# Patient Record
Sex: Female | Born: 1940 | Race: White | Hispanic: No | Marital: Married | Smoking: Never smoker
Health system: Southern US, Community
[De-identification: ages and names within clinical notes are randomized; demographics above are authoritative.]

## PROBLEM LIST (undated history)

## (undated) DIAGNOSIS — T8859XA Other complications of anesthesia, initial encounter: Secondary | ICD-10-CM

## (undated) DIAGNOSIS — A809 Acute poliomyelitis, unspecified: Secondary | ICD-10-CM

## (undated) DIAGNOSIS — E785 Hyperlipidemia, unspecified: Secondary | ICD-10-CM

## (undated) DIAGNOSIS — K5790 Diverticulosis of intestine, part unspecified, without perforation or abscess without bleeding: Secondary | ICD-10-CM

## (undated) DIAGNOSIS — H269 Unspecified cataract: Secondary | ICD-10-CM

## (undated) DIAGNOSIS — Z23 Encounter for immunization: Secondary | ICD-10-CM

## (undated) DIAGNOSIS — G4730 Sleep apnea, unspecified: Secondary | ICD-10-CM

## (undated) DIAGNOSIS — Z8489 Family history of other specified conditions: Secondary | ICD-10-CM

## (undated) DIAGNOSIS — K648 Other hemorrhoids: Secondary | ICD-10-CM

## (undated) DIAGNOSIS — K759 Inflammatory liver disease, unspecified: Secondary | ICD-10-CM

## (undated) DIAGNOSIS — M797 Fibromyalgia: Secondary | ICD-10-CM

## (undated) DIAGNOSIS — M1990 Unspecified osteoarthritis, unspecified site: Secondary | ICD-10-CM

## (undated) DIAGNOSIS — K219 Gastro-esophageal reflux disease without esophagitis: Secondary | ICD-10-CM

## (undated) DIAGNOSIS — E079 Disorder of thyroid, unspecified: Secondary | ICD-10-CM

## (undated) DIAGNOSIS — J189 Pneumonia, unspecified organism: Secondary | ICD-10-CM

## (undated) DIAGNOSIS — IMO0002 Reserved for concepts with insufficient information to code with codable children: Secondary | ICD-10-CM

## (undated) DIAGNOSIS — I2510 Atherosclerotic heart disease of native coronary artery without angina pectoris: Secondary | ICD-10-CM

## (undated) DIAGNOSIS — I959 Hypotension, unspecified: Secondary | ICD-10-CM

## (undated) DIAGNOSIS — G14 Postpolio syndrome: Secondary | ICD-10-CM

## (undated) DIAGNOSIS — G709 Myoneural disorder, unspecified: Secondary | ICD-10-CM

## (undated) DIAGNOSIS — F32A Depression, unspecified: Secondary | ICD-10-CM

## (undated) DIAGNOSIS — K529 Noninfective gastroenteritis and colitis, unspecified: Secondary | ICD-10-CM

## (undated) DIAGNOSIS — F329 Major depressive disorder, single episode, unspecified: Secondary | ICD-10-CM

## (undated) DIAGNOSIS — R55 Syncope and collapse: Secondary | ICD-10-CM

## (undated) DIAGNOSIS — I10 Essential (primary) hypertension: Secondary | ICD-10-CM

## (undated) DIAGNOSIS — Z5189 Encounter for other specified aftercare: Secondary | ICD-10-CM

## (undated) HISTORY — DX: Essential (primary) hypertension: I10

## (undated) HISTORY — PX: ABDOMINAL HYSTERECTOMY: SHX81

## (undated) HISTORY — PX: CHOLECYSTECTOMY: SHX55

## (undated) HISTORY — DX: Encounter for immunization: Z23

## (undated) HISTORY — DX: Unspecified cataract: H26.9

## (undated) HISTORY — PX: APPENDECTOMY: SHX54

## (undated) HISTORY — DX: Unspecified osteoarthritis, unspecified site: M19.90

## (undated) HISTORY — DX: Fibromyalgia: M79.7

## (undated) HISTORY — DX: Noninfective gastroenteritis and colitis, unspecified: K52.9

## (undated) HISTORY — DX: Myoneural disorder, unspecified: G70.9

## (undated) HISTORY — DX: Other hemorrhoids: K64.8

## (undated) HISTORY — DX: Syncope and collapse: R55

## (undated) HISTORY — DX: Diverticulosis of intestine, part unspecified, without perforation or abscess without bleeding: K57.90

## (undated) HISTORY — DX: Disorder of thyroid, unspecified: E07.9

## (undated) HISTORY — DX: Acute poliomyelitis, unspecified: A80.9

## (undated) HISTORY — DX: Gastro-esophageal reflux disease without esophagitis: K21.9

## (undated) HISTORY — DX: Hyperlipidemia, unspecified: E78.5

## (undated) HISTORY — DX: Depression, unspecified: F32.A

## (undated) HISTORY — PX: EYE SURGERY: SHX253

## (undated) HISTORY — DX: Reserved for concepts with insufficient information to code with codable children: IMO0002

## (undated) HISTORY — DX: Major depressive disorder, single episode, unspecified: F32.9

## (undated) HISTORY — DX: Encounter for other specified aftercare: Z51.89

---
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
## 2000-05-29 ENCOUNTER — Encounter: Payer: Self-pay | Admitting: Neurology

## 2000-05-29 ENCOUNTER — Ambulatory Visit (HOSPITAL_COMMUNITY): Admission: RE | Admit: 2000-05-29 | Discharge: 2000-05-29 | Payer: Self-pay | Admitting: Neurology

## 2000-06-11 ENCOUNTER — Encounter: Admission: RE | Admit: 2000-06-11 | Discharge: 2000-07-10 | Payer: Self-pay | Admitting: Neurology

## 2002-10-05 DIAGNOSIS — Z23 Encounter for immunization: Secondary | ICD-10-CM

## 2002-10-05 HISTORY — DX: Encounter for immunization: Z23

## 2002-10-06 ENCOUNTER — Emergency Department (HOSPITAL_COMMUNITY): Admission: EM | Admit: 2002-10-06 | Discharge: 2002-10-06 | Payer: Self-pay | Admitting: Emergency Medicine

## 2003-10-26 ENCOUNTER — Other Ambulatory Visit: Admission: RE | Admit: 2003-10-26 | Discharge: 2003-10-26 | Payer: Self-pay | Admitting: Family Medicine

## 2008-02-10 DIAGNOSIS — Z23 Encounter for immunization: Secondary | ICD-10-CM

## 2008-02-10 HISTORY — DX: Encounter for immunization: Z23

## 2009-01-02 DIAGNOSIS — Z23 Encounter for immunization: Secondary | ICD-10-CM

## 2009-01-02 HISTORY — DX: Encounter for immunization: Z23

## 2009-07-07 ENCOUNTER — Encounter: Admission: RE | Admit: 2009-07-07 | Discharge: 2009-07-07 | Payer: Self-pay | Admitting: Family Medicine

## 2009-10-07 ENCOUNTER — Ambulatory Visit: Payer: Self-pay | Admitting: Internal Medicine

## 2009-10-07 DIAGNOSIS — G4733 Obstructive sleep apnea (adult) (pediatric): Secondary | ICD-10-CM | POA: Insufficient documentation

## 2009-10-07 DIAGNOSIS — B91 Sequelae of poliomyelitis: Secondary | ICD-10-CM | POA: Insufficient documentation

## 2009-10-07 DIAGNOSIS — I10 Essential (primary) hypertension: Secondary | ICD-10-CM | POA: Insufficient documentation

## 2009-10-07 DIAGNOSIS — R05 Cough: Secondary | ICD-10-CM

## 2009-10-07 DIAGNOSIS — K219 Gastro-esophageal reflux disease without esophagitis: Secondary | ICD-10-CM | POA: Insufficient documentation

## 2009-10-07 DIAGNOSIS — R059 Cough, unspecified: Secondary | ICD-10-CM | POA: Insufficient documentation

## 2009-10-07 DIAGNOSIS — M810 Age-related osteoporosis without current pathological fracture: Secondary | ICD-10-CM | POA: Insufficient documentation

## 2009-10-11 ENCOUNTER — Telehealth (INDEPENDENT_AMBULATORY_CARE_PROVIDER_SITE_OTHER): Payer: Self-pay | Admitting: *Deleted

## 2009-12-10 ENCOUNTER — Ambulatory Visit: Payer: Self-pay | Admitting: Internal Medicine

## 2010-01-18 ENCOUNTER — Ambulatory Visit: Payer: Self-pay | Admitting: Internal Medicine

## 2010-01-21 LAB — CONVERTED CEMR LAB: IgE (Immunoglobulin E), Serum: 6.8 intl units/mL (ref 0.0–180.0)

## 2010-01-26 ENCOUNTER — Ambulatory Visit: Payer: Self-pay | Admitting: Internal Medicine

## 2010-02-16 ENCOUNTER — Ambulatory Visit: Payer: Self-pay | Admitting: Internal Medicine

## 2010-10-04 NOTE — Assessment & Plan Note (Signed)
Summary: Pulmonary/  f/u cough try neurontin    Copy to:  Dr. Elias Else Primary Provider/Referring Provider:  Dr. Azucena Cecil  CC:  Pt here for follow up with PFT. Pt states breathing is better. Pt c/o increased PND x 3 to 4 weeks. Pt c/o having to get up 2 to 3 times a night due to PND and usually takes up to 30 minutes every morning to clear all PND.  History of Present Illness: 33 yowf never smoker with no h/o any sign upper or lower resp problems until  new onset "allergies" x 2004 consisting on sensation of constant drainage eval by [facility] [place] and found to be allergic to dust and mold and rx with multipe meds including systemic steroids with best longterm parital response to  rhinocort and astelin.  October 07, 2009 initial pulmonary eval  cc much worse x 1 year with sensation of post nasal drainage with recent neg ENT wu but now worse  to point of choking and lost voice completely 1 week prior to ov.  no better with rhinocort or astepro now. assoc with dry cough and mild sore throat/ dysphagia. rec Stop lisinopril and triamterenehctz and take 1 diovan 160/112.5 daily  in it's place Acid reflux is a  leading suspect here and needs to be eliminated  completely before considering additional studies or treatment options. To suppress this maximally, take prilosec otc before  meal and pepcid 20 mg (otc) at bedtime plus diet measures as listed but it's ok to stop the acid preventers when 100% if not convinced you're 100% by the time you finish diovan return here otherwise see Dr Nicholos Johns. Prednisone 10 mg 4 each am x 2days, 2x2days, 1x2days and stop  December 10, 2009 cc Followup dyspnea. Pt states that her breathing is much better since last seen. She states that she sometimes still gets some SOB with exertion- sometimes without any exertion.  She also also PND that wakes her up in the night.  Hoarseness much better also. Prilosec 20 mg before first and last meal and pepcid 20 mg (otc) at bedtime plus  diet measures as listed.  To treat drainage, the best medication is chlortrimeton 4mg  one 6hours as neded   use rhinocort every night whether you think you need it or not   Jan 18, 2010 Pt here for follow up with PFT. Pt states breathing is better.  still pnd hs mostly. no discolored mucus. was already told all that was left to offer was sinus surgery and declined. Pt denies any significant sore throat, dysphagia, itching, sneezing.  Current Medications (verified): 1)  Levothroid 112 Mcg Tabs (Levothyroxine Sodium) .Marland Kitchen.. 1 Once Daily 2)  Caltrate 600+d 600-400 Mg-Unit Tabs (Calcium Carbonate-Vitamin D) .Marland Kitchen.. 1 Once Daily 3)  Diovan Hct 160-12.5 Mg  Tabs (Valsartan-Hydrochlorothiazide) .... One By Mouth Daily 4)  Prilosec Otc 20 Mg Tbec (Omeprazole Magnesium) .... Take One 30-60 Min Before First and Last Meals of The Day 5)  Advil 200 Mg Tabs (Ibuprofen) .... As Directed As Needed 6)  Sertraline Hcl 50 Mg Tabs (Sertraline Hcl) .Marland Kitchen.. 1 Once Daily 7)  Pepcid 20 Mg Tabs (Famotidine) .... Take 1 Tab By Mouth At Bedtime 8)  Rhinocort Aqua 32 Mcg/act Susp (Budesonide) .... One Spray Each Nostril Twice Daily 9)  Astelin 137 Mcg/spray Soln (Azelastine Hcl) .... One Spray in Each Nostril Twice Daily 10)  Aspirin 81 Mg  Tabs (Aspirin) .... Take One Tablet By Mouth Every Other Day (3 Times A  Week)  Allergies (verified): 1)  ! Cipro 2)  ! * Ivp Dye 3)  ! * Claritin D 4)  ! 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) OSTEOPOROSIS (ICD-733.00) HYPERTENSION (ICD-401.9)     - On lisinopril since 2004 >  d/c October 07, 2009 > much better December 11, 2009  G E R D (ICD-530.81)......................................................Marland KitchenMarcy Panning       - EGD positive 2010   Vital Signs:  Patient profile:   70 year old female Height:      64 inches Weight:      141 pounds O2 Sat:      98 % on Room air Temp:     98.0 degrees F  oral Pulse rate:   77 / minute BP sitting:   150 / 88  (left arm) Cuff size:   regular  Vitals Entered By: Zackery Barefoot CMA (Jan 18, 2010 9:44 AM)  O2 Flow:  Room air CC: Pt here for follow up with PFT. Pt states breathing is better. Pt c/o increased PND x 3 to 4 weeks. Pt c/o having to get up 2 to 3 times a night due to PND and usually takes up to 30 minutes every morning to clear all PND Comments Medications reviewed with patient Verified contact number and pharmacy with patient Zackery Barefoot CMA  Jan 18, 2010 9:44 AM    Physical Exam  Additional Exam:   amb wf nad  no longer with classic pseudowheeze, minimal hoarseness HEENT: nl dentition, turbinates, and orophanx. Nl external ear canals without cough reflex NECK :  without JVD/Nodes/TM/ nl carotid upstrokes bilaterally LUNGS: no acc muscle use, clear to A and P bilaterally without cough on insp or exp maneuvers CV:  RRR  no s3 or murmur or increase in P2, no edema   ABD:  soft and nontender with nl excursion in the supine position. No bruits or organomegaly, bowel sounds nl MS:  warm without deformities, calf tenderness, cyanosis or clubbing      Impression & Recommendations:  Problem # 1:  COUGH (ICD-786.2) Much better off ace is strongly suggestive of  Upper airway cough syndrome, so named because it's frequently impossible to sort out how much is LPR vs CR/sinusitis with freq throat clearing generating secondary extra esophageal GERD from wide swings in gastric pressure that occur with throat clearing, promoting self use of mint and menthol lozenges that reduce the lower esophageal sphincter tone and exacerbate the problem further.  These symptoms are easily confused with asthma/copd by even experienced pulmonogists because they overlap so much. These are the same pts who not infrequently have failed to tolerate ace inhibitors,  dry powder inhalers or biphosphonates or report having reflux symptoms that don't respond to  standard doses of PPI.  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  I had an extended discussion with the patient today lasting 15 to 20 minutes of a 25 minute visit on the following issues:  The standardized cough guidelines recently published in Chest are a 14 step process, not a single office visit,  and are intended  to address this problem logically,  with an alogrithm dependent on response to each progressive step  to determine a specific diagnosis with  minimal addtional testing needed. Therefore if compliance is an issue this empiric standardized approach  simply won't work.   Each maintenance medication was reviewed in detail including most importantly the difference between maintenance and as needed and under what circumstances the prns are to be used. See instructions for specific recommendations   Medications Added to Medication List This Visit: 1)  Pepcid 20 Mg Tabs (Famotidine) .... Take 1 tab by mouth at bedtime 2)  Rhinocort Aqua 32 Mcg/act Susp (Budesonide) .... One spray each nostril twice daily 3)  Astelin 137 Mcg/spray Soln (Azelastine hcl) .... One spray in each nostril twice daily 4)  Aspirin 81 Mg Tabs (Aspirin) .... Take one tablet by mouth every other day (3 times a week) 5)  Neurontin 400 Mg Caps (Gabapentin) .... One in evening  Other Orders: [facility]-All[facility] Profile [facility], [place], [place], [place], [place] [phone]) Misc. Referral (Misc. Ref) Prescription Created Electronically [phone]) Est. Patient Level IV (40981)  Patient Instructions: 1)  Try neurontin 400 mg each evening 2)  No change in any of your other medication 3)  Please schedule a follow-up appointment in 4weeks, sooner if needed  4)  See Patient Care Coordinator before leaving for sinus ct Prescriptions: NEURONTIN 400 MG CAPS (GABAPENTIN) one in evening  #30 x 11   Entered and Authorized by:    Nyoka Cowden MD   Signed by:   Nyoka Cowden MD on 01/18/2010   Method used:   Electronically to        [facility]* (retail)       [address] [place], [place]  [address]       Ph: [phone] or [phone]       Fax: [phone]   RxID:   [id number]    Immunization History:  Influenza Immunization History:    Influenza:  historical (06/07/2009)

## 2010-10-04 NOTE — Assessment & Plan Note (Signed)
Summary: Pulmonary/ ext f/u ov for cough   Copy to:  Dr. Elias Else Primary Provider/Referring Provider:  Dr. Azucena Cecil  CC:  4 wk followup.  Pt states that her cough has improved some- not coughing quite as often.  She still has "harsh cough" at times- non prod.  No new complaints today.  She is only taking prilosec and pepcid prn because she states that it does not make any difference.Marland Kitchen  History of Present Illness: 32 yowf never smoker with no h/o any sign upper or lower resp problems until  new onset "allergies" x 2004 consisting on sensation of constant drainage eval by Gene [place] and found to be allergic to dust and mold and rx with multipe meds including systemic steroids with best longterm parital response to  rhinocort and astelin.  October 07, 2009 initial pulmonary eval  cc much worse x 1 year with sensation of post nasal drainage with recent neg ENT wu but now worse  to point of choking and lost voice completely 1 week prior to ov.  no better with rhinocort or astepro now. assoc with dry cough and mild sore throat/ dysphagia. rec Stop lisinopril and triamterenehctz and take 1 diovan 160/112.5 daily  in it's place Acid reflux is a  leading suspect here and needs to be eliminated  completely before considering additional studies or treatment options. To suppress this maximally, take prilosec otc before  meal and pepcid 20 mg (otc) at bedtime plus diet measures as listed but it's ok to stop the acid preventers when 100% if not convinced you're 100% by the time you finish diovan return here otherwise see Dr Nicholos Johns. Prednisone 10 mg 4 each am x 2days, 2x2days, 1x2days and stop  December 10, 2009 cc Followup dyspnea. Pt states that her breathing is much better since last seen. She states that she sometimes still gets some SOB with exertion- sometimes without any exertion.  She also also PND that wakes her up in the night.  Hoarseness much better also. Prilosec 20 mg before first and last meal and  pepcid 20 mg (otc) at bedtime plus diet measures as listed.  To treat drainage, the best medication is chlortrimeton 4mg  one 6hours as neded   use rhinocort every night whether you think you need it or not   Jan 18, 2010 Pt here for follow up with PFT.  rec try neurontin couldn't take it due to funny in head  February 16, 2010 4 wk followup.  Pt states that her cough has improved some- not coughing quite as often.  She still has "harsh cough" at times- non prod.  No new c/os.   Cough occurs about hour and half after lie down now waiting until it occurs to take rhinocort. not compliant with previous rec.  Pt denies any significant sore throat, dysphagia, itching, sneezing,    fever, chills, sweats, unintended wt loss, pleuritic or exertional cp, hempoptysis, change in activity tolerance  orthopnea pnd or leg swelling .  Pt also denies any obvious fluctuation in symptoms with weather or environmental change or other alleviating or aggravating factors.       Current Medications (verified): 1)  Levothroid 112 Mcg Tabs (Levothyroxine Sodium) .Marland Kitchen.. 1 Once Daily 2)  Caltrate 600+d 600-400 Mg-Unit Tabs (Calcium Carbonate-Vitamin D) .Marland Kitchen.. 1 Once Daily 3)  Diovan Hct 160-12.5 Mg  Tabs (Valsartan-Hydrochlorothiazide) .... One By Mouth Daily 4)  Prilosec Otc 20 Mg Tbec (Omeprazole Magnesium) .... Take One 30-60 Min Before First and  Last Meals of The Day As Needed 5)  Sertraline Hcl 50 Mg Tabs (Sertraline Hcl) .Marland Kitchen.. 1 Once Daily 6)  Pepcid 20 Mg Tabs (Famotidine) .... Take 1 Tab By Mouth At Bedtime As Needed 7)  Rhinocort Aqua 32 Mcg/act Susp (Budesonide) .... One Spray Each Nostril Twice Daily 8)  Astelin 137 Mcg/spray Soln (Azelastine Hcl) .... One Spray in Each Nostril Twice Daily 9)  Aspirin 81 Mg  Tabs (Aspirin) .... Take One Tablet By Mouth Every Other Day (3 Times A Week) 10)  Advil 200 Mg Tabs (Ibuprofen) .... As Directed As Needed  Allergies (verified): 1)  ! Cipro 2)  ! * Ivp Dye 3)  ! * Claritin  D 4)  ! Prednisone  Vital Signs:  Patient profile:   70 year old female Weight:      140 pounds O2 Sat:      99 % on Room air Temp:     97.7 degrees F oral Pulse rate:   75 / minute BP sitting:   130 / 78  (left arm)  Vitals Entered By: Vernie Murders (February 16, 2010 10:07 AM)  O2 Flow:  Room air CC: 4 wk followup.  Pt states that her cough has improved some- not coughing quite as often.  She still has "harsh cough" at times- non prod.  No new complaints today.  She is only taking prilosec and pepcid prn because she states that it does not make any difference.   Physical Exam  Additional Exam:   amb wf nad  no longer with classic pseudowheeze, minimal hoarseness wt 141 > 140 February 17, 2010  HEENT: nl dentition, turbinates, and orophanx. Nl external ear canals without cough reflex NECK :  without JVD/Nodes/TM/ nl carotid upstrokes bilaterally LUNGS: no acc muscle use, clear to A and P bilaterally without cough on insp or exp maneuvers CV:  RRR  no s3 or murmur or increase in P2, no edema   ABD:  soft and nontender with nl excursion in the supine position. No bruits or organomegaly, bowel sounds nl MS:  warm without deformities, calf tenderness, cyanosis or clubbing      Impression & Recommendations:  Problem # 1:  COUGH (ICD-786.2)  The most common causes of chronic cough in immunocompetent adults include: upper airway cough syndrome (UACS), previously referred to as postnasal drip syndrome,  caused by variety of rhinosinus conditions; (2) asthma; (3) GERD; (4) chronic bronchitis from cigarette smoking or other inhaled environmental irritants; (5) nonasthmatic eosinophilic bronchitis; and (6) bronchiectasis. These conditions, singly or in combination, have accounted for up to 94% of the causes of chronic cough in prospective studies.    I had an extended discussion with the patient today lasting 15 to 20 minutes of a 25 minute visit on the following issues:   The standardized  cough guidelines recently published in Chest are a 14 step process, not a single office visit,  and are intended  to address this problem logically,  with an alogrithm dependent on response to each progressive step  to determine a specific diagnosis with  minimal addtional testing needed. Therefore if compliance is an issue this empiric standardized approach simply won't work.   Need to regroup re aggressive rx directed at uacs.  See instructions for specific recommendations   Orders: Est. Patient Level IV (14782)  Medications Added to Medication List This Visit: 1)  Prilosec Otc 20 Mg Tbec (Omeprazole magnesium) .... Take one 30-60 min before first and last meals of  the day as needed 2)  Pepcid 20 Mg Tabs (Famotidine) .... Take 1 tab by mouth at bedtime as needed 3)  Medrol 8 Mg Tabs (Methylprednisolone) .... 4 each am x 2days, 2x2days, 1x2days and stop 4)  Neurontin 100 Mg Caps (Gabapentin) .... One twice daily  Patient Instructions: 1)  Restart Prilosec 20 mg Take one 30-60 min before first and last meals of the day and pepcid at bedime along with chlortrimeton 4 mg at bedtime 2)  Add neurontin 100 mg twice daily 3)  Medrol x 6 days only 4)  Continue Astelin at bedtime and astelin at bedtime 5)  Please schedule a follow-up appointment in 4 weeks, sooner if needed  6)  Unlike when you get a prescription for eyeglasses, it's not possible to always walk out of this or any medical office with a perfect prescription that is immediately effective  based on any test that we offer here.  On the contrary, it may take several weeks for the full impact of changes recommened today - hopefully you will respond well.  If not, then we'll adjust your medication on your next visit accordingly, knowing more then than we can possibly know now.    Prescriptions: NEURONTIN 100 MG CAPS (GABAPENTIN) one twice daily  #60 x 0   Entered and Authorized by:   Nyoka Cowden MD   Signed by:   Nyoka Cowden MD on  02/16/2010   Method used:   Electronically to        [facility]* (retail)       [address] [place], [place]  [address]       Ph: [phone] or [phone]       Fax: [phone]   RxID:   [id number] MEDROL 8 MG TABS (METHYLPREDNISOLONE) 4 each am x 2days, 2x2days, 1x2days and stop  #14 x 0   Entered and Authorized by:   Nyoka Cowden MD   Signed by:   Nyoka Cowden MD on 02/16/2010   Method used:   Electronically to        [facility]* (retail)       [address] [place], [place]  [address]       Ph: [phone] or [phone]       Fax: [phone]   RxID:   [id number]

## 2010-10-04 NOTE — Assessment & Plan Note (Signed)
Summary: Pulmonary/ ext f/u ov   Copy to:  Dr. Elias Else Primary Provider/Referring Provider:  Dr. Azucena Cecil  CC:  Followup dyspnea. Pt states that her breathing is much better since last seen. She states that she sometimes still gets some SOB with exertion- sometimes without any exertion.  She also also PND that wakes her up in the night.  .  History of Present Illness: 56 yowf never smoker with no h/o any sign upper or lower resp problmes until  new onset "allergies" x 2004 consisting on sensation of constant drainage eval by Gene [place] and found to be allergic to dust and mold and rx with multipe meds including systemic steroids with best longterm parital response to  rhinocort and astelin.  October 07, 2009 initial pulmonary eval  cc much worse x 1 year with sensation of post nasal drainage with recent neg ENT wu but now worse  to point of choking and lost voice completely 1 week prior to ov.  no better with rhinocort or astepro now. assoc with dry cough and mild sore throat/ dysphagia. rec Stop lisinopril and triamterenehctz and take 1 diovan 160/112.5 daily  in it's place Acid reflux is a  leading suspect here and needs to be eliminated  completely before considering additional studies or treatment options. To suppress this maximally, take prilosec otc before  meal and pepcid 20 mg (otc) at bedtime plus diet measures as listed but it's ok to stop the acid preventers when 100% if not convinced you're 100% by the time you finish diovan return here otherwise see Dr Nicholos Johns. Prednisone 10 mg 4 each am x 2days, 2x2days, 1x2days and stop  December 10, 2009 cc Followup dyspnea. Pt states that her breathing is much better since last seen. She states that she sometimes still gets some SOB with exertion- sometimes without any exertion.  She also also PND that wakes her up in the night.  Hoarseness much better also.  Pt denies any significant sore throat, dysphagia, itching, sneezing,   fever, chills,  sweats, unintended wt loss, pleuritic or exertional cp, hempoptysis, change in activity tolerance  orthopnea pnd or leg swelling.  Pt also denies any obvious fluctuation in symptoms with weather or environmental change or other alleviating or aggravating factors.       Current Medications (verified): 1)  Levothroid 112 Mcg Tabs (Levothyroxine Sodium) .Marland Kitchen.. 1 Once Daily 2)  Caltrate 600+d 600-400 Mg-Unit Tabs (Calcium Carbonate-Vitamin D) .Marland Kitchen.. 1 Once Daily 3)  Alprazolam 0.5 Mg Tabs (Alprazolam) .... 1/2 To 1 At Bedtime As Needed 4)  Diovan Hct 160-12.5 Mg  Tabs (Valsartan-Hydrochlorothiazide) .... One By Mouth Daily 5)  Prilosec Otc 20 Mg Tbec (Omeprazole Magnesium) .... Take  One 30-60 Min Before First Meal of The Day 6)  Advil 200 Mg Tabs (Ibuprofen) .... As Directed As Needed 7)  Sertraline Hcl 50 Mg Tabs (Sertraline Hcl) .Marland Kitchen.. 1 Once Daily  Allergies (verified): 1)  ! Cipro 2)  ! * Ivp Dye 3)  ! 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 Nl external ear canals without cough reflex NECK :  without JVD/Nodes/TM/ nl carotid upstrokes bilaterally LUNGS: no acc muscle use, clear to A and P bilaterally without cough on insp or exp maneuvers CV:  RRR  no s3 or murmur or increase in P2, no edema   ABD:  soft and nontender  with nl excursion in the supine position. No bruits or organomegaly, bowel sounds nl MS:  warm without deformities, calf tenderness, cyanosis or clubbing      Impression & Recommendations:  Problem # 1:  COUGH (ICD-786.2)  Much better off ace is strongly suggestive of  Upper airway cough syndrome, so named because it's frequently impossible to sort out how much is LPR vs CR/sinusitis with freq throat clearing generating secondary extra esophageal GERD from wide swings in gastric pressure that occur with throat clearing, promoting self use of mint and menthol lozenges that reduce the lower esophageal sphincter tone and exacerbate the problem further.  These symptoms are easily confused with asthma/copd by even experienced pulmonogists because they overlap so much. These are the same pts who not infrequently have failed to tolerate ace inhibitors,  dry powder inhalers or biphosphonates or report having reflux symptoms that don't respond to standard doses of PPI.  For now continue maximum rx for gerd and use 1st gen H1 for sensation of pnds.  NB the  ramp to expected improvement (and for that matter, worsening, if a chronic effective medication is stopped)  can be measured in weeks, not days, a common misconception because this is not Heartburn with no immediate cause and effect relationship so that response to therapy or lack thereof can be very difficult to assess.   Orders: Est. 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 Patient Level IV (66063)  Medications Added to Medication List This Visit: 1)  Prilosec Otc 20 Mg Tbec (Omeprazole magnesium) .... Take one 30-60 min before first and last meals of the day 2)  Advil 200 Mg Tabs (Ibuprofen) .... As directed as needed 3)  Sertraline Hcl 50 Mg Tabs (Sertraline hcl) .Marland Kitchen.. 1 once daily  Patient Instructions: 1)  Acid reflux is a  leading suspect here and needs to be eliminated   completely before considering additional studies or treatment options. To suppress this maximally, take Prilosec 20 mg before first and last meal and pepcid 20 mg (otc) at bedtime plus diet measures as listed.  2)  To treat drainage, the best medication is chlortrimeton 4mg  one 6hours as neded   3)  Please schedule a follow-up appointment in 6 weeks, sooner if needed with pft's 4)  use rhinocort every night whether you think you need it or not. 5)    Think of your medications in 3 separate categories and keep them separate:  6)  1)  The ones you take no matter what daily on a scheduled basis (prilosec, pepcid, nasonex, diovan) 7)  2)  The ones you only take if needed for specific problems (chortrimeton and astelin)  8)  3)  The ones you take for a short  course and stop, like antibiotics and prednisone. 9)

## 2010-10-04 NOTE — Assessment & Plan Note (Signed)
Summary: Pulmonary/ uacs ? all ace ?   Visit Type:  Initial Consult Copy to:  Dr. Elias Else Primary Provider/Referring Provider:  Dr. Azucena Cecil  CC:  Dyspnea.  History of Present Illness: 70 yowf never smoker with no h/o any sign upper or lower resp problmes until  new onset "allergies" x 2004 consisting on sensation of constant drainage eval by Gene Hulbert and found to be allergic to dust and mold and rx with multipe meds including systemic steroids with best longterm parital response to  rhinocort and astelin.  October 07, 2009 initial pulmonary eval  cc much worse x 1 year with sensation of post nasal drainage with recent neg ENT wu but now worse  to point of choking and lost voice completely 1 week prior to ov.  no better with rhinocort or astepro now. assoc with dry cough and mild sore throat/ dysphagia.  Pt denies any significant  itching, sneezing,  nasal congestion or excess secretions,  fever, chills, sweats, unintended wt loss, pleuritic or exertional cp, hempoptysis,   orthopnea pnd or leg swelling  - panicks at night when choking sensation is the worst. Pt also denies any obvious fluctuation in symptoms with weather or environmental change or other alleviating or aggravating factors.       Current Medications (verified): 1)  Levothroid 112 Mcg Tabs (Levothyroxine Sodium) .Marland Kitchen.. 1 Once Daily 2)  Lisinopril 2.5 Mg Tabs (Lisinopril) .Marland Kitchen.. 1 Once Daily 3)  Triamterene-Hctz 37.5-25 Mg Tabs (Triamterene-Hctz) .Marland Kitchen.. 1 Once Daily 4)  Rhinocort Aqua 32 Mcg/act Susp (Budesonide) .... 2 Sprays Each Nostril Once Daily 5)  Caltrate 600+d 600-400 Mg-Unit Tabs (Calcium Carbonate-Vitamin D) .Marland Kitchen.. 1 Once Daily 6)  Alprazolam 0.5 Mg Tabs (Alprazolam) .... 1/2 To 1 At Bedtime As Needed  Allergies (verified): 1)  ! Cipro 2)  ! * Ivp Dye 3)  ! 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) OSTEOPOROSIS  (ICD-733.00) HYPERTENSION (ICD-401.9)     - On lisinopril since 2004 >  d/c October 07, 2009  G E R D (ICD-530.81)  Past Surgical History: Cholecystectomy 1990 Hysterectomy Appendectomy 1967  Family History: Heart dz- PGM, PGF, and Mother Lung CA- Sister (smoker)  Social History: Married Never smoker Retired Runner, broadcasting/film/video  Review of Systems       The patient complains of shortness of breath with activity, shortness of breath at rest, non-productive cough, irregular heartbeats, acid heartburn, indigestion, difficulty swallowing, nasal congestion/difficulty breathing through nose, anxiety, and change in color of mucus.  The patient denies productive cough, coughing up blood, chest pain, loss of appetite, weight change, abdominal pain, sore throat, tooth/dental problems, headaches, sneezing, itching, ear ache, depression, hand/feet swelling, joint stiffness or pain, rash, and fever.    Vital Signs:  Patient profile:   70 year old female Height:      64 inches Weight:      134 pounds BMI:     23.08 O2 Sat:      98 % on Room air Temp:     97.6 degrees F oral Pulse rate:   63 / minute BP sitting:   130 / 80  (left arm)  Vitals Entered By: Vernie Murders (October 07, 2009 11:43 AM)  O2 Flow:  Room air  Physical Exam  Additional Exam:  anxious extremely hoarse amb wf nad with classic pseudowheeze resolves with purse lip maneuver HEENT: nl dentition, turbinates, and orophanx. Nl external ear canals without cough  reflex NECK :  without JVD/Nodes/TM/ nl carotid upstrokes bilaterally LUNGS: no acc muscle use, clear to A and P bilaterally without cough on insp or exp maneuvers CV:  RRR  no s3 or murmur or increase in P2, no edema   ABD:  soft and nontender with nl excursion in the supine position. No bruits or organomegaly, bowel sounds nl MS:  warm without deformities, calf tenderness, cyanosis or clubbing SKIN: warm and dry without lesions   NEURO:  alert, approp, no deficits      CXR  Procedure date:  09/12/2009  Findings:      mild hyperinflation  Impression & Recommendations:  Problem # 1:  COUGH (ICD-786.2)  In retrospect this is most c/w Upper airway cough syndrome, so named because it's frequently impossible to sort out how much is LPR vs CR/sinusitis with freq throat clearing generating secondary extra esophageal GERD from wide swings in gastric pressure that occur with throat clearing, promoting self use of mint and menthol lozenges that reduce the lower esophageal sphincter tone and exacerbate the problem further.  These symptoms are easily confused with asthma/copd by even experienced pulmonogists because they overlap so much. These are the same pts who not infrequently have failed to tolerate ace inhibitors,  dry powder inhalers or biphosphonates or report having reflux symptoms that don't respond to standard doses of PPI  therefore stop ace and rx gerd for up to a month before considering any further w/u  Orders: New Patient Level V (16109)  Problem # 2:  HYPERTENSION (ICD-401.9)  The following medications were removed from the medication list:    Lisinopril 2.5 Mg Tabs (Lisinopril) .Marland Kitchen... 1 once daily    Triamterene-hctz 37.5-25 Mg Tabs (Triamterene-hctz) .Marland Kitchen... 1 once daily Her updated medication list for this problem includes:    Diovan Hct 160-12.5 Mg Tabs (Valsartan-hydrochlorothiazide) ..... One by mouth daily  ACE inhibitors are problematic in  pts with airway complaints because  even experienced pulmononlogists allergists and ENT docs (the latter two applying in her case)  can't always distinguish ace effects from copd/asthma.  By themselves they don't actually cause a problem, much like oxygen can't by itself start a fire, but they certainly serve as a powerful catalyst or enhancer for any "fire"  or inflammatory process in the upper airway, be it caused by an ET  tube or more commonly reflux (especially in the obese or pts with known GERD or  who are on biphoshonates).  In the era of ARB near equivalency until we have a better handle on the reversibility of the airway problem, it just makes sense to avoid ace entirely in the short run and then decide later, having established a level of airway control using a reasonable limited regimen, whether to add back ace but even then being very careful to observe the pt for worsening airway control and number of meds used/ needed to control symptoms.    Orders: New Patient Level V ([address])  Medications Added to Medication List This Visit: 1)  Levothroid 112 Mcg Tabs (Levothyroxine sodium) .Marland Kitchen.. 1 once daily 2)  Lisinopril 2.5 Mg Tabs (Lisinopril) .Marland Kitchen.. 1 once daily 3)  Triamterene-hctz 37.5-25 Mg Tabs (Triamterene-hctz) .Marland Kitchen.. 1 once daily 4)  Rhinocort Aqua 32 Mcg/act Susp (Budesonide) .... 2 sprays each nostril once daily 5)  Caltrate 600+d 600-400 Mg-unit Tabs (Calcium carbonate-vitamin d) .Marland Kitchen.. 1 once daily 6)  Alprazolam 0.5 Mg Tabs (Alprazolam) .... 1/2 to 1 at bedtime as needed 7)  Diovan Hct 160-12.5 Mg  Tabs (Valsartan-hydrochlorothiazide) .... One by mouth daily 8)  Prednisone 10 Mg Tabs (Prednisone) .... 4 each am x 2days, 2x2days, 1x2days and stop 9)  Prilosec Otc 20 Mg Tbec (Omeprazole magnesium) .... Take  one 30-60 min before first meal of the day 10)  Pepcid Ac Maximum Strength 20 Mg Tabs (Famotidine) .... One at bedtime  Patient Instructions: 1)  Stop lisinopril and triamterenehctz and take 1 diovan 160/112.5 daily  in it's place 2)  Acid reflux is a  leading suspect here and needs to be eliminated  completely before considering additional studies or treatment options. To suppress this maximally, take prilosec otc before  meal and pepcid 20 mg (otc) at bedtime plus diet measures as listed but it's ok to stop the acid preventers when 100% 3)  if not convinced you're 100% by the time you finish diovan return here otherwise see Dr Nicholos Johns. 4)  GERD (REFLUX)  is a common cause of  respiratory symptoms. It commonly presents without heartburn and can be treated with medication, but also with lifestyle changes including avoidance of late meals, excessive alcohol, smoking cessation, and avoid fatty foods, chocolate, peppermint, colas, red wine, and acidic juices such as orange juice. NO MINT OR MENTHOL PRODUCTS SO NO COUGH DROPS  5)  USE SUGARLESS CANDY INSTEAD (jolley ranchers)  6)  NO OIL BASED VITAMINS  7)  Prednisone 10 mg 4 each am x 2days, 2x2days, 1x2days and stop  Prescriptions: PREDNISONE 10 MG  TABS (PREDNISONE) 4 each am x 2days, 2x2days, 1x2days and stop  #14 x 0   Entered and Authorized by:   Nyoka Cowden MD   Signed by:   Nyoka Cowden MD on 10/07/2009   Method used:   Electronically to        [facility]* (retail)       [address] [place], [place]  [address]       Ph: [phone] or [phone]       Fax: [phone]   RxID:   [id number]

## 2010-10-04 NOTE — Miscellaneous (Signed)
Summary: Orders Update pft charges  Clinical Lists Changes  Orders: Added new Service order of Carbon Monoxide diffusing w/capacity (94720) - Signed Added new Service order of Lung Volumes (94240) - Signed Added new Service order of Spirometry (Pre & Post) (94060) - Signed 

## 2010-10-04 NOTE — Progress Notes (Signed)
Summary: rx - reaction to prednisone, ok to d/c  Phone Note Call from Patient Call back at Home Phone [phone]   Caller: Spouse Call For: wert Reason for Call: Talk to Nurse Summary of Call: pt having drainage problem, trouble breathing, on prednisone taper and can't sleep.  Having anxiety at night as her breathing gets worse.  Says prednisone feels like she is going out of her mind.  Can she stop prednisone?  Has tried xanaex and it doesn't help.  Please advise. Initial call taken by: Eugene Gavia,  October 11, 2009 8:57 AM  Follow-up for Phone Call        The pt's spouse says the pt is breathing better on Prednisone but is unable to continue on it due to not sleeping and she feels like her skin is crawling. She took 2 Tylenol PM's last night and still could not sleep and xanax is not helping with the anxiety. The patient would like to stop the prednisone. Please advise. Michel Bickers [facility]  October 11, 2009 10:29 AM fine with me Follow-up by: Nyoka Cowden MD,  October 11, 2009 10:30 AM  Additional Follow-up for Phone Call Additional follow up Details #1::        Patients spouse had verbal understanding that it is ok for patient to stop the prednisone. They will call if she has any further problems or questions. Additional Follow-up by: Michel Bickers CMA,  October 11, 2009 11:23 AM

## 2011-02-14 ENCOUNTER — Other Ambulatory Visit: Payer: Self-pay | Admitting: Neurology

## 2011-02-14 ENCOUNTER — Ambulatory Visit
Admission: RE | Admit: 2011-02-14 | Discharge: 2011-02-14 | Disposition: A | Discharge status: Home / Self Care | Payer: Medicare Other | Source: Ambulatory Visit | Attending: Neurology | Admitting: Neurology

## 2011-02-14 DIAGNOSIS — M25559 Pain in unspecified hip: Secondary | ICD-10-CM

## 2011-12-29 ENCOUNTER — Encounter (INDEPENDENT_AMBULATORY_CARE_PROVIDER_SITE_OTHER): Payer: Medicare Other | Admitting: Ophthalmology

## 2011-12-29 DIAGNOSIS — H35039 Hypertensive retinopathy, unspecified eye: Secondary | ICD-10-CM

## 2011-12-29 DIAGNOSIS — H43819 Vitreous degeneration, unspecified eye: Secondary | ICD-10-CM

## 2011-12-29 DIAGNOSIS — I10 Essential (primary) hypertension: Secondary | ICD-10-CM

## 2012-03-19 ENCOUNTER — Other Ambulatory Visit: Payer: Self-pay | Admitting: Family Medicine

## 2012-03-19 ENCOUNTER — Ambulatory Visit
Admission: RE | Admit: 2012-03-19 | Discharge: 2012-03-19 | Disposition: A | Discharge status: Home / Self Care | Payer: Medicare Other | Source: Ambulatory Visit | Attending: Family Medicine | Admitting: Family Medicine

## 2012-03-19 DIAGNOSIS — R053 Chronic cough: Secondary | ICD-10-CM

## 2012-03-19 DIAGNOSIS — R05 Cough: Secondary | ICD-10-CM

## 2012-11-15 ENCOUNTER — Ambulatory Visit (INDEPENDENT_AMBULATORY_CARE_PROVIDER_SITE_OTHER): Payer: Medicare Other | Admitting: Family Medicine

## 2012-11-15 VITALS — BP 124/66 | HR 62 | Temp 98.1°F | Resp 16 | Ht 63.5 in | Wt 139.0 lb

## 2012-11-15 DIAGNOSIS — S91109A Unspecified open wound of unspecified toe(s) without damage to nail, initial encounter: Secondary | ICD-10-CM

## 2012-11-15 DIAGNOSIS — S91209A Unspecified open wound of unspecified toe(s) with damage to nail, initial encounter: Secondary | ICD-10-CM

## 2012-11-15 DIAGNOSIS — M79609 Pain in unspecified limb: Secondary | ICD-10-CM

## 2012-11-15 NOTE — Progress Notes (Signed)
72 year old woman who comes in with pain following injury to right great toe. She dropped a weight on it 2 years ago at which point the toenail turned black. She's been waiting for the nail the fall of ever since. Last night she caught her toenail on the right and felt the toenail been back after which he had bleeding and more pain.  Objective: Loose right great toenail with dry blood at the cuticle.  Assessment:  Traumatic nail avulsion, repaired  Plan:  Gentle wound care

## 2012-11-15 NOTE — Patient Instructions (Addendum)
Nail Avulsion Injury  Nail avulsion means that you have lost the whole, or part of a nail. The nail will usually grow back in 2 to 6 months. If your injury damaged the growth center of the nail, the nail may be deformed, split, or not stuck to the nail bed. Sometimes the avulsed nail is stitched back in place. This provides temporary protection to the nail bed until the new nail grows in.   HOME CARE INSTRUCTIONS    Raise (elevate) your injury as much as possible.   Protect the injury and cover it with bandages (dressings) or splints as instructed.   Change dressings as instructed.  SEEK MEDICAL CARE IF:    There is increasing pain, redness, or swelling.   You cannot move your fingers or toes.  Document Released: 09/28/2004 Document Revised: 11/13/2011 Document Reviewed: 07/23/2009  Exit[employer] Patient Information 2013 [employer].

## 2012-11-15 NOTE — Progress Notes (Signed)
Procedure Note: Verbal consent obtained from the patient.  Digital block of the right great toe with 4cc 2% plain lidocaine and Marcaine in a 1:1 ratio.  Betadine prep.  Great toe nail mostly avulsed due to injury.  The remaining nail was separated from the nail bed and removed in total without difficulty.  Nail margin explored, no remaining nail remnants.  Xeroform gauze applied to nail bed.  Dressing applied.  Wound care discussed.  Pt tolerated the procedure very well.    Procedure performed by Fernand Parkins PA-S.  I supervised and participated in the procedure.

## 2013-01-28 ENCOUNTER — Telehealth: Payer: Self-pay | Admitting: Neurology

## 2013-02-12 ENCOUNTER — Encounter: Payer: Medicare Other | Admitting: Neurology

## 2013-03-11 ENCOUNTER — Ambulatory Visit (INDEPENDENT_AMBULATORY_CARE_PROVIDER_SITE_OTHER): Payer: Medicare Other | Admitting: Neurology

## 2013-03-11 ENCOUNTER — Encounter: Payer: Self-pay | Admitting: Neurology

## 2013-03-11 VITALS — BP 141/76 | HR 67 | Temp 97.7°F | Ht 63.0 in | Wt 137.0 lb

## 2013-03-11 DIAGNOSIS — G14 Postpolio syndrome: Secondary | ICD-10-CM

## 2013-03-11 DIAGNOSIS — G4733 Obstructive sleep apnea (adult) (pediatric): Secondary | ICD-10-CM

## 2013-03-11 DIAGNOSIS — R413 Other amnesia: Secondary | ICD-10-CM

## 2013-03-11 DIAGNOSIS — K219 Gastro-esophageal reflux disease without esophagitis: Secondary | ICD-10-CM

## 2013-03-11 DIAGNOSIS — B91 Sequelae of poliomyelitis: Secondary | ICD-10-CM

## 2013-03-11 NOTE — Patient Instructions (Addendum)
I think overall you are doing fairly well and are stable at this point.   I do have some generic suggestions for you today:  Please make sure that you drink plenty of fluids. I would like for you to exercise daily for example in the form of walking 20-30 minutes every day, if you can. Please keep a regular sleep-wake schedule, keep regular meal times, do not skip any meals, eat  healthy snacks in between meals, such as fruit or nuts. Try to eat protein with every meal.   As far as your medications are concerned, I would like to suggest: no new medication.   As far as diagnostic testing, I recommend: no new test.  Engage in social activities in your community and with your family and try to keep up with current events by reading the newspaper or watching the news. Sign up with [email or web address]  I do not think we need to make any changes in your medications at this point. I think you're stable enough that I can see you back in 6 months, sooner if we need to. Please call us if you have any interim questions, concerns, or problems or updates to need to discuss.  Our phone number is [phone]. We also have an after hours call service for urgent matters and there is a physician on-call for urgent questions. For any emergencies you know to call 911 or go to the nearest emergency room.

## 2013-03-11 NOTE — Progress Notes (Signed)
Subjective:    Patient ID: Erica Davidson is a 72 y.o. female.  HPI  Interim history:   Erica Davidson is a very pleasant 72 year old right hand and woman who presents for followup consultation of her memory loss and sleep disorder and Post-polio. This is her first followup appointment after Dr. Imagene Gurney retirement. She is unaccompanied today. She was last seen by Dr. love on 10/04/2012, and which time he requested a repeat sleep study and blood work with B12 level and TSH. She has an underlying medical history of post polio syndrome with history of polio at age 15, hypothyroidism, hypertension, hyperlipidemia, fibromyalgia, tremor, neck pain and right knee pain. She is currently on Allegra, Nasonex, baclofen, calcium, Advil, Ultram, Astelin, Pepcid, Prilosec, sertraline, Diovan, levothyroxine. Her B12 level from 10/07/12 was 645 and her TSH was 2.79.    I reviewed Dr. Sandria Manly prior notes and the patient's records and below is a summary of that review:   72 year old right-handed woman with a history of post polio syndrome who has been complaining of easy fatigability and upper and lower chimney weakness. She was unable to tolerate CPAP with an underlying history of OSA. She has trouble falling asleep and also endorses this leg syndrome. She was diagnosed with fibromyalgia at polio clinic in [facility]. Dr. Sandria Manly started seeing her in November 2009 she has been complaining of postnasal drip and constant nasal drainage for which she has seen ENT. She has also seen an allergist. She has been tried on Astelin, Nasonex, Singulair, Allegra and Ambien as well as steroids. She has joint pain particularly right hip pain. She also has right knee pain. She has had some memory loss. She was diagnosed with reflux and was placed on Dexilant, she has been sleeping much better, including much less nasal drainage and much less post-nasal drip. She has some witnessed apneas per husband's report to her, but perhaps only  2-3 times per night. She also has been off of Baclofen, which was very sedating to her. She has been having trouble focusing. Her memory has been stable and she has done well with regards to her motor weakness. She has been having new muscle spasm in the L arm and describes intermittent L hand jerking. She has worse issues with jerking in her lower body. She has fallen in the past.   Her Past Medical History Is Significant For: Past Medical History  Diagnosis Date  . Arthritis   . Cataract   . Depression   . Blood transfusion without reported diagnosis   . GERD (gastroesophageal reflux disease)   . Ulcer   . Hypertension   . Neuromuscular disorder   . Polio     Her Past Surgical History Is Significant For: Past Surgical History  Procedure Laterality Date  . Appendectomy    . Cholecystectomy    . Eye surgery    . Abdominal hysterectomy      Her Family History Is Significant For: Family History  Problem Relation Age of Onset  . Rheum arthritis Mother   . Liver disease Father   . Cancer Sister     Lung    Her Social History Is Significant For: History   Social History  . Marital Status: Married    Spouse Name: Erica Davidson    Number of Children: 2  . Years of Education: College   Occupational History  . Retired    Social History Main Topics  . Smoking status: Never Smoker   . Smokeless  tobacco: Never Used  . Alcohol Use: No  . Drug Use: No  . Sexually Active: Yes   Other Topics Concern  . None   Social History Narrative   Pt lives at home with spouse.   Caffeine Use: quit 09/2012    Her Allergies Are:  Allergies  Allergen Reactions  . Ciprofloxacin     REACTION: rash/itch  . Loratadine-Pseudoephedrine Er     REACTION: tachycardia  . Prednisone     REACTION: high dose taper  :   Her Current Medications Are:  Outpatient Encounter Prescriptions as of 03/11/2013  Medication Sig Dispense Refill  . dexlansoprazole (DEXILANT) 60 MG capsule Take 60 mg by  mouth 2 (two) times daily.      . famotidine-calcium carbonate-magnesium hydroxide (PEPCID COMPLETE) 10-800-165 MG CHEW Chew 1 tablet by mouth daily as needed.      Marland Kitchen levothyroxine (SYNTHROID, LEVOTHROID) 150 MCG tablet Take 150 mcg by mouth daily.      . sertraline (ZOLOFT) 50 MG tablet Take 50 mg by mouth daily.      . valsartan-hydrochlorothiazide (DIOVAN-HCT) 160-12.5 MG per tablet Take 1 tablet by mouth daily.       No facility-administered encounter medications on file as of 03/11/2013.  : Review of Systems  Constitutional: Positive for fatigue.  HENT: Positive for hearing loss.        Itching  Respiratory: Positive for cough.        Snoring  Endocrine:       Flushing  Musculoskeletal: Positive for myalgias.       Cramps  Neurological: Positive for tremors and weakness.       Memory loss, confusion  Hematological: Bruises/bleeds easily.  Psychiatric/Behavioral: Positive for sleep disturbance (snoring).       Anxiety, decreased energy    Objective:  Neurologic Exam  Physical Exam Physical Examination:   Filed Vitals:   03/11/13 1141  BP: 141/76  Pulse: 67  Temp: 97.7 F (36.5 C)    General Examination: The patient is a very pleasant 72 y.o. female in no acute distress. She appears well-developed and well-nourished and well groomed.   HEENT: Normocephalic, atraumatic, pupils are equal, round and reactive to light and accommodation. Extraocular tracking is good without limitation to gaze excursion or nystagmus noted. Normal smooth pursuit is noted. Hearing is grossly intact. Tympanic membranes are clear bilaterally. Face is symmetric with normal facial animation and normal facial sensation. Speech is clear with no dysarthria noted. There is no hypophonia. There is no lip, neck/head, jaw or voice tremor. Neck is supple with full range of passive and active motion. There are no carotid bruits on auscultation. Oropharynx exam reveals: mild mouth dryness, adequate dental hygiene  and mild airway crowding, due to narrow anatomy. Mallampati is class I. Tongue protrudes centrally and palate elevates symmetrically.   Chest: Clear to auscultation without wheezing, rhonchi or crackles noted.  Heart: S1+S2+0, regular and normal without murmurs, rubs or gallops noted.   Abdomen: Soft, non-tender and non-distended with normal bowel sounds appreciated on auscultation.  Extremities: There is no pitting edema in the distal lower extremities bilaterally. Pedal pulses are intact.  Skin: Warm and dry without trophic changes noted. There are mild varicose veins and prominent spider veins.  Musculoskeletal: exam reveals no obvious joint deformities, tenderness or joint swelling or erythema.   Neurologically:  Mental status: The patient is awake, alert and oriented in all 4 spheres. Her memory, attention, language and knowledge are fairly well preserved. She is  able to give a fairly precise history. There is no aphasia, agnosia, apraxia or anomia. Speech is clear with normal prosody and enunciation. Thought process is linear. Mood is congruent and affect is normal.  Cranial nerves are as described above under HEENT exam. In addition, shoulder shrug is normal with equal shoulder height noted. Motor exam: Normal bulk, strength and tone is noted. There is no drift, tremor or rebound, with the exception of a slight postural tremor in the left hand only. Romberg is negative. Reflexes are 2+ throughout. Toes are downgoing bilaterally. Fine motor skills are intact with normal finger taps, normal hand movements, normal rapid alternating patting, normal foot taps and normal foot agility.  Cerebellar testing shows no dysmetria or intention tremor on finger to nose testing. Heel to shin is unremarkable bilaterally. There is no truncal or gait ataxia.  Sensory exam is intact to light touch, pinprick, vibration, temperature sense and proprioception in the upper and lower extremities, with the exception  of slightly decreased pinprick sensation in her distal left lower extremity.  Gait, station and balance: She stands up with mild difficulty and her stance initially is slightly wide-based. She is able to perform Romberg testing with no swaying noted. She is able to perform tandem walk and stand on her heels and toes. She is able to walk but walks slightly insecurely with a slightly wider base. No problems turning noted. Posture is age-appropriate. Assessment and Plan:   Assessment and Plan:  In summary, AMOURA RANSIER is a very pleasant 72 y.o.-year old female with a history of postpolio syndrome, some memory complaints, a sleep disorder including a prior diagnosis of obstructive sleep apnea. Recently she has been sleeping much better since she has been placed on Dexilant by her GI doctor. She has no significant residual post nasal drip or nasal drainage. She has been coughing a lot at night which has significantly improved. She has per husband's report very minimal residual apneas. She may be a candidate for a oral appliance down the road but at this time I suggested that we watch her symptoms. She has been noticing intermittent muscle jerking which may be myoclonus. I did not detect any on exam today. I explained to her that we may be able to treat this with another muscle relaxer but most of these will have sedation as a side effect. We will hold off at this time. Alternatively we could try a benzodiazepine but I would not recommend this for fear of habit formation and sedation. She was asking whether she needs to use a cane at all times. I suggested that she use her cane towards the end of the day when she is more tired and has muscle fatigability at the time. She is currently only slightly insecure with standing and starting to walk. At this juncture I believe overall she has done well. She is advised to come back in 6 months from now for a routine checkup, sooner if the need arises. She did not need any  prescriptions today and I did not feel that we needed to order any tests at this time. I reviewed her recent B12 level and TSH with her as well. She is advised to call with any interim questions, concerns, or problems. She was in agreement.

## 2013-09-10 ENCOUNTER — Encounter: Payer: Self-pay | Admitting: Neurology

## 2013-09-10 ENCOUNTER — Ambulatory Visit (INDEPENDENT_AMBULATORY_CARE_PROVIDER_SITE_OTHER): Payer: Medicare Other | Admitting: Neurology

## 2013-09-10 ENCOUNTER — Ambulatory Visit (INDEPENDENT_AMBULATORY_CARE_PROVIDER_SITE_OTHER): Payer: Medicare Other

## 2013-09-10 VITALS — BP 147/71 | HR 71 | Temp 97.3°F | Ht 63.0 in

## 2013-09-10 DIAGNOSIS — R413 Other amnesia: Secondary | ICD-10-CM

## 2013-09-10 DIAGNOSIS — K219 Gastro-esophageal reflux disease without esophagitis: Secondary | ICD-10-CM

## 2013-09-10 DIAGNOSIS — G14 Postpolio syndrome: Secondary | ICD-10-CM

## 2013-09-10 DIAGNOSIS — G4730 Sleep apnea, unspecified: Secondary | ICD-10-CM

## 2013-09-10 DIAGNOSIS — G255 Other chorea: Secondary | ICD-10-CM

## 2013-09-10 DIAGNOSIS — G479 Sleep disorder, unspecified: Secondary | ICD-10-CM

## 2013-09-10 DIAGNOSIS — G4733 Obstructive sleep apnea (adult) (pediatric): Secondary | ICD-10-CM

## 2013-09-10 DIAGNOSIS — G4710 Hypersomnia, unspecified: Secondary | ICD-10-CM

## 2013-09-10 DIAGNOSIS — B91 Sequelae of poliomyelitis: Secondary | ICD-10-CM

## 2013-09-10 DIAGNOSIS — G4761 Periodic limb movement disorder: Secondary | ICD-10-CM

## 2013-09-10 NOTE — Patient Instructions (Signed)

## 2013-09-10 NOTE — Progress Notes (Signed)
Subjective:    Patient ID: Erica Davidson is a 73 y.o. female.  HPI  Interim history:   Erica Davidson is a very pleasant 73 year old right hand and woman who presents for followup consultation of her memory loss and sleep disorder. She is unaccompanied today. I first met her on 03/11/2013 for routine followup, at which time I did not suggest any medication changes. She had been sleeping better since her GI Dr. Ricard Dillon her to [place] I did suggest that she use her cane as needed due to muscle fatigability and lack of energy reported and evidence of slight insecurity with standing and walking. She has an underlying medical history of post polio syndrome with history of polio at age 56, hypothyroidism, hypertension, hyperlipidemia, fibromyalgia, tremor, neck pain and right knee pain and allergies. She previously followed with Dr. Morene Antu and was last seen by him on 10/04/2012, at which time he requested a repeat sleep study and blood work.  Today, she reports intermittent jerking in her LUE and sometimes in the LLE. She has had this for several months and feels, that this is worse from before. She is sleepy during the day and this can be uncontrollable. This is new as well. She has maintained her weight, but she has snoring and witnessed apneas, per husband. She may fall asleep while at the computer. She feels that her sleep apnea may have become worse. She has had no changes in her medications or medical history other than: she had all over pain last week. She wonders if it was a FMS flare-up. She had increase in her BP values and had an increase in her BP medication, per PCP. Her memory is slightly worse.  She has been complaining of easy fatigability and upper and lower extremity weakness. She was unable to tolerate CPAP with an underlying history of OSA. She has trouble falling asleep and also endorses RLS symptoms. She was diagnosed with fibromyalgia at the polio clinic in [place], [place]. Dr.  Erling Cruz started seeing her in November 2009. She has been complaining of postnasal drip and constant nasal drainage for which she has seen ENT. She has also seen an allergist. She has been tried on Astelin, Nasonex, Singulair, Allegra and Ambien as well as steroids. She has joint pain particularly right hip pain. She also has right knee pain. She has had some memory loss. She was diagnosed with reflux and was placed on Dexilant, after which she has been sleeping much better, including much less nasal drainage and much less post-nasal drip. She has some witnessed apneas per husband's report to her, but perhaps only 2-3 times per night. She also has been off of Baclofen, which was very sedating to her. She has been having trouble focusing. Her memory has been stable and she has done well with regards to her motor weakness. She has had muscle spasms in the L arm and describes intermittent L hand jerking. She has fallen in the past.   Her Past Medical History Is Significant For: Past Medical History  Diagnosis Date  . Arthritis   . Cataract   . Depression   . Blood transfusion without reported diagnosis   . GERD (gastroesophageal reflux disease)   . Ulcer   . Hypertension   . Neuromuscular disorder   . Polio     Her Past Surgical History Is Significant For: Past Surgical History  Procedure Laterality Date  . Appendectomy    . Cholecystectomy    . Eye surgery    .  Abdominal hysterectomy      Her Family History Is Significant For: Family History  Problem Relation Age of Onset  . Rheum arthritis Mother   . Liver disease Father   . Cancer Sister     Lung    Her Social History Is Significant For: History   Social History  . Marital Status: Married    Spouse Name: Erica Davidson    Number of Children: 2  . Years of Education: College   Occupational History  . Retired    Social History Main Topics  . Smoking status: Never Smoker   . Smokeless tobacco: Never Used  . Alcohol Use: No   . Drug Use: No  . Sexual Activity: Yes   Other Topics Concern  . None   Social History Narrative   Pt lives at home with spouse.   Caffeine Use: quit 09/2012    Her Allergies Are:  Allergies  Allergen Reactions  . Ciprofloxacin     REACTION: rash/itch  . Loratadine-Pseudoephedrine Er     REACTION: tachycardia  . Prednisone     REACTION: high dose taper  :   Her Current Medications Are:  Outpatient Encounter Prescriptions as of 09/10/2013  Medication Sig  . dexlansoprazole (DEXILANT) 60 MG capsule Take 60 mg by mouth 2 (two) times daily.  . famotidine-calcium carbonate-magnesium hydroxide (PEPCID COMPLETE) 10-800-165 MG CHEW Chew 1 tablet by mouth daily as needed.  Marland Kitchen levothyroxine (SYNTHROID, LEVOTHROID) 150 MCG tablet Take 150 mcg by mouth daily.  . sertraline (ZOLOFT) 50 MG tablet Take 50 mg by mouth daily.  . valsartan-hydrochlorothiazide (DIOVAN-HCT) 160-12.5 MG per tablet Take 2 tablets by mouth daily.   :  Review of Systems:  Out of a complete 14 point review of systems, all are reviewed and negative with the exception of these symptoms as listed below:  Review of Systems  Constitutional: Positive for fatigue.  HENT: Positive for hearing loss and tinnitus.   Eyes: Positive for itching.  Respiratory: Positive for cough and shortness of breath.   Cardiovascular: Positive for leg swelling.  Gastrointestinal: Positive for diarrhea.  Endocrine: Positive for cold intolerance.  Genitourinary: Negative.   Musculoskeletal: Positive for back pain, gait problem, myalgias, neck pain and neck stiffness.  Skin: Negative.   Allergic/Immunologic: Positive for environmental allergies.  Neurological: Positive for tremors and weakness.       Memory loss  Hematological: Bruises/bleeds easily.  Psychiatric/Behavioral: Positive for confusion, sleep disturbance (apnea, snoring, daytime sleepiness) and decreased concentration.    Objective:  Neurologic Exam  Physical Exam Physical  Examination:   Filed Vitals:   09/10/13 1237  BP: 147/71  Pulse: 71  Temp: 97.3 F (36.3 C)   General Examination: The patient is a very pleasant 73 y.o. female in no acute distress. She appears well-developed and well-nourished and well groomed.   HEENT: Normocephalic, atraumatic, pupils are equal, round and reactive to light and accommodation. Extraocular tracking is good without limitation to gaze excursion or nystagmus noted. Normal smooth pursuit is noted. Hearing is grossly intact. Tympanic membranes are clear bilaterally. Funduscopic exam reveals normal fundus and normal vessels. Face is symmetric with normal facial animation and normal facial sensation. Speech is clear with no dysarthria noted. There is no hypophonia. There is no lip, neck/head, jaw or voice tremor. Neck is supple with full range of passive and active motion. There are no carotid bruits on auscultation. Oropharynx exam reveals: mild mouth dryness, adequate dental hygiene and mild airway crowding, due to narrow anatomy.  Mallampati is class I. Tongue protrudes centrally and palate elevates symmetrically.   Chest: Clear to auscultation without wheezing, rhonchi or crackles noted.  Heart: S1+S2+0, regular and normal without murmurs, rubs or gallops noted.   Abdomen: Soft, non-tender and non-distended with normal bowel sounds appreciated on auscultation.  Extremities: There is non pitting puffiness in the distal lower extremities bilaterally. Pedal pulses are intact.  Skin: Warm and dry without trophic changes noted. There are mild varicose veins and prominent spider veins.  Musculoskeletal: exam reveals no obvious joint deformities, tenderness or joint swelling or erythema.   Neurologically:  Mental status: The patient is awake, alert and oriented in all 4 spheres. Her memory, attention, language and knowledge are fairly well preserved. She is able to give a fairly precise history. There is no aphasia, agnosia, apraxia  or anomia. Speech is clear with normal prosody and enunciation. Thought process is linear. Mood is congruent and affect is normal.  Cranial nerves are as described above under HEENT exam. In addition, shoulder shrug is normal with equal shoulder height noted. Motor exam: Normal bulk, strength and tone is noted. There is no drift, tremor or rebound, with the exception of a slight postural tremor in the left hand only. Romberg is negative. Reflexes are 2+ throughout, LUE 3+. Toes are downgoing bilaterally. Fine motor skills are intact with normal finger taps, normal hand movements, normal rapid alternating patting, normal foot taps and normal foot agility. No myoclonus or other involuntary movements are noted. Cerebellar testing shows no dysmetria or intention tremor on finger to nose testing. Heel to shin is unremarkable bilaterally. There is no truncal or gait ataxia.  Sensory exam is intact to light touch, pinprick, vibration, temperature sense in the upper and lower extremities, with the exception of slightly decreased pinprick sensation in her distal left lower extremity.  Gait, station and balance: She stands up with mild difficulty and her stance initially is slightly wide-based. She is able to perform Romberg testing with no swaying noted. She is able to perform tandem walk and stand on her heels and toes. She walks slightly insecurely with a slightly wider base. No problems turning noted. Posture is age-appropriate.  Assessment and Plan:   In summary, Erica Davidson is a very pleasant 72 year old female with a history of postpolio syndrome, with memory complaints, a sleep disorder including a prior diagnosis of obstructive sleep apnea. Recently she has been more sleepy and has had more jerking of her muscle on the L side and feels that her short-term memory is worse. Thankfully, her exam is fairly stable. She describes worsening symptoms of obstructive sleep apnea which may result in her daytime  somnolence, in fact, perhaps her worsening jerking is a result of a combination of postpolio syndrome with fluctuations in her oxygen level at night from underlying OSA. I think at this time it is best that we recheck her sleep study and make sure she does not need to be treated. She would be willing to try CPAP again even though she had tried multiple masks in the past and could not tolerate them. She has noted more frequent but intermittent muscle jerking which may be myoclonus. I did not detect any on exam today. We could consider a different muscle relaxer down the road but it is possible that things improve on their own or if she is treated for sleep apnea there may be some improvement there. Certainly her subjective memory related complaints may also improve if she is treated  for sleep apnea if it is significant at this time. At this juncture, we Erica see her back after the sleep studies completed and I ordered that he polysomnogram today. She was in agreement.

## 2013-09-19 ENCOUNTER — Telehealth: Payer: Self-pay | Admitting: Neurology

## 2013-09-19 DIAGNOSIS — G4733 Obstructive sleep apnea (adult) (pediatric): Secondary | ICD-10-CM

## 2013-09-19 NOTE — Telephone Encounter (Signed)
Please call and notify patient that the recent sleep study confirmed the diagnosis of OSA, which was moderate to severe in her case. She did very well with CPAP during the study with significant improvement of the respiratory events. Therefore, I would like start the patient on CPAP at home. I placed the order in the chart.   Arrange for CPAP set up at home through a DME company of patient's choice and fax/route report to PCP and referring MD (if other than PCP).   The patient will also need a follow up appointment with me in 6-8 weeks post set up that has to be scheduled; help the patient schedule this (in a follow-up slot).   Please re-enforce the importance of compliance with treatment and the need for us to monitor compliance data.   Once you have spoken to the patient and scheduled the return appointment, you may close this encounter, thanks,   Huston FoleySaima Kadeidra Coryell, MD, PhD [facility]

## 2013-09-22 ENCOUNTER — Encounter: Payer: Self-pay | Admitting: *Deleted

## 2013-09-22 NOTE — Telephone Encounter (Signed)
I called and left a message for the patient about her recent sleep study results. I informed the patient that the study did confirm the diagnosis of obstructive sleep apnea which in her case is moderate to severe. I informed the patient that she did well on CPAP during the night of the study and Dr. Frances FurbishAthar recommend CPAP therapy at home. I will send the order to [facility] who will contact her once they receive benefits through [facility] for the CPAP machine. I will fax a copy to Dr. Salena Saner. Smith's office and mail a copy to the patient along with a follow up instruction letter.

## 2013-10-01 ENCOUNTER — Telehealth: Payer: Self-pay | Admitting: *Deleted

## 2013-10-01 NOTE — Telephone Encounter (Signed)
Patient called today and states she has a couple of questions which [facility] could not answer. 1.  She is using the CPAP and it does take some time to fall asleep and then she is waking frequently during the night.  She does report that she slept very well here in the lab, but she also had an Ambien that helped a great deal.  She states she had some issues with frequent awakenings before CPAP.  She is wondering if the CPAP is benefiting her if she is awake.  A.  I advised her that to help her build the 90 day compliance, she should continue wearing it even if she is awake until she meets the goal, her insurance will not know she wasn't sleeping, but they will look at total hours per night, so it is good to use it.  Explained the machine isn't "helping" her when she's awake because she isn't having events during wake but that it is all part of adjusting the machine and that is important and of benefit. B.  We discussed a sleep aid, she has used Melatonin without any noticeable affect on her sleep.  She slept well with Ambien but doesn't want to use it and get hooked on it..  We discussed that she may be having some awakenings due to the adjustment to CPAP, she also may have some spontaneous awakenings not related to CPAP, -- we hope the awakenings will go away as time goes on.  If not, these can be addressed at her follow up visit with Dr. Frances FurbishAthar.  She has no discomfort on CPAP and feels like she is using a relatively low pressure.  She does report occasional leaking from the mask.  2.  [facility] usage and her leak via modem and let her know if there was any issue.  She called today and they said they didn't even have her in the system yet, which didn't instill confidence in her.  I [facility].  I explained to patient they may just not have her in the system because she hasn't had a download yet.  I will ask for a download even though there is only  about a week's worth of usage, but it will allow us to assess the leak to see if it is excessive.    After the download is reviewed, I will contact the patient to give her feedback.

## 2013-10-10 ENCOUNTER — Encounter: Payer: Self-pay | Admitting: Neurology

## 2013-10-16 NOTE — Progress Notes (Signed)
Quick Note:  I reviewed the patient's CPAP compliance data from 09/26/2013 to 09/30/2013, which is a total of 5 days, during which time the patient used CPAP every day. The average usage for all days was 6 hours and 40 minutes. The percent used days greater than 4 hours was 100 %, indicating excellent compliance in the first 5 days. The residual AHI was 3.2 per hour, indicating an appropriate treatment pressure of 7 cwp with EPR of 3. I will review this data with the patient at the next office visit, which is upcoming on 10/27/2013 at 10:30 AM, provide feedback and additional troubleshooting if need be.  Huston FoleySaima Altha Sweitzer, MD, PhD [facility]   ______

## 2013-10-17 ENCOUNTER — Telehealth: Payer: Self-pay | Admitting: *Deleted

## 2013-10-17 NOTE — Telephone Encounter (Signed)
Patient reports doing well with nasal pillows but she is trying to use the chin strap (has used 2 different ones) and it is causing major disruption to her sleep and she just can't get it to stay in place.  I suggested stop trying to use the chin strap.  We discussed that she may not need it.  Discussed tongue placement as an alternative and acceptable leak.  She was glad to hear this news and will keep me informed if she develops any other concerns.

## 2013-10-22 ENCOUNTER — Encounter: Payer: Self-pay | Admitting: Neurology

## 2013-10-27 ENCOUNTER — Encounter: Payer: Self-pay | Admitting: Neurology

## 2013-10-27 ENCOUNTER — Ambulatory Visit (INDEPENDENT_AMBULATORY_CARE_PROVIDER_SITE_OTHER): Payer: Medicare Other | Admitting: Neurology

## 2013-10-27 VITALS — BP 125/72 | HR 74 | Temp 96.8°F | Ht 63.0 in | Wt 136.0 lb

## 2013-10-27 DIAGNOSIS — K219 Gastro-esophageal reflux disease without esophagitis: Secondary | ICD-10-CM

## 2013-10-27 DIAGNOSIS — R413 Other amnesia: Secondary | ICD-10-CM

## 2013-10-27 DIAGNOSIS — G14 Postpolio syndrome: Secondary | ICD-10-CM

## 2013-10-27 DIAGNOSIS — B91 Sequelae of poliomyelitis: Secondary | ICD-10-CM

## 2013-10-27 DIAGNOSIS — G4761 Periodic limb movement disorder: Secondary | ICD-10-CM

## 2013-10-27 DIAGNOSIS — Z9989 Dependence on other enabling machines and devices: Principal | ICD-10-CM

## 2013-10-27 DIAGNOSIS — G4733 Obstructive sleep apnea (adult) (pediatric): Secondary | ICD-10-CM

## 2013-10-27 NOTE — Patient Instructions (Signed)

## 2013-10-27 NOTE — Progress Notes (Signed)
Subjective:    Patient ID: Erica Davidson is a 73 y.o. female.  HPI    Interim history:   Erica Davidson is a very pleasant 73 year old right handed woman with an underlying medical history of post polio syndrome with history of polio at age 86, hypothyroidism, hypertension, hyperlipidemia, fibromyalgia, tremor, neck pain and right knee pain and allergies, who presents for followup consultation of her memory loss, her OSA, her twitching and her RLS with evidence of PLMs. She is unaccompanied today. I last saw her on 09/10/2013, at which time she reported more daytime somnolence and jerking of her muscles on the left side and worsening of her short-term memory. She was previously diagnosed with sleep apnea but was not on treatment with CPAP at the time. I suggested a sleep study. She had a split-night sleep study on 09/10/2013, and I went over her sleep test results with her in detail today: Baseline sleep efficiency was reduced at 69.5% with a latency to sleep of 12.5 minutes and wake after sleep onset of 42 minutes with severe sleep fragmentation noted. She had an elevated arousal index. She had an increased percentage of stage I and stage II sleep, and absence of deep sleep and REM sleep are to CPAP. She had occasional PVCs. She had moderate periodic leg movements of sleep at 44 per hour with an associated arousal index of 3.4 per hour. She had mild snoring. She had a total AHI of 19.8 per hour. Baseline oxygen saturation was 92%, nadir was 81%. She was then started on CPAP and titrated from 5-7 cm. Her AHI was reduced to 0 events per hour at the final pressure with supine REM sleep achieved. She had an improvement in her arousal index. She had normal stage I sleep, an increased percentage of stage II sleep, absence of slow-wave sleep and an increased percentage of REM sleep at 30%. Average oxygen saturation was improved at 96%, nadir was 93%, much improved. Snoring was a eliminated. She had very mild periodic  leg movements of sleep posts CPAP with 8.5 per hour and an associated arousal index of 0 per hour. Based on the test results I started her on CPAP at 7 cm of water pressure. I reviewed the patient's CPAP compliance data from 09/26/2013 to 09/30/2013, which is a total of 5 days, during which time the patient used CPAP every day. The average usage for all days was 6 hours and 40 minutes. The percent used days greater than 4 hours was 100 %, indicating excellent compliance in the first 5 days. The residual AHI was 3.2 per hour, indicating an appropriate treatment pressure of 7 cwp with EPR of 3.  Today, I reviewed her compliance data from 09/26/2013 through 10/26/2013 which is a total of 31 days during which time she uses CPAP every night. Percent used days greater than 4 hours was 94%, indicating excellent compliance. Average usage for all days was 6 hours and 16 minutes. Residual AHI was 2.9 per hour at a pressure of 7 cm with EPR of 3. 95th percentile of leak was 6.4 L per minute indicating a very low leak. Today, she reports that she has adjusted to the CPAP better than she had anticipated. She is a mouth breather and was given a chinstrap, but could not tolerate it. She likes the nasal pillows and has noted improvement in her muscle jerks, and her energy level. She has to go to the bathroom about 2 times per night. Overall, she is pleased  with how she is doing with CPAP and is pleasantly surprised. She has noted some LE edema, but she also is no longer on HCTZ. Her thyroid is fine.   I first met her on 03/11/2013 for routine followup, at which time I did not suggest any medication changes. She had been sleeping better after her GI Dr. Ricard Dillon her to [facility]. I did suggest that she use her cane as needed due to muscle fatigability and lack of energy reported and evidence of slight insecurity with standing and walking.  She previously followed with Dr. Morene Antu and was last seen by him on 10/04/2012, at  which time he requested a repeat sleep study and blood work.  She was unable to tolerate CPAP with an underlying history of OSA in the past. She had trouble falling asleep and also endorsed RLS symptoms. She was diagnosed with fibromyalgia at the polio clinic in [place], [place]. Dr. Erling Cruz started seeing her in November 2009. She has been complaining of postnasal drip and constant nasal drainage for which she has seen ENT. She has also seen an allergist. She has been tried on Astelin, Nasonex, Singulair, Allegra and Ambien as well as steroids. She has joint pain particularly right hip pain. She also has right knee pain. She has had some memory loss. She was diagnosed with reflux and was placed on Dexilant, after which she was sleeping much better, including much less nasal drainage and much less post-nasal drip. She had to stop Baclofen, which was very sedating to her. She has been having trouble focusing. Her memory has been stable and she has done well with regards to her motor weakness. She has had muscle spasms in the L arm and describes intermittent L hand jerking. She has fallen in the past.   Her Past Medical History Is Significant For: Past Medical History  Diagnosis Date  . Arthritis   . Cataract   . Depression   . Blood transfusion without reported diagnosis   . GERD (gastroesophageal reflux disease)   . Ulcer   . Hypertension   . Neuromuscular disorder   . Polio   . Immunization, tetanus-diphtheria 10/2002  . Encounter for Zostavax administration 02/10/2008  . Pneumococcal vaccination given 01/2009    Her Past Surgical History Is Significant For: Past Surgical History  Procedure Laterality Date  . Appendectomy    . Cholecystectomy    . Eye surgery    . Abdominal hysterectomy      Her Family History Is Significant For: Family History  Problem Relation Age of Onset  . Rheum arthritis Mother   . Liver disease Father   . Cancer Sister     Lung    Her Social History  Is Significant For: History   Social History  . Marital Status: Married    Spouse Name: Erica Davidson    Number of Children: 2  . Years of Education: College   Occupational History  . Retired    Social History Main Topics  . Smoking status: Never Smoker   . Smokeless tobacco: Never Used  . Alcohol Use: No  . Drug Use: No  . Sexual Activity: Yes   Other Topics Concern  . None   Social History Narrative   Pt lives at home with spouse.   Caffeine Use: quit 09/2012    Her Allergies Are:  Allergies  Allergen Reactions  . Ciprofloxacin     REACTION: rash/itch  . Loratadine-Pseudoephedrine Er     REACTION: tachycardia  .  Prednisone     REACTION: high dose taper  :   Her Current Medications Are:  Outpatient Encounter Prescriptions as of 10/27/2013  Medication Sig  . dexlansoprazole (DEXILANT) 60 MG capsule Take 60 mg by mouth 2 (two) times daily.  . famotidine-calcium carbonate-magnesium hydroxide (PEPCID COMPLETE) 10-800-165 MG CHEW Chew 1 tablet by mouth daily as needed.  Marland Kitchen levothyroxine (SYNTHROID, LEVOTHROID) 150 MCG tablet Take 150 mcg by mouth daily.  . sertraline (ZOLOFT) 50 MG tablet Take 50 mg by mouth daily.  . valsartan (DIOVAN) 320 MG tablet Take 1 tablet by mouth daily.  Marland Kitchen zolpidem (AMBIEN) 10 MG tablet   . metoCLOPramide (REGLAN) 5 MG tablet   . MOVIPREP 100 G SOLR   . promethazine (PHENERGAN) 12.5 MG tablet   . [DISCONTINUED] valsartan-hydrochlorothiazide (DIOVAN-HCT) 160-12.5 MG per tablet Take 2 tablets by mouth daily.   :  Review of Systems:  Out of a complete 14 point review of systems, all are reviewed and negative with the exception of these symptoms as listed below:   Review of Systems  Constitutional: Positive for fatigue.  HENT: Positive for hearing loss.   Eyes: Positive for itching.  Respiratory: Negative.   Cardiovascular: Negative.   Gastrointestinal: Positive for diarrhea and anal bleeding.       Incontinence  Endocrine: Positive for  cold intolerance.  Genitourinary: Positive for enuresis.  Musculoskeletal: Positive for gait problem, myalgias, neck pain and neck stiffness.       Muscle cramps  Skin: Negative.   Allergic/Immunologic: Negative.   Neurological: Positive for tremors and weakness.       Memory loss  Hematological: Bruises/bleeds easily.       Anemia  Psychiatric/Behavioral: Positive for confusion, sleep disturbance (Apnea, eds), dysphoric mood and decreased concentration.    Objective:  Neurologic Exam  Physical Exam Physical Examination:   Filed Vitals:   10/27/13 1052  BP: 125/72  Pulse: 74  Temp: 96.8 F (36 C)   General Examination: The patient is a very pleasant 73 y.o. female in no acute distress. She appears well-developed and well-nourished and well groomed.   HEENT: Normocephalic, atraumatic, pupils are unequal, R>L, round and reactive to light and accommodation. Extraocular tracking is good without limitation to gaze excursion or nystagmus noted. Normal smooth pursuit is noted. Hearing is grossly intact. Tympanic membranes are clear bilaterally. Funduscopic exam reveals normal fundus and normal vessels. Face is symmetric with normal facial animation and normal facial sensation. Speech is clear with no dysarthria noted. There is no hypophonia. There is no lip, neck/head, jaw or voice tremor. Neck is supple with full range of passive and active motion. There are no carotid bruits on auscultation. Oropharynx exam reveals: mild mouth dryness, adequate dental hygiene and mild airway crowding, due to narrow anatomy. Mallampati is class I. Tongue protrudes centrally and palate elevates symmetrically.   Chest: Clear to auscultation without wheezing, rhonchi or crackles noted.  Heart: S1+S2+0, regular and normal without murmurs, rubs or gallops noted.   Abdomen: Soft, non-tender and non-distended with normal bowel sounds appreciated on auscultation.  Extremities: There is trace puffiness in the  distal lower extremities bilaterally. Pedal pulses are intact.  Skin: Warm and dry without trophic changes noted. There are mild varicose veins and prominent spider veins.  Musculoskeletal: exam reveals no obvious joint deformities, tenderness or joint swelling or erythema.   Neurologically:  Mental status: The patient is awake, alert and oriented in all 4 spheres. Her memory, attention, language and knowledge are fairly well  preserved. She is able to give a fairly precise history. There is no aphasia, agnosia, apraxia or anomia. Speech is clear with normal prosody and enunciation. Thought process is linear. Mood is congruent and affect is normal.  Cranial nerves are as described above under HEENT exam. In addition, shoulder shrug is normal with equal shoulder height noted. Motor exam: Normal bulk, strength and tone is noted. There is no drift, tremor or rebound, with the exception of a slight postural tremor in the left hand only. Romberg is negative. Reflexes are 2+ throughout, LUE 3+ in the R knee and 2+ in the L. Fine motor skills are intact with normal finger taps, normal hand movements, normal rapid alternating patting, normal foot taps and normal foot agility. No myoclonus or other involuntary movements are noted. Cerebellar testing shows no dysmetria or intention tremor on finger to nose testing. Heel to shin is unremarkable bilaterally. There is no truncal or gait ataxia.  Sensory exam is intact to light touch, pinprick, vibration, temperature sense in the upper and lower extremities, with the exception of slightly decreased pinprick sensation in her distal left lower extremity.  Gait, station and balance: She stands up with mild difficulty and her stance initially is slightly wide-based. She is able to perform Romberg testing with no swaying noted. She is able to perform tandem walk and stand on her heels and toes. She walks slightly insecurely with a slightly wider base. No problems turning  noted. Posture is age-appropriate.  Assessment and Plan:   In summary, Erica Davidson is a very pleasant 73 year old female with a history of post-polio syndrome, with memory complaints, and myoclonus. Her exam is fairly stable. She has recently been confirmed to have moderate OSA, now established on CPAP treatment at a pressure of 7 cwp. She indicates good results with the use of CPAP, including better daytime energy level and improvement in her muscle jerks. She has had good tolerance of the pressure and mask. She had occasional mouth opening but could not tolerate the chin strap. She does not want to try a full face mask because of her claustrophobia. I reviewed her sleep study results with her as well as her in detail today and congratulated her on her great compliance. I encouraged her to continue to use CPAP regularly to help reduce cardiovascular risk.   We also talked about trying to maintaining a healthy lifestyle in general. I encouraged the patient to eat healthy, exercise daily and keep well hydrated, to keep a scheduled bedtime and wake time routine, to not skip any meals and eat healthy snacks in between meals and to have protein with every meal. I stressed the importance of regular exercise.   I answered all Her  questions today and the patient was in agreement with the above outlined plan. I would like to see the patient back in 6 months, sooner if the need arises and encouraged  her  to call with any interim questions, concerns, problems or updates.

## 2013-10-28 ENCOUNTER — Encounter: Payer: Self-pay | Admitting: Neurology

## 2013-11-12 NOTE — Telephone Encounter (Signed)
Closing encounter

## 2014-02-26 ENCOUNTER — Other Ambulatory Visit: Payer: Self-pay | Admitting: Family Medicine

## 2014-02-26 ENCOUNTER — Ambulatory Visit
Admission: RE | Admit: 2014-02-26 | Discharge: 2014-02-26 | Disposition: A | Discharge status: Home / Self Care | Payer: 59 | Source: Ambulatory Visit | Attending: Family Medicine | Admitting: Family Medicine

## 2014-02-26 DIAGNOSIS — R0602 Shortness of breath: Secondary | ICD-10-CM

## 2014-03-02 ENCOUNTER — Ambulatory Visit (HOSPITAL_COMMUNITY): Payer: Medicare Other | Attending: Cardiovascular Disease | Admitting: Cardiology

## 2014-03-02 ENCOUNTER — Other Ambulatory Visit (HOSPITAL_COMMUNITY): Payer: Self-pay | Admitting: Family Medicine

## 2014-03-02 DIAGNOSIS — R0602 Shortness of breath: Secondary | ICD-10-CM | POA: Insufficient documentation

## 2014-03-02 DIAGNOSIS — R609 Edema, unspecified: Secondary | ICD-10-CM

## 2014-03-02 NOTE — Progress Notes (Signed)
Echo performed. 

## 2014-04-24 ENCOUNTER — Encounter: Payer: Self-pay | Admitting: Neurology

## 2014-04-27 ENCOUNTER — Ambulatory Visit (INDEPENDENT_AMBULATORY_CARE_PROVIDER_SITE_OTHER): Payer: Medicare Other | Admitting: Neurology

## 2014-04-27 ENCOUNTER — Encounter: Payer: Self-pay | Admitting: Neurology

## 2014-04-27 VITALS — BP 142/80 | HR 63 | Temp 97.6°F | Ht 64.0 in | Wt 137.0 lb

## 2014-04-27 DIAGNOSIS — G14 Postpolio syndrome: Secondary | ICD-10-CM

## 2014-04-27 DIAGNOSIS — Z9989 Dependence on other enabling machines and devices: Principal | ICD-10-CM

## 2014-04-27 DIAGNOSIS — G4761 Periodic limb movement disorder: Secondary | ICD-10-CM

## 2014-04-27 DIAGNOSIS — G255 Other chorea: Secondary | ICD-10-CM

## 2014-04-27 DIAGNOSIS — R413 Other amnesia: Secondary | ICD-10-CM

## 2014-04-27 DIAGNOSIS — G4733 Obstructive sleep apnea (adult) (pediatric): Secondary | ICD-10-CM

## 2014-04-27 DIAGNOSIS — B91 Sequelae of poliomyelitis: Secondary | ICD-10-CM

## 2014-04-27 NOTE — Progress Notes (Signed)
Subjective:    Patient ID: Erica Davidson is a 72 y.o. female.  HPI    Interim history:   Erica Davidson is a very pleasant 73 year old right handed woman with an underlying medical history of post polio syndrome with history of polio at age 66, hypothyroidism, hypertension, hyperlipidemia, fibromyalgia, tremor, neck pain and right knee pain and allergies, who presents for followup consultation of her memory loss, OSA, twitching, RLS and PLMs. She is unaccompanied today. I last saw her on 10/27/2013, at which time she reported adjusting to CPAP better than she had anticipated. She had noted an improvement in her muscle jerks, and her energy level and was overall quite pleased with how she was doing with CPAP and pleasantly surprised. After stopping hydrochlorothiazide she had noticed an increase in her ankle edema. She had recently had thyroid function tested.  Today, I reviewed her compliance data from 03/24/2014 through 04/22/2014 which is a total of 30 days during which time she used her machine 26 days. Percent used days greater than 4 hours was 87%, indicating very good compliance. Average usage was 5 hours and 40 minutes. Residual AHI at 1.9 per hour and leak was low at 5.5 L per minute for the 95th percentile.  Today, she reports some discomfort with the headgear, but otherwise is feeling well with CPAP. She has no new complaints with regards to new neurological symptoms but she has had some blood in her stool yesterday and she had some abdominal pain. She has had some neck pain and had some neck stiffness. This is not new. She had had upper and lower endoscopies in Feb 2015. She has the occasional jerking in the L arm. She has mild short term memory issues. She has some LE edema. She had some congestion and had to skip the CPAP on those nights.    I saw her on 09/10/2013, at which time she reported more daytime somnolence and jerking of her muscles on the left side and worsening of her short-term  memory. She was previously diagnosed with sleep apnea but was not on treatment with CPAP at the time. I suggested a sleep study. She had a split-night sleep study on 09/10/2013, and I went over her sleep test results with her in detail today: Baseline sleep efficiency was reduced at 69.5% with a latency to sleep of 12.5 minutes and wake after sleep onset of 42 minutes with severe sleep fragmentation noted. She had an elevated arousal index. She had an increased percentage of stage I and stage II sleep, and absence of deep sleep and REM sleep are to CPAP. She had occasional PVCs. She had moderate periodic leg movements of sleep at 44 per hour with an associated arousal index of 3.4 per hour. She had mild snoring. She had a total AHI of 19.8 per hour. Baseline oxygen saturation was 92%, nadir was 81%. She was then started on CPAP and titrated from 5-7 cm. Her AHI was reduced to 0 events per hour at the final pressure with supine REM sleep achieved. She had an improvement in her arousal index. She had normal stage I sleep, an increased percentage of stage II sleep, absence of slow-wave sleep and an increased percentage of REM sleep at 30%. Average oxygen saturation was improved at 96%, nadir was 93%, much improved. Snoring was a eliminated. She had very mild periodic leg movements of sleep posts CPAP with 8.5 per hour and an associated arousal index of 0 per hour. Based on the test  results I started her on CPAP at 7 cm of water pressure. I reviewed the patient's CPAP compliance data from 09/26/2013 to 09/30/2013, which is a total of 5 days, during which time the patient used CPAP every day. The average usage for all days was 6 hours and 40 minutes. The percent used days greater than 4 hours was 100 %, indicating excellent compliance in the first 5 days. The residual AHI was 3.2 per hour, indicating an appropriate treatment pressure of 7 cwp with EPR of 3.  I reviewed her compliance data from 09/26/2013 through  10/26/2013 which is a total of 31 days during which time she used CPAP every night. Percent used days greater than 4 hours was 94%, indicating excellent compliance. Average usage for all days was 6 hours and 16 minutes. Residual AHI was 2.9 per hour at a pressure of 7 cm with EPR of 3. 95th percentile of leak was 6.4 L per minute indicating a very low leak.  I first met her on 03/11/2013 for routine followup, at which time I did not suggest any medication changes. She had been sleeping better after her GI Dr. Ricard Dillon her to [facility]. I did suggest that she use her cane as needed due to muscle fatigability and lack of energy reported and evidence of slight insecurity with standing and walking.  She previously followed with Dr. Morene Antu and was last seen by him on 10/04/2012, at which time he requested a repeat sleep study and blood work.  She was unable to tolerate CPAP with an underlying history of OSA in the past. She had trouble falling asleep and also endorsed RLS symptoms. She was diagnosed with fibromyalgia at the polio clinic in [place], [place]. Dr. Erling Cruz started seeing her in November 2009. She has been complaining of postnasal drip and constant nasal drainage for which she has seen ENT. She has also seen an allergist. She has been tried on Astelin, Nasonex, Singulair, Allegra and Ambien as well as steroids. She has joint pain particularly right hip pain. She also has right knee pain. She has had some memory loss. She was diagnosed with reflux and was placed on Dexilant, after which she was sleeping much better, including much less nasal drainage and much less post-nasal drip. She had to stop Baclofen, which was very sedating to her. She has been having trouble focusing. Her memory has been stable and she has done well with regards to her motor weakness. She has had muscle spasms in the L arm and describes intermittent L hand jerking. She has fallen in the past.   Her Past Medical History Is  Significant For: Past Medical History  Diagnosis Date  . Arthritis   . Cataract   . Depression   . Blood transfusion without reported diagnosis   . GERD (gastroesophageal reflux disease)   . Ulcer   . Hypertension   . Neuromuscular disorder   . Polio   . Immunization, tetanus-diphtheria 10/2002  . Encounter for Zostavax administration 02/10/2008  . Pneumococcal vaccination given 01/2009    Her Past Surgical History Is Significant For: Past Surgical History  Procedure Laterality Date  . Appendectomy    . Cholecystectomy    . Eye surgery    . Abdominal hysterectomy      Her Family History Is Significant For: Family History  Problem Relation Age of Onset  . Rheum arthritis Mother   . Liver disease Father   . Cancer Sister     Lung  Her Social History Is Significant For: History   Social History  . Marital Status: Married    Spouse Name: Will Bonnet    Number of Children: 2  . Years of Education: College   Occupational History  . Retired    Social History Main Topics  . Smoking status: Never Smoker   . Smokeless tobacco: Never Used  . Alcohol Use: No  . Drug Use: No  . Sexual Activity: Yes   Other Topics Concern  . None   Social History Narrative   Pt lives at home with spouse.   Caffeine Use: quit 09/2012   Patient is right handed    Her Allergies Are:  Allergies  Allergen Reactions  . Ciprofloxacin     REACTION: rash/itch  . Loratadine-Pseudoephedrine Er     REACTION: tachycardia  . Prednisone     REACTION: high dose taper  :   Her Current Medications Are:  Outpatient Encounter Prescriptions as of 04/27/2014  Medication Sig  . dexlansoprazole (DEXILANT) 60 MG capsule Take 60 mg by mouth 2 (two) times daily.  . famotidine-calcium carbonate-magnesium hydroxide (PEPCID COMPLETE) 10-800-165 MG CHEW Chew 1 tablet by mouth daily as needed.  Marland Kitchen levothyroxine (SYNTHROID, LEVOTHROID) 150 MCG tablet Take 150 mcg by mouth daily.  . sertraline (ZOLOFT)  50 MG tablet Take 50 mg by mouth daily.  . valsartan (DIOVAN) 320 MG tablet Take 1 tablet by mouth daily.  Marland Kitchen zolpidem (AMBIEN) 10 MG tablet   . [DISCONTINUED] metoCLOPramide (REGLAN) 5 MG tablet   . [DISCONTINUED] MOVIPREP 100 G SOLR   . [DISCONTINUED] promethazine (PHENERGAN) 12.5 MG tablet   :  Review of Systems:  Out of a complete 14 point review of systems, all are reviewed and negative with the exception of these symptoms as listed below:   Review of Systems  Constitutional: Positive for fatigue.  HENT: Positive for hearing loss.   Eyes: Positive for itching.  Respiratory: Positive for cough, shortness of breath and wheezing.   Cardiovascular: Positive for leg swelling.  Gastrointestinal: Positive for abdominal pain, diarrhea and anal bleeding.       Incontinence of bowels at times  Endocrine: Positive for cold intolerance.  Genitourinary: Positive for urgency.  Musculoskeletal: Positive for back pain, neck pain and neck stiffness.        walking difficulty,aching muscles  Neurological: Positive for tremors and weakness.       Apnea,  Memory loss  Psychiatric/Behavioral: Positive for decreased concentration.       Confusion    Objective:  Neurologic Exam  Physical Exam Physical Examination:   Filed Vitals:   04/27/14 1148  BP: 142/80  Pulse: 63  Temp: 97.6 F (36.4 C)   General Examination: The patient is a very pleasant 73 y.o. female in no acute distress. She appears well-developed and well-nourished and well groomed.   HEENT: Normocephalic, atraumatic, pupils are unequal, R>L, round and reactive to light and accommodation. Extraocular tracking is good without limitation to gaze excursion or nystagmus noted. Normal smooth pursuit is noted. Hearing is grossly intact. Funduscopic exam reveals normal fundus and normal vessels. Face is symmetric with normal facial animation and normal facial sensation. Speech is clear with no dysarthria noted. There is no hypophonia.  There is no lip, neck/head, jaw or voice tremor. Neck is supple with full range of passive and active motion. There are no carotid bruits on auscultation. Oropharynx exam reveals: mild mouth dryness, adequate dental hygiene and mild airway crowding, due to narrow anatomy.  Mallampati is class I. Tongue protrudes centrally and palate elevates symmetrically.   Chest: Clear to auscultation without wheezing, rhonchi or crackles noted.  Heart: S1+S2+0, regular and normal without murmurs, rubs or gallops noted.   Abdomen: Soft, non-tender and non-distended with normal bowel sounds appreciated on auscultation.  Extremities: There is trace puffiness in the distal lower extremities bilaterally. Pedal pulses are intact.  Skin: Warm and dry without trophic changes noted. There are mild varicose veins and prominent spider veins.  Musculoskeletal: exam reveals no obvious joint deformities, tenderness or joint swelling or erythema.   Neurologically:  Mental status: The patient is awake, alert and oriented in all 4 spheres. Her memory, attention, language and knowledge are fairly well preserved. She is able to give a fairly precise history. There is no aphasia, agnosia, apraxia or anomia. Speech is clear with normal prosody and enunciation. Thought process is linear. Mood is congruent and affect is normal.  Cranial nerves are as described above under HEENT exam. In addition, shoulder shrug is normal with equal shoulder height noted. Motor exam: Normal bulk, strength and tone is noted. There is no drift, tremor or rebound, with the exception of a slight postural tremor in the left hand only. Romberg is negative. Reflexes are 2+ throughout. Fine motor skills are intact with normal finger taps, normal hand movements, normal rapid alternating patting, normal foot taps and normal foot agility. No myoclonus or other involuntary movements are noted. Cerebellar testing shows no dysmetria or intention tremor on finger to  nose testing. Heel to shin is unremarkable bilaterally. There is no truncal or gait ataxia.  Sensory exam is intact to light touch, pinprick, vibration, temperature sense in the upper and lower extremities, with the exception of slightly decreased pinprick sensation in her distal left lower extremity, unchanged. Gait, station and balance: She stands up with mild difficulty and her stance initially is slightly wide-based. She is able to perform Romberg testing with no swaying noted. She walks slightly insecurely with a slightly wider base. No problems turning noted. Posture is age-appropriate.  Assessment and Plan:   In summary, Erica Davidson is a very pleasant 73 year old female with a history of post-polio syndrome, mild memory complaints, and myoclonus. Her exam is fairly stable. She has recently been confirmed to have moderate OSA, established on CPAP treatment at a pressure of 7 cwp. She continues to indicates good results with the use of CPAP, including better daytime energy level and improvement in her muscle jerks overall. She has had good tolerance of the pressure and mask, but had to skip some nights d/t congestion and post-nasal drip. She does have some discomfort with head year at times. She has had a longer standing history of neck pain. She has a history of degenerative lower back disease and explained to her that there is a good chance she has some degenerative neck disease as well. She has recently developed blood in her stool in the context of having some stomach cramps and abdominal pain. She is currently not in pain. She has an appointment with her primary care physician this afternoon. She is encouraged to try a heat pad for her neck pain and something topical maybe icy hot or Aspercreme. She should stay away from nonsteroidals at this time. I reviewed her compliance data with her today and congratulated her on her good compliance. She is encouraged to continue with CPAP regularly and I  would like to see her back in 6 months. From my end of  things she is doing well. I encouraged  her  to call with any interim questions, concerns, problems or updates.

## 2014-07-10 ENCOUNTER — Encounter: Payer: Self-pay | Admitting: Neurology

## 2014-10-28 ENCOUNTER — Encounter: Payer: Self-pay | Admitting: Neurology

## 2014-10-28 ENCOUNTER — Ambulatory Visit (INDEPENDENT_AMBULATORY_CARE_PROVIDER_SITE_OTHER): Payer: Medicare Other | Admitting: Neurology

## 2014-10-28 VITALS — BP 147/78 | HR 70 | Temp 96.9°F | Ht 64.0 in | Wt 140.0 lb

## 2014-10-28 DIAGNOSIS — M797 Fibromyalgia: Secondary | ICD-10-CM

## 2014-10-28 DIAGNOSIS — G4733 Obstructive sleep apnea (adult) (pediatric): Secondary | ICD-10-CM

## 2014-10-28 DIAGNOSIS — R413 Other amnesia: Secondary | ICD-10-CM

## 2014-10-28 DIAGNOSIS — Z9989 Dependence on other enabling machines and devices: Principal | ICD-10-CM

## 2014-10-28 NOTE — Patient Instructions (Signed)
We will continue with the CPAP.  I will refer you to rheumatology for your fibromyalgia.

## 2014-10-28 NOTE — Progress Notes (Signed)
Subjective:    Patient ID: Erica Davidson is a 74 y.o. female.  HPI     Interim history:   Erica Davidson is a very pleasant 74 year-old right handed woman with an underlying medical history of post polio syndrome with history of polio at age 66, hypothyroidism, hypertension, hyperlipidemia, fibromyalgia, tremor, neck pain and right knee pain and allergies, who presents for followup consultation of her memory loss, OSA, twitching, RLS and PLMs. She is unaccompanied today. I last saw her on 04/27/2014, at which time she reported problems with her CPAP machine headgear. Otherwise she was feeling well and improved with ongoing use of CPAP. She had recently noted some blood in her stool and had some abdominal pain and had an appointment with her primary care physician the same day. She reported intermittent neck pain and some neck stiffness, occasional left arm jerking, mild short-term memory issues and some lower extremity edema. I suggested no new medications and encouraged her to continue using CPAP.  Today, I reviewed her compliance data from 09/25/2014 10/24/2014 which is a total of 30 days during which time she used her machine 26 days with percent used days greater than 4 hours of 77%, indicating adequate compliance with an average usage of 5 hours and 29 minutes, residual AHI acceptable at 2.3 per hour and leak acceptable at 7.3 L/m at the 95th percentile on a pressure of 7 cm with EPR of 3.  Today, she reports still struggling to keep the CPAP on at night. She has ongoing issues with neck discomfort and shoulder girdle pain. She has had no major issues with arm twitching. She does agree that this has improved since she has been using her CPAP. She has had issues with her blood pressure and her medication was changed from valsartan to verapamil. This has helped. She has noticed a little bit of lower extremity swelling, but this is not new either. She states she was diagnosed with fibromyalgia by Dr.  Erling Cruz. She has never actually seen a rheumatologist.  I saw her on 10/27/2013, at which time she reported adjusting to CPAP better than she had anticipated. She had noted an improvement in her muscle jerks, and her energy level and was overall quite pleased with how she was doing with CPAP and pleasantly surprised. After stopping hydrochlorothiazide she had noticed an increase in her ankle edema. She had recently had thyroid function tested.  I reviewed her compliance data from 03/24/2014 through 04/22/2014 which is a total of 30 days during which time she used her machine 26 days. Percent used days greater than 4 hours was 87%, indicating very good compliance. Average usage was 5 hours and 40 minutes. Residual AHI at 1.9 per hour and leak was low at 5.5 L per minute for the 95th percentile.  I saw her on 09/10/2013, at which time she reported more daytime somnolence and jerking of her muscles on the left side and worsening of her short-term memory. She was previously diagnosed with sleep apnea but was not on treatment with CPAP at the time. I suggested a sleep study. She had a split-night sleep study on 09/10/2013, and I went over her sleep test results with her in detail today: Baseline sleep efficiency was reduced at 69.5% with a latency to sleep of 12.5 minutes and wake after sleep onset of 42 minutes with severe sleep fragmentation noted. She had an elevated arousal index. She had an increased percentage of stage I and stage II sleep, and absence  of deep sleep and REM sleep are to CPAP. She had occasional PVCs. She had moderate periodic leg movements of sleep at 44 per hour with an associated arousal index of 3.4 per hour. She had mild snoring. She had a total AHI of 19.8 per hour. Baseline oxygen saturation was 92%, nadir was 81%. She was then started on CPAP and titrated from 5-7 cm. Her AHI was reduced to 0 events per hour at the final pressure with supine REM sleep achieved. She had an improvement in  her arousal index. She had normal stage I sleep, an increased percentage of stage II sleep, absence of slow-wave sleep and an increased percentage of REM sleep at 30%. Average oxygen saturation was improved at 96%, nadir was 93%, much improved. Snoring was a eliminated. She had very mild periodic leg movements of sleep posts CPAP with 8.5 per hour and an associated arousal index of 0 per hour. Based on the test results I started her on CPAP at 7 cm of water pressure. I reviewed the patient's CPAP compliance data from 09/26/2013 to 09/30/2013, which is a total of 5 days, during which time the patient used CPAP every day. The average usage for all days was 6 hours and 40 minutes. The percent used days greater than 4 hours was 100 %, indicating excellent compliance in the first 5 days. The residual AHI was 3.2 per hour, indicating an appropriate treatment pressure of 7 cwp with EPR of 3.   I reviewed her compliance data from 09/26/2013 through 10/26/2013 which is a total of 31 days during which time she used CPAP every night. Percent used days greater than 4 hours was 94%, indicating excellent compliance. Average usage for all days was 6 hours and 16 minutes. Residual AHI was 2.9 per hour at a pressure of 7 cm with EPR of 3. 95th percentile of leak was 6.4 L per minute indicating a very low leak.   I first met her on 03/11/2013 for routine followup, at which time I did not suggest any medication changes. She had been sleeping better after her GI Dr. changed her to Dexilant. I did suggest that she use her cane as needed due to muscle fatigability and lack of energy reported and evidence of slight insecurity with standing and walking.   She previously followed with Dr. James Love and was last seen by him on 10/04/2012, at which time he requested a repeat sleep study and blood work.   She was unable to tolerate CPAP with an underlying history of OSA in the past. She had trouble falling asleep and also endorsed RLS  symptoms. She was diagnosed with fibromyalgia at the polio clinic in [place], [place]. Dr. Love started seeing her in November 2009. She has been complaining of postnasal drip and constant nasal drainage for which she has seen ENT. She has also seen an allergist. She has been tried on Astelin, Nasonex, Singulair, Allegra and Ambien as well as steroids. She has joint pain particularly right hip pain. She also has right knee pain. She has had some memory loss. She was diagnosed with reflux and was placed on Dexilant, after which she was sleeping much better, including much less nasal drainage and much less post-nasal drip. She had to stop Baclofen, which was very sedating to her. She has been having trouble focusing. Her memory has been stable and she has done well with regards to her motor weakness. She has had muscle spasms in the L arm and describes   intermittent L hand jerking. She has fallen in the past.    Her Past Medical History Is Significant For: Past Medical History  Diagnosis Date  . Arthritis   . Cataract   . Depression   . Blood transfusion without reported diagnosis   . GERD (gastroesophageal reflux disease)   . Ulcer   . Hypertension   . Neuromuscular disorder   . Polio   . Immunization, tetanus-diphtheria 10/2002  . Encounter for Zostavax administration 02/10/2008  . Pneumococcal vaccination given 01/2009    Her Past Surgical History Is Significant For: Past Surgical History  Procedure Laterality Date  . Appendectomy    . Cholecystectomy    . Eye surgery    . Abdominal hysterectomy      Her Family History Is Significant For: Family History  Problem Relation Age of Onset  . Rheum arthritis Mother   . Liver disease Father   . Cancer Sister     Lung    Her Social History Is Significant For: History   Social History  . Marital Status: Married    Spouse Name: Will Bonnet  . Number of Children: 2  . Years of Education: College   Occupational History  .  Retired    Social History Main Topics  . Smoking status: Never Smoker   . Smokeless tobacco: Never Used  . Alcohol Use: No  . Drug Use: No  . Sexual Activity: Yes   Other Topics Concern  . None   Social History Narrative   Pt lives at home with spouse.   Caffeine Use: quit 09/2012   Patient is right handed    Her Allergies Are:  Allergies  Allergen Reactions  . Ciprofloxacin     REACTION: rash/itch  . Loratadine-Pseudoephedrine Er     REACTION: tachycardia  . Prednisone     REACTION: high dose taper  :   Her Current Medications Are:  Outpatient Encounter Prescriptions as of 10/28/2014  Medication Sig  . dexlansoprazole (DEXILANT) 60 MG capsule Take 60 mg by mouth daily.   . famotidine-calcium carbonate-magnesium hydroxide (PEPCID COMPLETE) 10-800-165 MG CHEW Chew 1 tablet by mouth daily as needed.  Marland Kitchen levothyroxine (SYNTHROID, LEVOTHROID) 150 MCG tablet Take 150 mcg by mouth daily.  . sertraline (ZOLOFT) 50 MG tablet Take 50 mg by mouth daily.  . verapamil (VERELAN) 100 MG 24 hr capsule   . zolpidem (AMBIEN) 10 MG tablet   . [DISCONTINUED] valsartan (DIOVAN) 320 MG tablet Take 1 tablet by mouth daily.  :  Review of Systems:  Out of a complete 14 point review of systems, all are reviewed and negative with the exception of these symptoms as listed below:   Review of Systems  Neurological:       Hypertension     Objective:  Neurologic Exam  Physical Exam Physical Examination:   Filed Vitals:   10/28/14 1124  BP: 147/78  Pulse: 70  Temp: 96.9 F (36.1 C)   General Examination: The patient is a very pleasant 74 y.o. female in no acute distress. She appears well-developed and well-nourished and well groomed.   HEENT: Normocephalic, atraumatic, pupils are unequal, R>L (unchanged), round and reactive to light and accommodation. Extraocular tracking is good without limitation to gaze excursion or nystagmus noted. Normal smooth pursuit is noted. Hearing is grossly  intact. Funduscopic exam reveals normal fundus and normal vessels. Face is symmetric with normal facial animation and normal facial sensation. Speech is clear with no dysarthria noted. There is no hypophonia.  There is no lip, neck/head, jaw or voice tremor. Neck is supple with full range of passive and active motion. She has good shoulder strength. There are no palpable knots or bumps in her neck. She has no significant tenderness upon palpation of her shoulder girdle muscles. There are no carotid bruits on auscultation. Oropharynx exam reveals: mild mouth dryness, adequate dental hygiene and mild airway crowding, due to narrow anatomy. Mallampati is class I. Tongue protrudes centrally and palate elevates symmetrically.   Chest: Clear to auscultation without wheezing, rhonchi or crackles noted.  Heart: S1+S2+0, regular and normal without murmurs, rubs or gallops noted.   Abdomen: Soft, non-tender and non-distended with normal bowel sounds appreciated on auscultation.  Extremities: There is trace puffiness in the distal lower extremities bilaterally. Pedal pulses are intact.  Skin: Warm and dry without trophic changes noted. There are mild varicose veins and prominent spider veins.  Musculoskeletal: exam reveals no obvious joint deformities, tenderness or joint swelling or erythema.   Neurologically:  Mental status: The patient is awake, alert and oriented in all 4 spheres. Her memory, attention, language and knowledge are fairly well preserved. She is able to give a fairly precise history. There is no aphasia, agnosia, apraxia or anomia. Speech is clear with normal prosody and enunciation. Thought process is linear. Mood is congruent and affect is normal.  Cranial nerves are as described above under HEENT exam. In addition, shoulder shrug is normal with equal shoulder height noted. Motor exam: Normal bulk, strength and tone is noted. There is no drift, tremor or rebound, with the exception of a  slight postural tremor in the left hand only. Romberg is negative. Reflexes are 2+ throughout. Fine motor skills are intact with normal finger taps, normal hand movements, normal rapid alternating patting, normal foot taps and normal foot agility. No myoclonus or other involuntary movements are noted. Cerebellar testing shows no dysmetria or intention tremor on finger to nose testing. Heel to shin is unremarkable bilaterally. There is no truncal or gait ataxia.  Sensory exam is intact to light touch, pinprick, vibration, temperature sense in the upper and lower extremities, with the exception of slightly decreased pinprick sensation in her distal left lower extremity, unchanged. Gait, station and balance: She stands up with mild difficulty and her stance initially is slightly wide-based. She walks slightly insecurely with a slightly wider base. No problems turning noted. Posture is age-appropriate.  Assessment and Plan:   In summary, Erica Davidson is a very pleasant 73-year old female with a history of post-polio syndrome, memory complaints, myoclonus and OSA, treated with CPAP at a pressure of 7 cwp with stable exam, good results with CPAP and good compliance. She continues to struggle with keeping the mask on all night but does endorse that her muscle jerking have improved and overall she is compliant with treatment and is commended for this. She has had a longer standing history of neck pain. She has a history of degenerative lower back disease and explained to her that there is a good chance she has some degenerative neck disease as well. She has never actually seen a rheumatologist and carries a diagnosis of fibromyalgia. I suggested a referral to rheumatology. Her exam is stable for me. She is reassured in that regard. I will see her back routinely in 6 months, sooner if need be. I answered all her questions today and she was in agreement.  

## 2014-11-09 ENCOUNTER — Encounter: Payer: Self-pay | Admitting: Neurology

## 2015-04-28 ENCOUNTER — Encounter: Payer: Self-pay | Admitting: Neurology

## 2015-04-28 ENCOUNTER — Ambulatory Visit (INDEPENDENT_AMBULATORY_CARE_PROVIDER_SITE_OTHER): Payer: Medicare Other | Admitting: Neurology

## 2015-04-28 VITALS — BP 158/76 | HR 76 | Resp 16 | Ht 64.0 in | Wt 140.0 lb

## 2015-04-28 DIAGNOSIS — R258 Other abnormal involuntary movements: Secondary | ICD-10-CM | POA: Diagnosis not present

## 2015-04-28 DIAGNOSIS — G14 Postpolio syndrome: Secondary | ICD-10-CM | POA: Diagnosis not present

## 2015-04-28 DIAGNOSIS — G8929 Other chronic pain: Secondary | ICD-10-CM

## 2015-04-28 DIAGNOSIS — Z9989 Dependence on other enabling machines and devices: Principal | ICD-10-CM

## 2015-04-28 DIAGNOSIS — M542 Cervicalgia: Secondary | ICD-10-CM

## 2015-04-28 DIAGNOSIS — G4733 Obstructive sleep apnea (adult) (pediatric): Secondary | ICD-10-CM | POA: Diagnosis not present

## 2015-04-28 DIAGNOSIS — R253 Fasciculation: Secondary | ICD-10-CM

## 2015-04-28 NOTE — Progress Notes (Signed)
Subjective:    Patient ID: Erica Davidson is a 74 y.o. female.  HPI    Interim history:   Erica Davidson is a very pleasant 74 year-old right handed woman with an underlying medical history of post polio syndrome with history of polio at age 37, hypothyroidism, hypertension, hyperlipidemia, fibromyalgia, tremor, neck pain and right knee pain and allergies, who presents for followup consultation of Erica Davidson memory loss, OSA, twitching, RLS and PLMs. Erica Davidson is unaccompanied today. I last saw Erica Davidson on 10/28/2014, at which time Erica Davidson reported still struggling to keep the CPAP on at night. Erica Davidson was having ongoing issues with neck pain and shoulder pain. Erica Davidson had no significant issues with muscle twitching. Erica Davidson felt that overall Erica Davidson had improved and Erica Davidson muscle twitches had improved after starting CPAP therapy. Erica Davidson had new medication for Erica Davidson blood pressure. Erica Davidson had noticed some lower extremity swelling. Erica Davidson reported a diagnosis of fibromyalgia years ago by Dr. Erling Cruz. Erica Davidson had never seen a rheumatologist for this. I suggested a referral to rheumatology.  Today, 04/28/2015: I reviewed Erica Davidson CPAP compliance data from 03/27/2015 through 04/25/2015 which is a total of 30 days during which time Erica Davidson used Erica Davidson machine 27 days, with percent used days greater than 4 hours at 83%, indicating very good compliance with an average usage of 6 hours and 37 minutes with days on treatment. Residual AHI low at 1.4 per hour, leaked low with the 95th percentile at 7.8 L/m on a pressure of 7 cm with EPR of 3.  Today, 04/28/2015: Erica Davidson reports that Erica Davidson has ongoing issues with Erica Davidson neck. Erica Davidson feels Erica Davidson neck muscles a week. Erica Davidson had seen a rheumatologist, Dr. Amil Amen who did some x-rays and while he did agree that Erica Davidson probably has fibromyalgia, he felt that Erica Davidson neck pain was out of proportion. Erica Davidson has since then been seeing Dr. Maryjean Ka for pain management. He talk to Erica Davidson about different options including injections. Erica Davidson wants to avoid injections. Erica Davidson is currently  doing some exercises that Erica Davidson found online. Local heat and a TENS unit provide temporary relief. Dr. Maryjean Ka mentioned physical therapy to Erica Davidson but outpatient physical therapy is difficult for Erica Davidson to achieve as Erica Davidson does not drive any distances. In the past Erica Davidson tried Neurontin but cannot tolerate it.  Previously:  I saw Erica Davidson on 04/27/2014, at which time Erica Davidson reported problems with Erica Davidson CPAP machine headgear. Otherwise Erica Davidson was feeling well and improved with ongoing use of CPAP. Erica Davidson had recently noted some blood in Erica Davidson stool and had some abdominal pain and had an appointment with Erica Davidson primary care physician the same day. Erica Davidson reported intermittent neck pain and some neck stiffness, occasional left arm jerking, mild short-term memory issues and some lower extremity edema. I suggested no new medications and encouraged Erica Davidson to continue using CPAP.  I reviewed Erica Davidson compliance data from 09/25/2014 10/24/2014 which is a total of 30 days during which time Erica Davidson used Erica Davidson machine 26 days with percent used days greater than 4 hours of 77%, indicating adequate compliance with an average usage of 5 hours and 29 minutes, residual AHI acceptable at 2.3 per hour and leak acceptable at 7.3 L/m at the 95th percentile on a pressure of 7 cm with EPR of 3.  I saw Erica Davidson on 10/27/2013, at which time Erica Davidson reported adjusting to CPAP better than Erica Davidson had anticipated. Erica Davidson had noted an improvement in Erica Davidson muscle jerks, and Erica Davidson energy level and was overall quite pleased with how Erica Davidson was doing with CPAP  and pleasantly surprised. After stopping hydrochlorothiazide Erica Davidson had noticed an increase in Erica Davidson ankle edema. Erica Davidson had recently had thyroid function tested.  I reviewed Erica Davidson compliance data from 03/24/2014 through 04/22/2014 which is a total of 30 days during which time Erica Davidson used Erica Davidson machine 26 days. Percent used days greater than 4 hours was 87%, indicating very good compliance. Average usage was 5 hours and 40 minutes. Residual AHI at 1.9 per hour and  leak was low at 5.5 L per minute for the 95th percentile.  I saw Erica Davidson on 09/10/2013, at which time Erica Davidson reported more daytime somnolence and jerking of Erica Davidson muscles on the left side and worsening of Erica Davidson short-term memory. Erica Davidson was previously diagnosed with sleep apnea but was not on treatment with CPAP at the time. I suggested a sleep study. Erica Davidson had a split-night sleep study on 09/10/2013, and I went over Erica Davidson sleep test results with Erica Davidson in detail today: Baseline sleep efficiency was reduced at 69.5% with a latency to sleep of 12.5 minutes and wake after sleep onset of 42 minutes with severe sleep fragmentation noted. Erica Davidson had an elevated arousal index. Erica Davidson had an increased percentage of stage I and stage II sleep, and absence of deep sleep and REM sleep are to CPAP. Erica Davidson had occasional PVCs. Erica Davidson had moderate periodic leg movements of sleep at 44 per hour with an associated arousal index of 3.4 per hour. Erica Davidson had mild snoring. Erica Davidson had a total AHI of 19.8 per hour. Baseline oxygen saturation was 92%, nadir was 81%. Erica Davidson was then started on CPAP and titrated from 5-7 cm. Erica Davidson AHI was reduced to 0 events per hour at the final pressure with supine REM sleep achieved. Erica Davidson had an improvement in Erica Davidson arousal index. Erica Davidson had normal stage I sleep, an increased percentage of stage II sleep, absence of slow-wave sleep and an increased percentage of REM sleep at 30%. Average oxygen saturation was improved at 96%, nadir was 93%, much improved. Snoring was a eliminated. Erica Davidson had very mild periodic leg movements of sleep posts CPAP with 8.5 per hour and an associated arousal index of 0 per hour. Based on the test results I started Erica Davidson on CPAP at 7 cm of water pressure. I reviewed the patient's CPAP compliance data from 09/26/2013 to 09/30/2013, which is a total of 5 days, during which time the patient used CPAP every day. The average usage for all days was 6 hours and 40 minutes. The percent used days greater than 4 hours was 100 %,  indicating excellent compliance in the first 5 days. The residual AHI was 3.2 per hour, indicating an appropriate treatment pressure of 7 cwp with EPR of 3.   I reviewed Erica Davidson compliance data from 09/26/2013 through 10/26/2013 which is a total of 31 days during which time Erica Davidson used CPAP every night. Percent used days greater than 4 hours was 94%, indicating excellent compliance. Average usage for all days was 6 hours and 16 minutes. Residual AHI was 2.9 per hour at a pressure of 7 cm with EPR of 3. 95th percentile of leak was 6.4 L per minute indicating a very low leak.   I first met Erica Davidson on 03/11/2013 for routine followup, at which time I did not suggest any medication changes. Erica Davidson had been sleeping better after Erica Davidson GI Dr. Ricard Dillon Erica Davidson to [facility]. I did suggest that Erica Davidson use Erica Davidson cane as needed due to muscle fatigability and lack of energy reported and evidence of slight insecurity with standing  and walking.   Erica Davidson previously followed with Dr. Morene Antu and was last seen by him on 10/04/2012, at which time he requested a repeat sleep study and blood work.   Erica Davidson was unable to tolerate CPAP with an underlying history of OSA in the past. Erica Davidson had trouble falling asleep and also endorsed RLS symptoms. Erica Davidson was diagnosed with fibromyalgia at the polio clinic in [place], [place]. Dr. Erling Cruz started seeing Erica Davidson in November 2009. Erica Davidson has been complaining of postnasal drip and constant nasal drainage for which Erica Davidson has seen ENT. Erica Davidson has also seen an allergist. Erica Davidson has been tried on Astelin, Nasonex, Singulair, Allegra and Ambien as well as steroids. Erica Davidson has joint pain particularly right hip pain. Erica Davidson also has right knee pain. Erica Davidson has had some memory loss. Erica Davidson was diagnosed with reflux and was placed on Dexilant, after which Erica Davidson was sleeping much better, including much less nasal drainage and much less post-nasal drip. Erica Davidson had to stop Baclofen, which was very sedating to Erica Davidson. Erica Davidson has been having trouble focusing. Erica Davidson memory  has been stable and Erica Davidson has done well with regards to Erica Davidson motor weakness. Erica Davidson has had muscle spasms in the L arm and describes intermittent L hand jerking. Erica Davidson has fallen in the past.  Erica Davidson Past Medical History Is Significant For: Past Medical History  Diagnosis Date  . Arthritis   . Cataract   . Depression   . Blood transfusion without reported diagnosis   . GERD (gastroesophageal reflux disease)   . Ulcer   . Hypertension   . Neuromuscular disorder   . Polio   . Immunization, tetanus-diphtheria 10/2002  . Encounter for Zostavax administration 02/10/2008  . Pneumococcal vaccination given 01/2009  . Fibromyalgia     Erica Davidson Past Surgical History Is Significant For: Past Surgical History  Procedure Laterality Date  . Appendectomy    . Cholecystectomy    . Eye surgery    . Abdominal hysterectomy      Erica Davidson Family History Is Significant For: Family History  Problem Relation Age of Onset  . Rheum arthritis Mother   . Liver disease Father   . Cancer Sister     Lung    Erica Davidson Social History Is Significant For: Social History   Social History  . Marital Status: Married    Spouse Name: Will Bonnet  . Number of Children: 2  . Years of Education: College   Occupational History  . Retired    Social History Main Topics  . Smoking status: Never Smoker   . Smokeless tobacco: Never Used  . Alcohol Use: No  . Drug Use: No  . Sexual Activity: Yes   Other Topics Concern  . None   Social History Narrative   Pt lives at home with spouse.   Caffeine Use: quit 09/2012   Patient is right handed    Erica Davidson Allergies Are:  Allergies  Allergen Reactions  . Ciprofloxacin     REACTION: rash/itch  . Loratadine-Pseudoephedrine Er     REACTION: tachycardia  . Prednisone     REACTION: high dose taper  :   Erica Davidson Current Medications Are:  Outpatient Encounter Prescriptions as of 04/28/2015  Medication Sig  . dexlansoprazole (DEXILANT) 60 MG capsule Take 60 mg by mouth daily.   .  hydrochlorothiazide (MICROZIDE) 12.5 MG capsule   . levothyroxine (SYNTHROID, LEVOTHROID) 137 MCG tablet   . sertraline (ZOLOFT) 50 MG tablet Take 50 mg by mouth daily.  . traMADol (ULTRAM) 50 MG  tablet   . verapamil (VERELAN) 100 MG 24 hr capsule   . zolpidem (AMBIEN) 10 MG tablet   . [DISCONTINUED] famotidine-calcium carbonate-magnesium hydroxide (PEPCID COMPLETE) 10-800-165 MG CHEW Chew 1 tablet by mouth daily as needed.  . [DISCONTINUED] levothyroxine (SYNTHROID, LEVOTHROID) 150 MCG tablet Take 150 mcg by mouth daily.   No facility-administered encounter medications on file as of 04/28/2015.  :  Review of Systems:  Out of a complete 14 point review of systems, all are reviewed and negative with the exception of these symptoms as listed below:   Review of Systems  Musculoskeletal:       Neck and shoulder pain   Patient states that Erica Davidson did see Rheumatology. They took x-rays and saw stenosis and arthritis. They referred patient to neurosurgeon who recommended PT and ESI. Patient was unable to do either.   Neurological:       New pain in L leg and swelling in L foot.     Objective:  Neurologic Exam  Physical Exam Physical Examination:   Filed Vitals:   04/28/15 1122  BP: 158/76  Pulse: 76  Resp: 16   General Examination: The patient is a very pleasant 74 y.o. female in no acute distress. Erica Davidson appears well-developed and well-nourished and well groomed.   HEENT: Normocephalic, atraumatic, pupils are unequal, R>L (unchanged), round and reactive to light and accommodation. Extraocular tracking is good without limitation to gaze excursion or nystagmus noted. Normal smooth pursuit is noted. Hearing is grossly intact. Funduscopic exam reveals normal fundus and normal vessels. Face is symmetric with normal facial animation and normal facial sensation. Speech is clear with no dysarthria noted. There is no hypophonia. There is no lip, neck/head, jaw or voice tremor. Neck is supple with full  range of passive and active motion. Erica Davidson has good shoulder strength. There are no carotid bruits on auscultation. Oropharynx exam reveals: mild mouth dryness, adequate dental hygiene and mild airway crowding, due to narrow anatomy. Mallampati is class I. Tongue protrudes centrally and palate elevates symmetrically.   Chest: Clear to auscultation without wheezing, rhonchi or crackles noted.  Heart: S1+S2+0, regular and normal without murmurs, rubs or gallops noted.   Abdomen: Soft, non-tender and non-distended with normal bowel sounds appreciated on auscultation.  Extremities: There is trace puffiness in the distal lower extremities bilaterally, left more than right. Pedal pulses are intact.  Skin: Warm and dry without trophic changes noted. There are mild varicose veins and prominent spider veins throughout Erica Davidson legs.  Musculoskeletal: exam reveals no obvious joint deformities, tenderness or joint swelling or erythema.   Neurologically:  Mental status: The patient is awake, alert and oriented in all 4 spheres. Erica Davidson memory, attention, language and knowledge are fairly well preserved. Erica Davidson is able to give a fairly precise history. There is no aphasia, agnosia, apraxia or anomia. Speech is clear with normal prosody and enunciation. Thought process is linear. Mood is congruent and affect is normal.  Cranial nerves are as described above under HEENT exam. In addition, shoulder shrug is normal with equal shoulder height noted. Motor exam: Normal bulk, strength and tone is noted. There is no drift, tremor or rebound, with the exception of a slight postural tremor in the left hand only, unchanged. Romberg is negative. Reflexes are 2+ throughout. Fine motor skills are intact with normal finger taps, normal hand movements, normal rapid alternating patting, normal foot taps and normal foot agility. No myoclonus or other involuntary movements are noted. Cerebellar testing shows no dysmetria or intention  tremor on  finger to nose testing. Heel to shin is unremarkable bilaterally. There is no truncal or gait ataxia.  Sensory exam is intact to light touch, pinprick, vibration, temperature sense in the upper and lower extremities, with the exception of slightly decreased pinprick sensation in Erica Davidson distal left lower extremity, unchanged. Gait, station and balance: Erica Davidson stands up with mild difficulty and Erica Davidson stance initially is slightly wide-based. Erica Davidson walks slightly insecurely with a slightly wider base. No problems turning noted. Posture is age-appropriate. Erica Davidson has initial difficulty with tandem walk.  Assessment and Plan:   In summary, AILSA MIRELES is a very pleasant 74 year old female with a history of post-polio syndrome, memory complaints, myoclonus and OSA, treated with CPAP at a pressure of 7 cwp with stable exam, good results with CPAP and good compliance. Erica Davidson continues to struggle with keeping the mask on all night but does endorse that Erica Davidson muscle jerking have improved and overall Erica Davidson is compliant with treatment and is commended for this. Erica Davidson has had a longer standing history of neck pain. Erica Davidson has a history of degenerative lower back disease and has also recently seen rheumatology and has been seeing Dr. Maryjean Ka. Erica Davidson is trying to do some strengthening exercises and would like to avoid injection treatments of possible. Local heat and a TENS unit provide temporary relief Erica Davidson feels. Erica Davidson is advised that Erica Davidson needs to continue exercising Erica Davidson neck muscles but may also find relief in deep breathing and meditation exercises that can help relax muscle tension. For Erica Davidson lower extremity swelling which is a little bit more pronounced on the left Erica Davidson is advised to talk to Erica Davidson primary care physician again. Erica Davidson did report that Erica Davidson had recent thyroid function testing and had an adjustment in Erica Davidson thyroid medication.  Otherwise, Erica Davidson exam is stable for me. I would like to see Erica Davidson back in 6 months, sooner if the need arises. Erica Davidson is  encouraged to call with any interim questions or concerns. I answered all Erica Davidson questions today and Erica Davidson was in agreement.  I spent 20 minutes in total face-to-face time with the patient, more than 50% of which was spent in counseling and coordination of care, reviewing test results, reviewing medication and discussing or reviewing the diagnosis of OSA, neck pain, its prognosis and treatment options.

## 2015-04-28 NOTE — Patient Instructions (Addendum)
Please continue using your CPAP regularly. While your insurance requires that you use CPAP at least 4 hours each night on 70% of the nights, I recommend, that you not skip any nights and use it throughout the night if you can. Getting used to CPAP and staying with the treatment long term does take time and patience and discipline. Untreated obstructive sleep apnea when it is moderate to severe can have an adverse impact on cardiovascular health and raise her risk for heart disease, arrhythmias, hypertension, congestive heart failure, stroke and diabetes. Untreated obstructive sleep apnea causes sleep disruption, nonrestorative sleep, and sleep deprivation. This can have an impact on your day to day functioning and cause daytime sleepiness and impairment of cognitive function, memory loss, mood disturbance, and problems focussing. Using CPAP regularly can improve these symptoms.  Keep up the good work! I will see you back in 6 months for sleep apnea check up.   Try deep breathing exercises and consider meditation for muscle relaxation.   Talk to Dr. Katrinka Blazing about the leg swelling on the left again.

## 2015-10-28 ENCOUNTER — Encounter: Payer: Self-pay | Admitting: Neurology

## 2015-10-28 ENCOUNTER — Ambulatory Visit (INDEPENDENT_AMBULATORY_CARE_PROVIDER_SITE_OTHER): Payer: Medicare Other | Admitting: Neurology

## 2015-10-28 VITALS — BP 150/78 | HR 70 | Resp 16 | Ht 64.0 in | Wt 144.0 lb

## 2015-10-28 DIAGNOSIS — R253 Fasciculation: Secondary | ICD-10-CM

## 2015-10-28 DIAGNOSIS — G14 Postpolio syndrome: Secondary | ICD-10-CM

## 2015-10-28 DIAGNOSIS — R258 Other abnormal involuntary movements: Secondary | ICD-10-CM | POA: Diagnosis not present

## 2015-10-28 DIAGNOSIS — G4733 Obstructive sleep apnea (adult) (pediatric): Secondary | ICD-10-CM | POA: Diagnosis not present

## 2015-10-28 DIAGNOSIS — M7989 Other specified soft tissue disorders: Secondary | ICD-10-CM | POA: Diagnosis not present

## 2015-10-28 DIAGNOSIS — M542 Cervicalgia: Secondary | ICD-10-CM

## 2015-10-28 DIAGNOSIS — G8929 Other chronic pain: Secondary | ICD-10-CM | POA: Diagnosis not present

## 2015-10-28 DIAGNOSIS — Z9989 Dependence on other enabling machines and devices: Principal | ICD-10-CM

## 2015-10-28 NOTE — Progress Notes (Signed)
Subjective:    Patient ID: Erica Davidson is a 75 y.o. female.  HPI     Interim history:   Erica Davidson is a very pleasant 75 year-old right handed woman with an underlying medical history of post polio syndrome with history of polio at age 3, hypothyroidism, hypertension, hyperlipidemia, fibromyalgia, tremor, neck pain and right knee pain and allergies, who presents for followup consultation of her memory loss, OSA, twitching, RLS and PLMs. She is unaccompanied today. I last saw her on 04/28/2015, at which time she reported problems with her neck. She had seen a rheumatologist, Dr. Amil Amen. She had some x-rays. He agreed that she may have fibromyalgia. She had seen Dr. Maryjean Ka in pain management and different treatment options were discussed including injections. She wanted to avoid injections. She was doing some neck exercises she had found online. Local heat and TENS were helpful temporarily. She did not pursue outpatient physical therapy. She reported ongoing good results with CPAP therapy and was compliant with treatment.  Today, 10/28/2015: I reviewed her CPAP compliance data from 09/27/2015 through 10/26/2015 which is a total of 30 days during which time she used her machine 28 days with percent used days greater than 4 hours at 90%, indicating excellent compliance with an average usage of 5 hours and 56 minutes, residual AHI 2 per hour, leak low with the 95th percentile at 5.2 L/m on a pressure of 7 cm with EPR of 3.  Today, 10/28/2015: She reports that she had URI symptoms and pneumonia in late August or September, went to an UC, was treated with ABx injections, nebulizer Rx and oral antibiotic. She was not able to use CPAP for a period of about 6 weeks, she needs new supplies. She has trouble with the headgear, it seems to wear out quickly, but insurance only covers it every 6 months, as I understand. No recent issues with twitching of muscles, has ongoing issues with neck  pain.  Previously:  I saw her on 10/28/2014, at which time she reported still struggling to keep the CPAP on at night. She was having ongoing issues with neck pain and shoulder pain. She had no significant issues with muscle twitching. She felt that overall she had improved and her muscle twitches had improved after starting CPAP therapy. She had new medication for her blood pressure. She had noticed some lower extremity swelling. She reported a diagnosis of fibromyalgia years ago by Dr. Erling Cruz. She had never seen a rheumatologist for this. I suggested a referral to rheumatology.  I reviewed her CPAP compliance data from 03/27/2015 through 04/25/2015 which is a total of 30 days during which time she used her machine 27 days, with percent used days greater than 4 hours at 83%, indicating very good compliance with an average usage of 6 hours and 37 minutes with days on treatment. Residual AHI low at 1.4 per hour, leaked low with the 95th percentile at 7.8 L/m on a pressure of 7 cm with EPR of 3.  I saw her on 04/27/2014, at which time she reported problems with her CPAP machine headgear. Otherwise she was feeling well and improved with ongoing use of CPAP. She had recently noted some blood in her stool and had some abdominal pain and had an appointment with her primary care physician the same day. She reported intermittent neck pain and some neck stiffness, occasional left arm jerking, mild short-term memory issues and some lower extremity edema. I suggested no new medications and encouraged her to  continue using CPAP.  I reviewed her compliance data from 09/25/2014 10/24/2014 which is a total of 30 days during which time she used her machine 26 days with percent used days greater than 4 hours of 77%, indicating adequate compliance with an average usage of 5 hours and 29 minutes, residual AHI acceptable at 2.3 per hour and leak acceptable at 7.3 L/m at the 95th percentile on a pressure of 7 cm with EPR of  3.  I saw her on 10/27/2013, at which time she reported adjusting to CPAP better than she had anticipated. She had noted an improvement in her muscle jerks, and her energy level and was overall quite pleased with how she was doing with CPAP and pleasantly surprised. After stopping hydrochlorothiazide she had noticed an increase in her ankle edema. She had recently had thyroid function tested.  I reviewed her compliance data from 03/24/2014 through 04/22/2014 which is a total of 30 days during which time she used her machine 26 days. Percent used days greater than 4 hours was 87%, indicating very good compliance. Average usage was 5 hours and 40 minutes. Residual AHI at 1.9 per hour and leak was low at 5.5 L per minute for the 95th percentile.  I saw her on 09/10/2013, at which time she reported more daytime somnolence and jerking of her muscles on the left side and worsening of her short-term memory. She was previously diagnosed with sleep apnea but was not on treatment with CPAP at the time. I suggested a sleep study. She had a split-night sleep study on 09/10/2013, and I went over her sleep test results with her in detail today: Baseline sleep efficiency was reduced at 69.5% with a latency to sleep of 12.5 minutes and wake after sleep onset of 42 minutes with severe sleep fragmentation noted. She had an elevated arousal index. She had an increased percentage of stage I and stage II sleep, and absence of deep sleep and REM sleep are to CPAP. She had occasional PVCs. She had moderate periodic leg movements of sleep at 44 per hour with an associated arousal index of 3.4 per hour. She had mild snoring. She had a total AHI of 19.8 per hour. Baseline oxygen saturation was 92%, nadir was 81%. She was then started on CPAP and titrated from 5-7 cm. Her AHI was reduced to 0 events per hour at the final pressure with supine REM sleep achieved. She had an improvement in her arousal index. She had normal stage I sleep,  an increased percentage of stage II sleep, absence of slow-wave sleep and an increased percentage of REM sleep at 30%. Average oxygen saturation was improved at 96%, nadir was 93%, much improved. Snoring was a eliminated. She had very mild periodic leg movements of sleep posts CPAP with 8.5 per hour and an associated arousal index of 0 per hour. Based on the test results I started her on CPAP at 7 cm of water pressure. I reviewed the patient's CPAP compliance data from 09/26/2013 to 09/30/2013, which is a total of 5 days, during which time the patient used CPAP every day. The average usage for all days was 6 hours and 40 minutes. The percent used days greater than 4 hours was 100 %, indicating excellent compliance in the first 5 days. The residual AHI was 3.2 per hour, indicating an appropriate treatment pressure of 7 cwp with EPR of 3.   I reviewed her compliance data from 09/26/2013 through 10/26/2013 which is a total of 31  days during which time she used CPAP every night. Percent used days greater than 4 hours was 94%, indicating excellent compliance. Average usage for all days was 6 hours and 16 minutes. Residual AHI was 2.9 per hour at a pressure of 7 cm with EPR of 3. 95th percentile of leak was 6.4 L per minute indicating a very low leak.   I first met her on 03/11/2013 for routine followup, at which time I did not suggest any medication changes. She had been sleeping better after her GI Dr. Ricard Dillon her to [facility]. I did suggest that she use her cane as needed due to muscle fatigability and lack of energy reported and evidence of slight insecurity with standing and walking.   She previously followed with Dr. Morene Antu and was last seen by him on 10/04/2012, at which time he requested a repeat sleep study and blood work.   She was unable to tolerate CPAP with an underlying history of OSA in the past. She had trouble falling asleep and also endorsed RLS symptoms. She was diagnosed with fibromyalgia at the  polio clinic in [place], [place]. Dr. Erling Cruz started seeing her in November 2009. She has been complaining of postnasal drip and constant nasal drainage for which she has seen ENT. She has also seen an allergist. She has been tried on Astelin, Nasonex, Singulair, Allegra and Ambien as well as steroids. She has joint pain particularly right hip pain. She also has right knee pain. She has had some memory loss. She was diagnosed with reflux and was placed on Dexilant, after which she was sleeping much better, including much less nasal drainage and much less post-nasal drip. She had to stop Baclofen, which was very sedating to her. She has been having trouble focusing. Her memory has been stable and she has done well with regards to her motor weakness. She has had muscle spasms in the L arm and describes intermittent L hand jerking. She has fallen in the past.  Her Past Medical History Is Significant For: Past Medical History  Diagnosis Date  . Arthritis   . Cataract   . Depression   . Blood transfusion without reported diagnosis   . GERD (gastroesophageal reflux disease)   . Ulcer   . Hypertension   . Neuromuscular disorder ([place])   . Polio   . Immunization, tetanus-diphtheria 10/2002  . Encounter for Zostavax administration 02/10/2008  . Pneumococcal vaccination given 01/2009  . Fibromyalgia     Her Past Surgical History Is Significant For: Past Surgical History  Procedure Laterality Date  . Appendectomy    . Cholecystectomy    . Eye surgery    . Abdominal hysterectomy      Her Family History Is Significant For: Family History  Problem Relation Age of Onset  . Rheum arthritis Mother   . Liver disease Father   . Cancer Sister     Lung    Her Social History Is Significant For: Social History   Social History  . Marital Status: Married    Spouse Name: Will Bonnet  . Number of Children: 2  . Years of Education: College   Occupational History  . Retired    Social  History Main Topics  . Smoking status: Never Smoker   . Smokeless tobacco: Never Used  . Alcohol Use: No  . Drug Use: No  . Sexual Activity: Yes   Other Topics Concern  . None   Social History Narrative   Pt lives at home  with spouse.   Caffeine Use: quit 09/2012   Patient is right handed    Her Allergies Are:  Allergies  Allergen Reactions  . Ciprofloxacin     REACTION: rash/itch  . Loratadine-Pseudoephedrine Er     REACTION: tachycardia  . Prednisone     REACTION: high dose taper  :   Her Current Medications Are:  Outpatient Encounter Prescriptions as of 10/28/2015  Medication Sig  . amLODipine (NORVASC) 5 MG tablet   . dexlansoprazole (DEXILANT) 60 MG capsule Take 60 mg by mouth daily.   . hydrochlorothiazide (MICROZIDE) 12.5 MG capsule   . levothyroxine (SYNTHROID, LEVOTHROID) 137 MCG tablet   . losartan (COZAAR) 25 MG tablet   . sertraline (ZOLOFT) 50 MG tablet Take 50 mg by mouth daily.  . traMADol (ULTRAM) 50 MG tablet   . zolpidem (AMBIEN) 10 MG tablet   . [DISCONTINUED] verapamil (VERELAN) 100 MG 24 hr capsule    No facility-administered encounter medications on file as of 10/28/2015.  :  Review of Systems:  Out of a complete 14 point review of systems, all are reviewed and negative with the exception of these symptoms as listed below:   Review of Systems  Neurological:       Patient reports that she is having trouble getting new supplies from [facility]. Needs new prescription.     Objective:  Neurologic Exam  Physical Exam Physical Examination:   Filed Vitals:   10/28/15 1148  BP: 150/78  Pulse: 70  Resp: 16   General Examination: The patient is a very pleasant 75 y.o. female in no acute distress. She appears well-developed and well-nourished and well groomed. She is in good spirits today.  HEENT: Normocephalic, atraumatic, pupils are very slightly unequal, R>L (unchanged), round and reactive to light and accommodation. Extraocular tracking is good  without limitation to gaze excursion or nystagmus noted. Normal smooth pursuit is noted. Hearing is grossly intact. Funduscopic exam reveals normal fundus and normal vessels. Face is symmetric with normal facial animation and normal facial sensation. Speech is clear with no dysarthria noted. There is no hypophonia. There is no lip, neck/head, jaw or voice tremor. Neck is supple with full range of passive and active motion. She has good shoulder strength. There are no carotid bruits on auscultation. Oropharynx exam reveals: mild mouth dryness, adequate dental hygiene and mild airway crowding, due to narrow anatomy. Mallampati is class I. Tongue protrudes centrally and palate elevates symmetrically.   Chest: Clear to auscultation without wheezing, rhonchi or crackles noted.  Heart: S1+S2+0, regular and normal without murmurs, rubs or gallops noted.   Abdomen: Soft, non-tender and non-distended with normal bowel sounds appreciated on auscultation.  Extremities: There is trace puffiness in the distal lower extremities bilaterally, left more than right. Pedal pulses are intact.  Skin: Warm and dry without trophic changes noted. There are mild varicose veins and prominent spider veins throughout her legs, unchanged  Musculoskeletal: exam reveals no obvious joint deformities, tenderness or joint swelling or erythema.   Neurologically:  Mental status: The patient is awake, alert and oriented in all 4 spheres. Her memory, attention, language and knowledge are fairly well preserved. She is able to give a fairly precise history. There is no aphasia, agnosia, apraxia or anomia. Speech is clear with normal prosody and enunciation. Thought process is linear. Mood is congruent and affect is normal.  Cranial nerves are as described above under HEENT exam. In addition, shoulder shrug is normal with equal shoulder height noted. Motor exam:  Normal bulk, strength and tone is noted. There is no drift, tremor or rebound,  with the exception of a slight postural tremor in the left hand only, unchanged. Romberg is negative. Reflexes are 2+ throughout. Fine motor skills are intact with normal finger taps, normal hand movements, normal rapid alternating patting, normal foot taps and normal foot agility. No myoclonus or other involuntary movements are noted. Cerebellar testing shows no dysmetria or intention tremor on finger to nose testing. Heel to shin is slightly difficult for the left. There is no truncal or gait ataxia.  Sensory exam is intact to light touch, pinprick, vibration, temperature sense in the upper and lower extremities, with the exception of slightly decreased pinprick sensation in her distal left lower extremity, unchanged. Gait, station and balance: She stands up with mild difficulty and her stance initially is slightly wide-based. She walks slightly insecurely with a slightly wider base. No problems turning noted. Posture is age-appropriate. She has initial difficulty with tandem walk.  Assessment and Plan:   In summary, Erica Davidson is a very pleasant 75 year old female with a history of post-polio syndrome, memory complaints, myoclonus and OSA, treated well with CPAP at a pressure of 7 cwp with stable exam, good results with CPAP and good compliance. She continues to struggle with keeping the mask on all night but does endorse ongoing improvement of her muscle twitching. She sleeps fairly well at this time. She has some ongoing issues with neck pain. She has been trying local heat and TENS unit for this. She has seen Dr. Maryjean Ka for this but could not pursue physical therapy and decided not to pursue injection treatment. Her lower extremity swelling is the same. She had a recent change in her blood pressure medication and is advised to monitor her swelling. From my end of things she has been doing well. She is commended for being compliant with CPAP therapy. I would like to see her back in 6 months, sooner  if the need arises. We re-visited her sleep study results and reviewed her most recent compliance data together.  she has had some trouble getting supplies including her headgear. It seems to wear out quickly, I would like for her to be able to get a new headgear every 3 months. I placed an order for CPAP supplies. If needed, we can certainly provide a letter of support for her insurance to see if they would cover a new headgear every 3 months. I answered all her questions today and the patient was in agreement with the plan. I spent 25 minutes in total face-to-face time with the patient, more than 50% of which was spent in counseling and coordination of care, reviewing test results, reviewing medication and discussing or reviewing the diagnosis of OSA, neck pain, twitching of muscle, the prognosis and treatment options.

## 2015-10-28 NOTE — Patient Instructions (Signed)
Please continue using your CPAP regularly. While your insurance requires that you use CPAP at least 4 hours each night on 70% of the nights, I recommend, that you not skip any nights and use it throughout the night if you can. Getting used to CPAP and staying with the treatment long term does take time and patience and discipline. Untreated obstructive sleep apnea when it is moderate to severe can have an adverse impact on cardiovascular health and raise her risk for heart disease, arrhythmias, hypertension, congestive heart failure, stroke and diabetes. Untreated obstructive sleep apnea causes sleep disruption, nonrestorative sleep, and sleep deprivation. This can have an impact on your day to day functioning and cause daytime sleepiness and impairment of cognitive function, memory loss, mood disturbance, and problems focussing. Using CPAP regularly can improve these symptoms.  Keep up the good work! I will see you back in 6 months.

## 2015-11-23 ENCOUNTER — Telehealth: Payer: Self-pay | Admitting: Neurology

## 2015-11-23 DIAGNOSIS — Z9989 Dependence on other enabling machines and devices: Principal | ICD-10-CM

## 2015-11-23 DIAGNOSIS — G4733 Obstructive sleep apnea (adult) (pediatric): Secondary | ICD-10-CM

## 2015-11-23 NOTE — Telephone Encounter (Signed)
Pt called in with her insurance on the phone as well. Pt wanted her insurance to know she is to have new head gear every 3 months as noted in pt chart. They are requesting a letter stating as such. May call pts insurance [facility] [phone], or [phone].

## 2015-11-24 NOTE — Telephone Encounter (Signed)
I [facility] to see what the patient's [facility] covers.

## 2015-12-01 NOTE — Telephone Encounter (Signed)
[facility] to see if any word yet?

## 2015-12-01 NOTE — Telephone Encounter (Signed)
Mary emailed back and stated that the [facility] at [facility] have been in contact with patient and advised her to call insurance to see if they agree. I will contact patient for more details.

## 2015-12-02 NOTE — Telephone Encounter (Signed)
LM on both numbers for patient to call back. I need a little more information like is the head gear the only part that she is requesting? Also what is the reason patient needs new headgear every 3 month (does it lose shape or does it not keep the mask sealed to the face?)?

## 2015-12-06 NOTE — Telephone Encounter (Signed)
Okay to have headgear every 3 months for CPAP mask. I am not sure if her ins will pay [id number], but will put order in.

## 2015-12-06 NOTE — Telephone Encounter (Signed)
Ok with you to ask if she can get new head gear every 3 months?

## 2015-12-06 NOTE — Telephone Encounter (Signed)
Patient is calling back and states that the head gear is the only part she is requesting. She needs the new head gear as the straps don't even last for a 3 month period, they become so loose that it is difficult for her to keep the gear on and tight enough to use. Please call her if I didn't get all the info you need.  Thanks!

## 2015-12-14 ENCOUNTER — Other Ambulatory Visit: Payer: Self-pay | Admitting: Allergy and Immunology

## 2015-12-14 NOTE — Telephone Encounter (Signed)
Sent staff message to Shirlean SchleinMary Ozimek [facility] that new order placed. Awaiting reply.

## 2015-12-14 NOTE — Telephone Encounter (Signed)
Patient needs office visit.  

## 2015-12-14 NOTE — Telephone Encounter (Signed)
Shirlean SchleinMary Ozimek sent staff message back. She is looking into this. Awaiting reply.

## 2015-12-16 NOTE — Telephone Encounter (Signed)
[facility]. Gave [facility] phone number. Please relay message regarding head gear per Shirlean SchleinMary Ozimek if pt calls back.

## 2015-12-16 NOTE — Telephone Encounter (Signed)
Per Shirlean SchleinMary Ozimek- "Ok, so I found out that her insurance allows us to bill for new straps every 6 months. She has the option of talking with her insurance company for an exception if she really does need them sooner than that, but it is up to them."

## 2016-01-04 ENCOUNTER — Encounter: Payer: Self-pay | Admitting: Allergy and Immunology

## 2016-01-04 ENCOUNTER — Ambulatory Visit (INDEPENDENT_AMBULATORY_CARE_PROVIDER_SITE_OTHER): Payer: Medicare Other | Admitting: Allergy and Immunology

## 2016-01-04 VITALS — BP 148/92 | HR 80 | Resp 18 | Ht 62.4 in | Wt 141.1 lb

## 2016-01-04 DIAGNOSIS — S70361D Insect bite (nonvenomous), right thigh, subsequent encounter: Secondary | ICD-10-CM

## 2016-01-04 DIAGNOSIS — J387 Other diseases of larynx: Secondary | ICD-10-CM | POA: Diagnosis not present

## 2016-01-04 DIAGNOSIS — W57XXXD Bitten or stung by nonvenomous insect and other nonvenomous arthropods, subsequent encounter: Secondary | ICD-10-CM | POA: Diagnosis not present

## 2016-01-04 DIAGNOSIS — K219 Gastro-esophageal reflux disease without esophagitis: Secondary | ICD-10-CM

## 2016-01-04 MED ORDER — DOXYCYCLINE MONOHYDRATE 100 MG PO CAPS: ORAL_CAPSULE | ORAL | Status: DC

## 2016-01-04 MED ORDER — DEXLANSOPRAZOLE 60 MG PO CPDR: 60.0000 mg | DELAYED_RELEASE_CAPSULE | Freq: Every day | ORAL | Status: DC

## 2016-01-04 NOTE — Progress Notes (Signed)
Follow-up Note  Referring Provider: Merri Davidson, Candace, MD Primary Provider: Allean FoundSMITH,Erica Davidson THIELE, MD Date of Office Visit: 01/04/2016  Subjective:   Erica Davidson (DOB: [birth year]) is a 75 y.o. female who [facility] in re-evaluation of the following:  HPI: Erica Davidson returns to this clinic in evaluation of her LPR. While modifying her caffeine and chocolate consumption and consistently using a proton pump inhibitor she's had a rather dramatic response to medical therapy directed against her LPR and chronic cough. At this point in time she is very satisfied with the response she is received while using only Dexilant 60 mg 1 time per day. She no longer uses any additional ranitidine.  Erica Davidson states that she was bit by a tick on her right posterior thigh approximately one week ago and has developed a local rash over this week since removal of the tick.    Medication List           amLODipine 5 MG tablet  Commonly known as:  NORVASC     dexlansoprazole 60 MG capsule  Commonly known as:  DEXILANT  Take 1 capsule (60 mg total) by mouth daily.     hydrochlorothiazide 12.5 MG capsule  Commonly known as:  MICROZIDE     levothyroxine 125 MCG tablet  Commonly known as:  SYNTHROID, LEVOTHROID     losartan 25 MG tablet  Commonly known as:  COZAAR     sertraline 50 MG tablet  Commonly known as:  ZOLOFT  Take 50 mg by mouth daily.     traMADol 50 MG tablet  Commonly known as:  ULTRAM  as needed.        Past Medical History  Diagnosis Date  . Arthritis   . Cataract   . Depression   . Blood transfusion without reported diagnosis   . GERD (gastroesophageal reflux disease)   . Ulcer   . Hypertension   . Neuromuscular disorder (HCC)   . Polio   . Immunization, tetanus-diphtheria 10/2002  . Encounter for Zostavax administration 02/10/2008  . Pneumococcal vaccination given 01/2009  . Fibromyalgia   . Diverticulosis   . Hemorrhoids,  internal   . Colitis     Past Surgical History  Procedure Laterality Date  . Appendectomy    . Cholecystectomy    . Eye surgery    . Abdominal hysterectomy      Allergies  Allergen Reactions  . Ciprofloxacin     REACTION: rash/itch  . Loratadine-Pseudoephedrine Er     REACTION: tachycardia  . Prednisone     REACTION: high dose taper    Review of systems negative except as noted in HPI / PMHx or noted below:  Review of Systems  Constitutional: Negative.   HENT: Negative.   Eyes: Negative.   Respiratory: Negative.   Cardiovascular: Negative.   Gastrointestinal: Negative.   Genitourinary: Negative.   Musculoskeletal: Negative.   Skin: Negative.   Neurological: Negative.   Endo/Heme/Allergies: Negative.   Psychiatric/Behavioral: Negative.      Objective:   Filed Vitals:   01/04/16 1607  BP: 148/92  Pulse: 80  Resp: 18   Height: 5' 2.4" (158.5 cm)  Weight: 141 lb 1.5 oz (64 kg)   Physical Exam  Constitutional: She is well-developed, well-nourished, and in no distress.  HENT:  Head: Normocephalic.  Right Ear: Tympanic membrane, external ear and ear canal normal.  Left Ear: Tympanic membrane, external ear and ear canal normal.  Nose: Nose  normal. No mucosal edema or rhinorrhea.  Mouth/Throat: Uvula is midline, oropharynx is clear and moist and mucous membranes are normal. No oropharyngeal exudate.  Eyes: Conjunctivae are normal.  Neck: Trachea normal. No tracheal tenderness present. No tracheal deviation present. No thyromegaly present.  Cardiovascular: Normal rate, regular rhythm, S1 normal, S2 normal and normal heart sounds.   No murmur heard. Pulmonary/Chest: Breath sounds normal. No stridor. No respiratory distress. She has no wheezes. She has no rales.  Musculoskeletal: She exhibits no edema.  Lymphadenopathy:       Head (right side): No tonsillar adenopathy present.       Head (left side): No tonsillar adenopathy present.    She has no cervical  adenopathy.  Neurological: She is alert. Gait normal.  Skin: Rash (10 cm diameter erythematous dermatitis extending from tick bite site right posterior thigh) noted. She is not diaphoretic. No erythema. Nails show no clubbing.  Psychiatric: Mood and affect normal.    Diagnostics: None   Assessment and Plan:   1. LPRD (laryngopharyngeal reflux disease)   2. Tick bite of right thigh with local reaction, subsequent encounter     1. Continue Dexilant 60 mg 1 time per day: 3 month prescription for one year  2. Doxycycline 100 mg twice a day for the next 14 days  3. Follow up with Dr. Merri Brunette  4. Return to clinic if needed  I do not think that there is a need for [name] to return to this clinic concerning her LPR. She can follow up with Dr. Merri Brunette concerning further evaluation and treatment of this issue. Certainly if things get out of hand as she moves forward she is welcome to return to this clinic for further evaluation. She does appear to have a local inflammatory dermatitis from her tick bite site and I have given her doxycycline 100 mg twice a day as prophylaxis against Lyme disease and other tickborne diseases.  [name], MD [facility]

## 2016-01-04 NOTE — Patient Instructions (Addendum)
  1. Continue Dexilant 60 mg 1 time per day. 3 month prescription for one year  2. Doxycycline 100 mg twice a day for the next 14 days  3. Follow up with Dr. Merri Brunetteandace Smith  4. Return to clinic if needed

## 2016-02-22 ENCOUNTER — Ambulatory Visit
Admission: RE | Admit: 2016-02-22 | Discharge: 2016-02-22 | Disposition: A | Discharge status: Home / Self Care | Payer: Medicare Other | Source: Ambulatory Visit | Attending: Family Medicine | Admitting: Family Medicine

## 2016-02-22 ENCOUNTER — Other Ambulatory Visit: Payer: Self-pay | Admitting: Family Medicine

## 2016-02-22 DIAGNOSIS — M545 Low back pain, unspecified: Secondary | ICD-10-CM

## 2016-02-22 DIAGNOSIS — M79604 Pain in right leg: Secondary | ICD-10-CM

## 2016-04-21 ENCOUNTER — Encounter: Payer: Self-pay | Admitting: Neurology

## 2016-04-26 ENCOUNTER — Ambulatory Visit: Payer: Medicare Other | Admitting: Neurology

## 2016-06-12 ENCOUNTER — Other Ambulatory Visit: Payer: Self-pay | Admitting: Family Medicine

## 2016-06-12 DIAGNOSIS — R002 Palpitations: Secondary | ICD-10-CM

## 2016-06-22 ENCOUNTER — Ambulatory Visit (HOSPITAL_COMMUNITY): Payer: Medicare Other | Attending: Cardiovascular Disease

## 2016-06-22 ENCOUNTER — Other Ambulatory Visit: Payer: Self-pay

## 2016-06-22 DIAGNOSIS — G14 Postpolio syndrome: Secondary | ICD-10-CM | POA: Insufficient documentation

## 2016-06-22 DIAGNOSIS — G4733 Obstructive sleep apnea (adult) (pediatric): Secondary | ICD-10-CM | POA: Insufficient documentation

## 2016-06-22 DIAGNOSIS — R002 Palpitations: Secondary | ICD-10-CM

## 2016-06-22 DIAGNOSIS — I10 Essential (primary) hypertension: Secondary | ICD-10-CM | POA: Diagnosis not present

## 2016-06-22 MED ORDER — PERFLUTREN LIPID MICROSPHERE
1.0000 mL | INTRAVENOUS | Status: AC | PRN
  Administered 2016-06-22: 2 mL via INTRAVENOUS

## 2016-12-26 ENCOUNTER — Ambulatory Visit
Admission: RE | Admit: 2016-12-26 | Discharge: 2016-12-26 | Disposition: A | Discharge status: Home / Self Care | Payer: Medicare Other | Source: Ambulatory Visit | Attending: Family Medicine | Admitting: Family Medicine

## 2016-12-26 ENCOUNTER — Other Ambulatory Visit: Payer: Self-pay | Admitting: Family Medicine

## 2016-12-26 DIAGNOSIS — M545 Low back pain, unspecified: Secondary | ICD-10-CM

## 2017-04-26 ENCOUNTER — Encounter: Payer: Self-pay | Admitting: Neurology

## 2017-04-26 ENCOUNTER — Telehealth: Payer: Self-pay | Admitting: Neurology

## 2017-04-26 ENCOUNTER — Ambulatory Visit (INDEPENDENT_AMBULATORY_CARE_PROVIDER_SITE_OTHER): Payer: Medicare Other | Admitting: Neurology

## 2017-04-26 VITALS — BP 180/85 | HR 88 | Ht 62.4 in | Wt 144.0 lb

## 2017-04-26 DIAGNOSIS — Z9989 Dependence on other enabling machines and devices: Secondary | ICD-10-CM

## 2017-04-26 DIAGNOSIS — G4733 Obstructive sleep apnea (adult) (pediatric): Secondary | ICD-10-CM | POA: Diagnosis not present

## 2017-04-26 NOTE — Progress Notes (Signed)
Subjective:    Patient ID: Erica Davidson is a 76 y.o. female.  HPI     Interim history:   Erica Davidson is a very pleasant 76 year old right handed woman with an underlying medical history of post polio syndrome with history of polio at age 4, hypothyroidism, hypertension, hyperlipidemia, fibromyalgia, tremor, neck pain and right knee pain and allergies, who presents for followup consultation of her OSA, and RLS with PLMs. She is unaccompanied today. I last saw her on 10/28/2015, at which time she was compliant with her CPAP. She reported a recent upper respiratory infection for which she was treated with nebulizer, and antibiotics. She was not able to use her CPAP during a period of about 6 weeks. She needed new CPAP supplies.  Today, 04/26/2017: I could not review her CPAP compliance data d/t no card available. She is okay with bringing by the card at some point when she is in this area. She reports increase in fatigue and sleepiness. She does not sleep very well at night. She does take a nap typically on a day-to-day basis. She has had forgetfulness. She has noticed some increase in her blood pressure values recently in the past month. She typically monitors this at home.   Previously:  I saw her on 04/28/2015, at which time she reported problems with her neck. She had seen a rheumatologist, Dr. Amil Amen. She had some x-rays. He agreed that she may have fibromyalgia. She had seen Dr. Maryjean Ka in pain management and different treatment options were discussed including injections. She wanted to avoid injections. She was doing some neck exercises she had found online. Local heat and TENS were helpful temporarily. She did not pursue outpatient physical therapy. She reported ongoing good results with CPAP therapy and was compliant with treatment.   I reviewed her CPAP compliance data from 09/27/2015 through 10/26/2015 which is a total of 30 days during which time she used her machine 28 days with percent  used days greater than 4 hours at 90%, indicating excellent compliance with an average usage of 5 hours and 56 minutes, residual AHI 2 per hour, leak low with the 95th percentile at 5.2 L/m on a pressure of 7 cm with EPR of 3.     I saw her on 10/28/2014, at which time she reported still struggling to keep the CPAP on at night. She was having ongoing issues with neck pain and shoulder pain. She had no significant issues with muscle twitching. She felt that overall she had improved and her muscle twitches had improved after starting CPAP therapy. She had new medication for her blood pressure. She had noticed some lower extremity swelling. She reported a diagnosis of fibromyalgia years ago by Dr. Erling Cruz. She had never seen a rheumatologist for this. I suggested a referral to rheumatology.   I reviewed her CPAP compliance data from 03/27/2015 through 04/25/2015 which is a total of 30 days during which time she used her machine 27 days, with percent used days greater than 4 hours at 83%, indicating very good compliance with an average usage of 6 hours and 37 minutes with days on treatment. Residual AHI low at 1.4 per hour, leaked low with the 95th percentile at 7.8 L/m on a pressure of 7 cm with EPR of 3.   I saw her on 04/27/2014, at which time she reported problems with her CPAP machine headgear. Otherwise she was feeling well and improved with ongoing use of CPAP. She had recently noted some blood in  her stool and had some abdominal pain and had an appointment with her primary care physician the same day. She reported intermittent neck pain and some neck stiffness, occasional left arm jerking, mild short-term memory issues and some lower extremity edema. I suggested no new medications and encouraged her to continue using CPAP.   I reviewed her compliance data from 09/25/2014 10/24/2014 which is a total of 30 days during which time she used her machine 26 days with percent used days greater than 4 hours of 77%,  indicating adequate compliance with an average usage of 5 hours and 29 minutes, residual AHI acceptable at 2.3 per hour and leak acceptable at 7.3 L/m at the 95th percentile on a pressure of 7 cm with EPR of 3.   I saw her on 10/27/2013, at which time she reported adjusting to CPAP better than she had anticipated. She had noted an improvement in her muscle jerks, and her energy level and was overall quite pleased with how she was doing with CPAP and pleasantly surprised. After stopping hydrochlorothiazide she had noticed an increase in her ankle edema. She had recently had thyroid function tested.   I reviewed her compliance data from 03/24/2014 through 04/22/2014 which is a total of 30 days during which time she used her machine 26 days. Percent used days greater than 4 hours was 87%, indicating very good compliance. Average usage was 5 hours and 40 minutes. Residual AHI at 1.9 per hour and leak was low at 5.5 L per minute for the 95th percentile.   I saw her on 09/10/2013, at which time she reported more daytime somnolence and jerking of her muscles on the left side and worsening of her short-term memory. She was previously diagnosed with sleep apnea but was not on treatment with CPAP at the time. I suggested a sleep study. She had a split-night sleep study on 09/10/2013, and I went over her sleep test results with her in detail today: Baseline sleep efficiency was reduced at 69.5% with a latency to sleep of 12.5 minutes and wake after sleep onset of 42 minutes with severe sleep fragmentation noted. She had an elevated arousal index. She had an increased percentage of stage I and stage II sleep, and absence of deep sleep and REM sleep are to CPAP. She had occasional PVCs. She had moderate periodic leg movements of sleep at 44 per hour with an associated arousal index of 3.4 per hour. She had mild snoring. She had a total AHI of 19.8 per hour. Baseline oxygen saturation was 92%, nadir was 81%. She was then  started on CPAP and titrated from 5-7 cm. Her AHI was reduced to 0 events per hour at the final pressure with supine REM sleep achieved. She had an improvement in her arousal index. She had normal stage I sleep, an increased percentage of stage II sleep, absence of slow-wave sleep and an increased percentage of REM sleep at 30%. Average oxygen saturation was improved at 96%, nadir was 93%, much improved. Snoring was a eliminated. She had very mild periodic leg movements of sleep posts CPAP with 8.5 per hour and an associated arousal index of 0 per hour. Based on the test results I started her on CPAP at 7 cm of water pressure. I reviewed the patient's CPAP compliance data from 09/26/2013 to 09/30/2013, which is a total of 5 days, during which time the patient used CPAP every day. The average usage for all days was 6 hours and 40 minutes. The  percent used days greater than 4 hours was 100 %, indicating excellent compliance in the first 5 days. The residual AHI was 3.2 per hour, indicating an appropriate treatment pressure of 7 cwp with EPR of 3.   I reviewed her compliance data from 09/26/2013 through 10/26/2013 which is a total of 31 days during which time she used CPAP every night. Percent used days greater than 4 hours was 94%, indicating excellent compliance. Average usage for all days was 6 hours and 16 minutes. Residual AHI was 2.9 per hour at a pressure of 7 cm with EPR of 3. 95th percentile of leak was 6.4 L per minute indicating a very low leak.   I first met her on 03/11/2013 for routine followup, at which time I did not suggest any medication changes. She had been sleeping better after her GI Dr. Ricard Dillon her to [facility] I did suggest that she use her cane as needed due to muscle fatigability and lack of energy reported and evidence of slight insecurity with standing and walking.   She previously followed with Dr. Morene Antu and was last seen by him on 10/04/2012, at which time he requested a repeat  sleep study and blood work.   She was unable to tolerate CPAP with an underlying history of OSA in the past. She had trouble falling asleep and also endorsed RLS symptoms. She was diagnosed with fibromyalgia at the polio clinic in [place], [place]. Dr. Erling Cruz started seeing her in November 2009. She has been complaining of postnasal drip and constant nasal drainage for which she has seen ENT. She has also seen an allergist. She has been tried on Astelin, Nasonex, Singulair, Allegra and Ambien as well as steroids. She has joint pain particularly right hip pain. She also has right knee pain. She has had some memory loss. She was diagnosed with reflux and was placed on Dexilant, after which she was sleeping much better, including much less nasal drainage and much less post-nasal drip. She had to stop Baclofen, which was very sedating to her. She has been having trouble focusing. Her memory has been stable and she has done well with regards to her motor weakness. She has had muscle spasms in the L arm and describes intermittent L hand jerking. She has fallen in the past.  Her Past Medical History Is Significant For: Past Medical History:  Diagnosis Date  . Arthritis   . Blood transfusion without reported diagnosis   . Cataract   . Colitis   . Depression   . Diverticulosis   . Encounter for Zostavax administration 02/10/2008  . Fibromyalgia   . GERD (gastroesophageal reflux disease)   . Hemorrhoids, internal   . Hypertension   . Immunization, tetanus-diphtheria 10/2002  . Neuromuscular disorder ([place])   . Pneumococcal vaccination given 01/2009  . Polio   . Ulcer ([place])     Her Past Surgical History Is Significant For: Past Surgical History:  Procedure Laterality Date  . ABDOMINAL HYSTERECTOMY    . APPENDECTOMY    . CHOLECYSTECTOMY    . EYE SURGERY      Her Family History Is Significant For: Family History  Problem Relation Age of Onset  . Rheum arthritis Mother   . Liver disease  Father   . Cancer Sister        Lung    Her Social History Is Significant For: Social History   Social History  . Marital status: Married    Spouse name: Will Bonnet  .  Number of children: 2  . Years of education: College   Occupational History  . Retired    Social History Main Topics  . Smoking status: Never Smoker  . Smokeless tobacco: Never Used  . Alcohol use No  . Drug use: No  . Sexual activity: Yes   Other Topics Concern  . Not on file   Social History Narrative   Pt lives at home with spouse.   Caffeine Use: quit 09/2012   Patient is right handed    Her Allergies Are:  Allergies  Allergen Reactions  . Zithromax [Azithromycin] Shortness Of Breath  . Atrovent [Ipratropium] Other (See Comments)    Dizziness; confusion.  . Ciprofloxacin     REACTION: rash/itch  . Loratadine-Pseudoephedrine Er     REACTION: tachycardia  . Penicillins   . Prednisone     REACTION: high dose taper  . Codeine Palpitations    Rapid heart rate  :   Her Current Medications Are:  Outpatient Encounter Prescriptions as of 04/26/2017  Medication Sig  . amLODipine (NORVASC) 5 MG tablet   . dexlansoprazole (DEXILANT) 60 MG capsule Take 1 capsule (60 mg total) by mouth daily.  . hydrochlorothiazide (MICROZIDE) 12.5 MG capsule   . levothyroxine (SYNTHROID, LEVOTHROID) 125 MCG tablet   . losartan (COZAAR) 25 MG tablet   . sertraline (ZOLOFT) 50 MG tablet Take 50 mg by mouth daily.  . traMADol (ULTRAM) 50 MG tablet as needed.   . [DISCONTINUED] doxycycline (MONODOX) 100 MG capsule Take one tablet by mouth twice daily for 14 days. (Patient not taking: Reported on 04/26/2017)   No facility-administered encounter medications on file as of 04/26/2017.   :  Review of Systems:  Out of a complete 14 point review of systems, all are reviewed and negative with the exception of these symptoms as listed below:  Review of Systems  Psychiatric/Behavioral: Positive for sleep disturbance.     Objective:  Neurological Exam  Physical Exam Physical Examination:   Vitals:   04/26/17 1044  BP: (!) 180/85  Pulse: 88   General Examination: The patient is a very pleasant 76 y.o. female in no acute distress. She appears well-developed and well-nourished and well groomed.   HEENT: Normocephalic, atraumatic, pupils are Reactive to light, tracking is fairly good.  no dysarthria or hypophonia noted. Oropharynx exam reveals mild to moderate mouth dryness, adequate dental hygiene and mild airway crowding. Tongue protrudes centrally and palate elevates symmetrically.   Chest: Clear to auscultation without wheezing, rhonchi or crackles noted.  Heart: S1+S2+0, regular and normal without murmurs, rubs or gallops noted.   Abdomen: Soft, non-tender and non-distended with normal bowel sounds appreciated on auscultation.  Extremities: There is trace to 1+ puffiness in the distal lower extremities bilaterally, left more than right. Pedal pulses are intact.  Skin: Warm and dry without trophic changes noted. There are mild varicose veins and prominent spider veins throughout her legs, unchanged  Musculoskeletal: exam reveals no obvious joint deformities, tenderness or joint swelling or erythema.   Neurologically:  Mental status: The patient is awake, alert and oriented in all 4 spheres. Her memory, attention, language and knowledge are fairly well preserved. She is able to give a good history. Thought process is linear. Mood is congruent and affect is normal.  Cranial nerves are as described above under HEENT exam. In addition, shoulder shrug is normal with equal shoulder height noted. Motor exam: Normal bulk, strength and tone is noted. There is no tremor. Reflexes are 1+ throughout.  Fine motor skills are grossly intact. Cerebellar testing shows no dysmetria or intention tremor. There is no truncal or gait ataxia.  Sensory exam is intact to light touch. Gait, station and balance: She  stands up with mild difficulty and her stance is slightly wide-based. She walks slightly insecurely with a slightly wider base.   Assessment and Plan:   In summary, GLORIANNA GOTT is a very pleasant 76 year old female with a history of post-polio syndrome, memory complaints, myoclonus and OSA,  on treatment with CPAP. She did not bring her compliance card today but has previously been compliant with her CPAP. She is requested to bring by her compliance data card when she is in this area so we can update her records. She is advised to talk to her primary care physician about her blood pressure elevation and slightly worsening lower extremity swelling. As far as her sleep difficulties I suggested trial of melatonin. I would not recommend a prescription sleep aid for her. I suggested she be checked for vitamin B12 deficiency, vitamin D level, and thyroid function on a regular basis. I suggested a one-year checkup, she can see one of our nurse practitioners next time. I answered all her questions today and she was in agreement. I spent 15 minutes in total face-to-face time with the patient, more than 50% of which was spent in counseling and coordination of care, reviewing test results, reviewing medication and discussing or reviewing the diagnosis of OSA, its prognosis and treatment options. Pertinent laboratory and imaging test results that were available during this visit with the patient were reviewed by me and considered in my medical decision making (see chart for details).

## 2017-04-26 NOTE — Patient Instructions (Addendum)
Please talk to your primary physician about increasing BP values and your ankle swelling. Sometimes, Amlodipine can cause ankle puffiness.   Please continue using your CPAP regularly. While your insurance requires that you use CPAP at least 4 hours each night on 70% of the nights, I recommend, that you not skip any nights and use it throughout the night if you can. Getting used to CPAP and staying with the treatment long term does take time and patience and discipline. Untreated obstructive sleep apnea when it is moderate to severe can have an adverse impact on cardiovascular health and raise her risk for heart disease, arrhythmias, hypertension, congestive heart failure, stroke and diabetes. Untreated obstructive sleep apnea causes sleep disruption, nonrestorative sleep, and sleep deprivation. This can have an impact on your day to day functioning and cause daytime sleepiness and impairment of cognitive function, memory loss, mood disturbance, and problems focussing. Using CPAP regularly can improve these symptoms.  We will do a one year check up, you can see one of our nurse practitioners.   Please see if you can drop off the CPAP compliance card/chip from your machine to our office one of these days.   You can try Melatonin at night for sleep: take 1 mg to 3 mg, one to 2 hours before your bedtime. You can go up to 5 mg if needed. It is over the counter and comes in pill form, chewable form and spray, if you prefer.    Please try to make about 7-8 hours of sleep each night.

## 2017-04-26 NOTE — Telephone Encounter (Signed)
8/23 Patient wants to call back to schedule 1 yr follow up w/ np per Dr. Vickey Huger. NB

## 2017-06-19 ENCOUNTER — Encounter: Payer: Self-pay | Admitting: Neurology

## 2017-06-19 ENCOUNTER — Telehealth: Payer: Self-pay

## 2017-06-19 NOTE — Telephone Encounter (Signed)
SENT NOTES TO SCHEDULING 

## 2017-06-21 ENCOUNTER — Telehealth: Payer: Self-pay

## 2017-06-21 NOTE — Telephone Encounter (Signed)
SENT SECOND SETS OF NOTES TO SCHEDULING

## 2017-07-03 ENCOUNTER — Encounter: Payer: Self-pay | Admitting: Internal Medicine

## 2017-07-03 ENCOUNTER — Ambulatory Visit (INDEPENDENT_AMBULATORY_CARE_PROVIDER_SITE_OTHER): Payer: Medicare Other | Admitting: Internal Medicine

## 2017-07-03 VITALS — BP 164/90 | HR 59 | Ht 62.4 in | Wt 141.0 lb

## 2017-07-03 DIAGNOSIS — I10 Essential (primary) hypertension: Secondary | ICD-10-CM | POA: Diagnosis not present

## 2017-07-03 DIAGNOSIS — I509 Heart failure, unspecified: Secondary | ICD-10-CM

## 2017-07-03 DIAGNOSIS — I5031 Acute diastolic (congestive) heart failure: Secondary | ICD-10-CM | POA: Diagnosis not present

## 2017-07-03 MED ORDER — SPIRONOLACTONE 25 MG PO TABS: 25.0000 mg | ORAL_TABLET | Freq: Every day | ORAL | 3 refills | Status: DC

## 2017-07-03 MED ORDER — MIDODRINE HCL 2.5 MG PO TABS: ORAL_TABLET | ORAL | 1 refills | Status: DC

## 2017-07-03 NOTE — Patient Instructions (Signed)
Medication Instructions: Your physician has recommended you make the following change in your medication:  -1) START Spironolactone (aldactone) 25 mg - Take 1 tablet by mouth daily -2) START Proamatine (midodrine) 2.5 mg - Take 1 tablet by mouth every 4 hours while awake if your systolic blood pressure is below 110  Labwork: None Ordered  Procedures/Testing: Your physician has requested that you have a renal artery duplex. During this test, an ultrasound is used to evaluate blood flow to the kidneys. Allow one hour for this exam. Do not eat after midnight the day before and avoid carbonated beverages. Take your medications as you usually do.  Non-Cardiac CT Angiography (CTA), is a special type of CT scan that uses a computer to produce multi-dimensional views of major blood vessels throughout the body. In CT angiography, a contrast material is injected through an IV to help visualize the blood vessels    Follow-Up: Your physician recommends that you schedule a follow-up appointment in: 4-6 WEEKS with Erica Davidson on a day that Erica Davidson is in the office   Any Additional Special Instructions Will Be Listed Below (If Applicable).     If you need a refill on your cardiac medications before your next appointment, please call your pharmacy.

## 2017-07-03 NOTE — Progress Notes (Signed)
ELECTROPHYSIOLOGY CONSULT NOTE  Patient ID: Erica Davidson, MRN: [id number], DOB/AGE: Dec 27, 1940 76 y.o. Admit date: (Not on file) Date of Consult: 07/03/2017  Primary Physician: Merri BrunetteSmith, Candace, MD Primary Cardiologist: new     Erica Davidson is a 76 y.o. female who is being seen today for the evaluation of lablile BP at the request of Dr Katrinka BlazingSmith.    HPI Erica Davidson is a 76 y.o. 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  She has long-standing issues with blood pressure but more recently has had blood pressures ranging over a span of 1 week-2 from 100 mm to 220mmHg.  Most often the high blood pressure spikes overnight.  They are associated with a sense of weakness and fatigue.  Dramatic hypotension is associated with blood pressures under 110.  She also has some gait instability and dyspnea.  This is been ascribed to the post polio syndrome.  Her graph she denies problems with constipation difficulty with urination or dry mouth.  She does have a history of dry eyes.  She has some itching but they are unassociated with the spells.  She has no diarrhea.  Echocardiogram from 2017 was reviewed.  Left ventricular wall thickness was about 11 mm; left atrial size was normal.  She has a history of diagnosis of postpolio syndrome.  I am not sure how it relates  Past Medical History:  Diagnosis Date  . Arthritis   . Blood transfusion without reported diagnosis   . Cataract   . Colitis   . Depression   . Diverticulosis   . Encounter for Zostavax administration 02/10/2008  . Fibromyalgia   . GERD (gastroesophageal reflux disease)   . Hemorrhoids, internal   . Hypertension   . Immunization, tetanus-diphtheria 10/2002  . Neuromuscular disorder (HCC)   . Pneumococcal vaccination given 01/2009  . Polio   . Ulcer       Surgical History:  Past Surgical History:  Procedure  Laterality Date  . ABDOMINAL HYSTERECTOMY    . APPENDECTOMY    . CHOLECYSTECTOMY    . EYE SURGERY       Home Meds: Prior to Admission medications   Medication Sig Start Date End Date Taking? Authorizing Provider  amLODipine (NORVASC) 5 MG tablet Take 5 mg by mouth daily.  10/18/15  Yes [provider]  dexlansoprazole (DEXILANT) 60 MG capsule Take 1 capsule (60 mg total) by mouth daily. 01/04/16  Yes Kozlow, Alvira PhilipsEric J, MD  hydrochlorothiazide (MICROZIDE) 12.5 MG capsule Take 12.5 mg by mouth daily.  03/22/15  Yes [provider]  levothyroxine (SYNTHROID, LEVOTHROID) 125 MCG tablet Take 125 mcg by mouth daily.  11/16/15  Yes [provider]  losartan (COZAAR) 50 MG tablet Take 50 mg by mouth daily.   Yes [provider]  sertraline (ZOLOFT) 50 MG tablet Take 50 mg by mouth daily.   Yes [provider]  [facility] 0.5[facility] IM UTD 06/07/17  Yes [provider]  traMADol (ULTRAM) 50 MG tablet as needed.  03/22/15  Yes [provider]    Allergies:  Allergies  Allergen Reactions  . Zithromax [Azithromycin] Shortness Of Breath  . Atrovent [Ipratropium] Other (See Comments)    Dizziness; confusion.  . Ciprofloxacin     REACTION: rash/itch  . Loratadine-Pseudoephedrine Er     REACTION: tachycardia  . Penicillins   . Prednisone  REACTION: high dose taper  . Codeine Palpitations    Rapid heart rate    Social History   Social History  . Marital status: Married    Spouse name: Starleen Blue  . Number of children: 2  . Years of education: College   Occupational History  . Retired    Social History Main Topics  . Smoking status: Never Smoker  . Smokeless tobacco: Never Used  . Alcohol use No  . Drug use: No  . Sexual activity: Yes   Other Topics Concern  . Not on file   Social History Narrative   Pt lives at home with spouse.   Caffeine Use: quit 09/2012   Patient is right handed     Family History    Problem Relation Age of Onset  . Rheum arthritis Mother   . Liver disease Father   . Cancer Sister        Lung     ROS:  Please see the history of present illness.     All other systems reviewed and negative.    Physical Exam  Blood pressure (!) 164/90, pulse (!) 59, height 5' 2.4" (1.585 m), weight 141 lb (64 kg). General: Well developed, well nourished female in no acute distress. Head: Normocephalic, atraumatic, sclera non-icteric, no xanthomas, nares are without discharge. EENT: normal  Lymph Nodes:  none Neck: Negative for carotid bruits. JVD not elevated. Back:without scoliosis kyphosis Lungs: Clear bilaterally to auscultation without wheezes, rales, or rhonchi. Breathing is unlabored. Heart: RRR with S1 S2.  2/6 systolic murmur . No rubs, or gallops appreciated. Abdomen: Soft, non-tender, non-distended with normoactive bowel sounds. No hepatomegaly. No rebound/guarding. No obvious abdominal masses. Msk:  Strength and tone appear normal for age. Extremities: No clubbing or cyanosis. No   edema.  Distal pedal pulses are 2+ and equal bilaterally. Skin: Warm and Dry Neuro: Alert and oriented X 3. CN III-XII intact Grossly normal sensory and motor function . Psych:  Responds to questions appropriately with a normal affect.      Labs: Cardiac Enzymes No results for input(s): CKTOTAL, CKMB, TROPONINI in the last 72 hours. CBC No results found for: WBC, HGB, HCT, MCV, PLT PROTIME: No results for input(s): LABPROT, INR in the last 72 hours. Chemistry No results for input(s): NA, K, CL, CO2, BUN, CREATININE, CALCIUM, PROT, BILITOT, ALKPHOS, ALT, AST, GLUCOSE in the last 168 hours.  Invalid input(s): LABALBU Lipids No results found for: CHOL, HDL, LDLCALC, TRIG BNP No results found for: PROBNP Thyroid Function Tests: No results for input(s): TSH, T4TOTAL, T3FREE, THYROIDAB in the last 72 hours.  Invalid input(s): FREET3 Miscellaneous No results found for:  DDIMER  Radiology/Studies:  No results found.  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  There is a significant component of lability.  I am not sure that I understand the physiology.  Extensive workup has excluded hyperaldosteronism, pheochromocytoma.  Including renovascular disease or renal arterial Doppler.  Given the fact that she has sudden onset of shortness of breath associated with hypertension becomes important to exclude also coronary disease.  We will undertake coronary arterial CT  In the short-term control of her blood pressure becomes important.  We will add spironolactone.  We reviewed side effects.  She will need a potassium  checked in about 2 weeks.  Given her propensity towards hypotension, we will give her a prescription for ProAmatine to be taken 2.5 mg every 4 hours while awake if her systolic blood pressures less than 110  Post polio syndrome, per up to date, is not associated with autonomic insufficiency.  It can be associated with respiratory insufficiency.  I wonder whether a length similar to what is seen in sleep apnea with nocturnal hypoventilation can be associated with hypertension.  Perhaps a sleep study would be in order  A literature search on post polio syndrome and hypertension failed to find any associated links         Sherryl Manges

## 2017-07-04 ENCOUNTER — Telehealth: Payer: Self-pay | Admitting: Internal Medicine

## 2017-07-04 NOTE — Telephone Encounter (Signed)
Per yesterdays AVS: -1) START Spironolactone (aldactone) 25 mg - Take 1 tablet by mouth daily -2) START Proamatine (midodrine) 2.5 mg - Take 1 tablet by mouth every 4 hours while awake if your systolic blood pressure is below 110.  Pt notified she states that she had forgotten expresses thanks for the call

## 2017-07-04 NOTE — Telephone Encounter (Signed)
Per pt call would like a call back about take her new medication.

## 2017-07-09 ENCOUNTER — Telehealth: Payer: Self-pay | Admitting: Internal Medicine

## 2017-07-09 NOTE — Telephone Encounter (Signed)
New message    Pt is calling asking about scheduling for a procedure Dr. Graciela HusbandsKlein told her she needs.

## 2017-07-09 NOTE — Telephone Encounter (Signed)
Spoke with pt and advised that we have to get approval from insurance prior to scheduling her CTA.  Advised I would send message to pre cert/scheduler to see if pt can be updated on where she stands.  Pt appreciative for call.

## 2017-07-12 ENCOUNTER — Other Ambulatory Visit: Payer: Self-pay | Admitting: Internal Medicine

## 2017-07-12 DIAGNOSIS — I10 Essential (primary) hypertension: Secondary | ICD-10-CM

## 2017-07-16 MED ORDER — PREDNISONE 50 MG PO TABS: ORAL_TABLET | ORAL | 0 refills | Status: DC

## 2017-07-16 MED ORDER — METOPROLOL TARTRATE 50 MG PO TABS: ORAL_TABLET | ORAL | 0 refills | Status: DC

## 2017-07-16 MED ORDER — DIPHENHYDRAMINE HCL 50 MG PO TABS: ORAL_TABLET | ORAL | 0 refills | Status: DC

## 2017-07-16 NOTE — Telephone Encounter (Signed)
Spoke with patient to give her instructions for CTA.  She informed me in the past she may have had an allergic reaction to contrast and discussed with PCP Dr. Smith who is agreeable to patient taking prednisone per protocol. I will call in RX to [facility] per pateint request Patient will also need 50 mg Lopressor sent Katrinka Blazingin [facility] as [facility]:   Arrive at [facility] 30-45 min prior to test time.  [facility] [address] [place], [place] [address] [phone]  [facility] (First [facility]).  On the Night Before the Test: . Drink plenty of water. . Do not consume any caffeinated/decaffeinated beverages or chocolate 12 hours prior to your test. . Do not take any antihistamines 12 hours prior to your test. . If the patient has contrast allergy: ? Patient will need a prescription for Prednisone and very clear instructions (as follows): 1. Prednisone 50 mg - take 13 hours prior to test 2. Take another Prednisone 50 mg 7 hours prior to test 3. Take another Prednisone 50 mg 1 hour prior to test 4. Take Benadryl 50 mg 1 hour prior to test . Patient must complete all four doses of above prophylactic medications. . Patient will need a ride after test due to Benadryl.  On the Day of the Test: . Drink plenty of water. Do not drink any water within one hour of the test. . Do not eat any food 4 hours prior to the test. . You may take your regular medications prior to the test. . IF NOT ON A BETA BLOCKER - Take 50 mg of lopressor (metoprolol) one hour before the test. . HOLD Hydrochlorothiazide  morning of the test.  After the Test: . Drink plenty of water. . After receiving IV contrast, you may experience a mild flushed feeling. This is normal. . On occasion, you may experience a mild rash up to 24 hours after the test. This is not dangerous. If this occurs, you can take Benadryl 25  mg and increase your fluid intake. . If you experience trouble breathing, this can be serious. If it is severe call 911 IMMEDIATELY. If it is mild, please call our office. . If you take any of these medications: Glipizide/Metformin, Avandament, Glucavance, please do not take 48 hours after completing test.  She is aware and agreeable

## 2017-07-19 ENCOUNTER — Ambulatory Visit (HOSPITAL_COMMUNITY)
Admission: RE | Admit: 2017-07-19 | Discharge: 2017-07-19 | Disposition: A | Discharge status: Home / Self Care | Payer: Medicare Other | Source: Ambulatory Visit | Attending: Cardiovascular Disease | Admitting: Cardiovascular Disease

## 2017-07-19 DIAGNOSIS — I10 Essential (primary) hypertension: Secondary | ICD-10-CM

## 2017-07-25 ENCOUNTER — Ambulatory Visit: Payer: Medicare Other | Admitting: Physician Assistant

## 2017-07-27 ENCOUNTER — Ambulatory Visit (HOSPITAL_COMMUNITY)
Admission: RE | Admit: 2017-07-27 | Discharge: 2017-07-27 | Disposition: A | Discharge status: Home / Self Care | Payer: Medicare Other | Source: Ambulatory Visit | Attending: Internal Medicine | Admitting: Internal Medicine

## 2017-07-27 ENCOUNTER — Ambulatory Visit (HOSPITAL_COMMUNITY): Payer: Medicare Other

## 2017-07-27 DIAGNOSIS — I10 Essential (primary) hypertension: Secondary | ICD-10-CM | POA: Diagnosis not present

## 2017-07-27 DIAGNOSIS — I700 Atherosclerosis of aorta: Secondary | ICD-10-CM | POA: Insufficient documentation

## 2017-07-27 DIAGNOSIS — I509 Heart failure, unspecified: Secondary | ICD-10-CM | POA: Insufficient documentation

## 2017-07-27 DIAGNOSIS — I110 Hypertensive heart disease with heart failure: Secondary | ICD-10-CM | POA: Insufficient documentation

## 2017-07-27 LAB — POCT I-STAT CREATININE: Creatinine, Ser: 0.6 mg/dL (ref 0.44–1.00)

## 2017-07-27 MED ORDER — NITROGLYCERIN 0.4 MG SL SUBL
SUBLINGUAL_TABLET | SUBLINGUAL | Status: AC
  Filled 2017-07-27: qty 2

## 2017-07-27 MED ORDER — NITROGLYCERIN 0.4 MG SL SUBL
0.8000 mg | SUBLINGUAL_TABLET | Freq: Once | SUBLINGUAL | Status: AC
  Administered 2017-07-27: 0.8 mg via SUBLINGUAL

## 2017-07-27 MED ORDER — METOPROLOL TARTRATE 5 MG/5ML IV SOLN: 5.0000 mg | INTRAVENOUS | Status: DC | PRN

## 2017-07-27 MED ORDER — IOPAMIDOL (ISOVUE-370) INJECTION 76%
INTRAVENOUS | Status: AC
  Administered 2017-07-27: 100 mL
  Filled 2017-07-27: qty 100

## 2017-07-27 MED ORDER — METOPROLOL TARTRATE 5 MG/5ML IV SOLN
INTRAVENOUS | Status: AC
  Administered 2017-07-27: 5 mg
  Filled 2017-07-27: qty 5

## 2017-07-30 ENCOUNTER — Encounter: Payer: Self-pay | Admitting: Internal Medicine

## 2017-07-30 ENCOUNTER — Ambulatory Visit: Payer: Medicare Other | Admitting: Internal Medicine

## 2017-07-30 VITALS — BP 172/70 | HR 73 | Ht 62.0 in | Wt 143.0 lb

## 2017-07-30 DIAGNOSIS — I10 Essential (primary) hypertension: Secondary | ICD-10-CM

## 2017-07-30 MED ORDER — LOSARTAN POTASSIUM 100 MG PO TABS: 100.0000 mg | ORAL_TABLET | Freq: Every day | ORAL | 3 refills | Status: DC

## 2017-07-30 NOTE — Progress Notes (Signed)
Patient Care Team: Merri BrunetteSmith, Candace, MD as PCP - General (Family Medicine)   HPI  Erica Davidson is a 76 y.o. 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     Echocardiogram from 2017 was reviewed.  Left ventricular wall thickness was about 11 mm; left atrial size was normal. 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 Arthritis   . Blood transfusion without reported diagnosis   . Cataract   . Colitis   . Depression   . Diverticulosis   . Encounter for Zostavax administration 02/10/2008  . Fibromyalgia   . GERD (gastroesophageal reflux disease)   . Hemorrhoids, internal   . Hypertension   . Immunization, tetanus-diphtheria 10/2002  . Neuromuscular disorder (HCC)   . Pneumococcal vaccination given 01/2009  . Polio   . Ulcer     Past Surgical History:  Procedure Laterality Date  . ABDOMINAL HYSTERECTOMY    . APPENDECTOMY    . CHOLECYSTECTOMY    . EYE SURGERY      Current Outpatient Medications  Medication Sig Dispense Refill  . amLODipine (NORVASC) 5 MG tablet Take 5 mg by mouth daily.     Marland Kitchen. dexlansoprazole (DEXILANT) 60 MG capsule Take 1 capsule (60 mg total) by mouth daily. 90 capsule 3  . diphenhydrAMINE (BENADRYL) 50 MG tablet Take 1 tablet (50 mg) 1 hour prior to test 1 tablet 0  . hydrochlorothiazide (MICROZIDE) 12.5 MG capsule Take 12.5 mg by mouth daily.     Marland Kitchen. levothyroxine (SYNTHROID, LEVOTHROID) 125 MCG tablet Take 125 mcg by mouth daily.     Marland Kitchen.  losartan (COZAAR) 50 MG tablet Take 50 mg by mouth daily.    . metoprolol tartrate (LOPRESSOR) 50 MG tablet Take 1 tablet (50 mg) by mouth 1 hour prior to test 1 tablet 0  . midodrine (PROAMATINE) 2.5 MG tablet Take 1 tablet by mouth every 4 hours while awake if your systolic blood pressure is below 110 90 tablet 1  . predniSONE (DELTASONE) 50 MG tablet Take 1 tablet (50 mg) 13 hours prior to test. Take 1 tablet (50 mg) 7 hours prior to test. Take 1 tablet (50 mg) 1 hour prior to test 3 tablet 0  . sertraline (ZOLOFT) 50 MG tablet Take 50 mg by mouth daily.    Marland Kitchen. SHINGRIX injection ADM 0.5ML IM UTD  0  . spironolactone (ALDACTONE) 25 MG tablet Take 1 tablet (25 mg total) by mouth daily. 90 tablet 3  . traMADol (ULTRAM) 50 MG tablet as needed.      No current facility-administered medications for this visit.     Allergies  Allergen Reactions  . Zithromax [Azithromycin] Shortness Of Breath  . Atrovent [Ipratropium] Other (See Comments)    Dizziness; confusion.  . Ciprofloxacin     REACTION: rash/itch  . Loratadine-Pseudoephedrine Er     REACTION: tachycardia  . Penicillins   . Prednisone  REACTION: high dose taper  . Codeine Palpitations    Rapid heart rate      Review of Systems negative except from HPI and PMH  Physical Exam BP (!) 172/70   Pulse 73   Ht 5\' 2"  (1.575 m)   Wt 143 lb (64.9 kg)   SpO2 99%   BMI 26.16 kg/m  Well developed and well nourished in no acute distress HENT normal E scleral and icterus clear Neck Supple JVP flat; carotids brisk and full Clear to ausculation Regular rate and rhythm, 2/6 m Soft with active bowel sounds No clubbing cyanosis  Edema Alert and oriented, grossly normal motor and sensory function Skin Warm and Dry  Assessment and  Plan  Accelerated hypertension  labile  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  Post polio syndrome  BP is improved but still with am surges  Will have her change cozaar from am to pm  and increase thedose The renal vasuclar studies were unreveraling  With the aburpt onset of HTN I still worry about a renovascular issue and have suggested she be referred to a tertiary care BP clinic for evaluation  If nothing is found I would consider clonidine as my next med of choice  She will followup with Dr Katrinka BlazingSmith  And we will see her prn  We spent more than 50% of our >25 min visit in face to face counseling regarding the above       Current medicines are reviewed at length with the patient today .  The patient does not  have concerns regarding medicines.

## 2017-07-30 NOTE — Patient Instructions (Addendum)
Your physician has recommended you make the following change in your medication:  1.) increase losartan (Cozaar) to 100 mg once daily--take this at night.  Your physician recommends that you return for lab work today (BMET).  Your physician recommends that you schedule a follow-up as needed.

## 2017-07-31 ENCOUNTER — Encounter: Payer: Self-pay | Admitting: Internal Medicine

## 2017-07-31 LAB — BASIC METABOLIC PANEL
BUN / CREAT RATIO: 22 (ref 12–28)
BUN: 17 mg/dL (ref 8–27)
CO2: 28 mmol/L (ref 20–29)
CREATININE: 0.78 mg/dL (ref 0.57–1.00)
Calcium: 9.3 mg/dL (ref 8.7–10.3)
Chloride: 100 mmol/L (ref 96–106)
GFR calc Af Amer: 87 mL/min/{1.73_m2} (ref >59)
GFR, EST NON AFRICAN AMERICAN: 75 mL/min/{1.73_m2} (ref >59)
Glucose: 89 mg/dL (ref 65–99)
POTASSIUM: 3.7 mmol/L (ref 3.5–5.2)
SODIUM: 142 mmol/L (ref 134–144)

## 2017-08-03 ENCOUNTER — Ambulatory Visit: Payer: Medicare Other | Admitting: Internal Medicine

## 2017-08-07 ENCOUNTER — Telehealth: Payer: Self-pay

## 2017-08-07 NOTE — Telephone Encounter (Signed)
Pt it aware and agreeable to normal results  

## 2017-09-14 ENCOUNTER — Encounter: Payer: Self-pay | Admitting: Neurology

## 2017-09-14 ENCOUNTER — Ambulatory Visit: Payer: Medicare Other | Admitting: Neurology

## 2017-09-14 VITALS — BP 140/90 | HR 82 | Ht 62.0 in | Wt 143.5 lb

## 2017-09-14 DIAGNOSIS — G14 Postpolio syndrome: Secondary | ICD-10-CM

## 2017-09-14 NOTE — Progress Notes (Signed)
[facility] Clinic Note - Initial Visit   Date: 09/14/17  Erica Davidson MRN: [id number] DOB: November 11, 1940   Dear Dr. Katrinka BlazingSmith:  Thank you for your kind referral of Erica Davidson for consultation of post-polio syndrome. Although her history is well known to you, please allow us to reiterate it for the purpose of our medical record. The patient was accompanied to the clinic by self.    History of Present Illness: Erica Davidson is a 77 y.o. 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 She was paralyzed in the legs for about 1 year but with therapy she was able to resume walking.  She fully recovered from her weakness and was doing well until her mid 2550s, when she developed new weakness of the arms and legs.  She saw a specialist at a post-polio clinic in [place] who diagnosed her with post-polio syndrome. She took early retirement at the age of 77 because of her weakness and pain.  She was seen by Dr. Sandria ManlyLove until he retired and then was under the care of Dr. Peggye LeyAther most recently.  She is here because of concern of spells of what she calls "crisis" where she has generalized pain and weakness, which usually lasts several days to a week. She does not complain of joint pain, but moreso achiness of the muscles.  She feels that this is due to post-polio syndrome, where as her PCP feels this is most consistent with fibromylagia.  She has a diagnosis of fibromyalgia and has not tried Lyrica or Cymbalta.  She is referred for further evaluation.   Past Medical History:  Diagnosis Date  . Arthritis   . Blood transfusion without reported diagnosis   . Cataract   . Colitis   . Depression   . Diverticulosis   . Encounter for Zostavax administration  02/10/2008  . Fibromyalgia   . GERD (gastroesophageal reflux disease)   . Hemorrhoids, internal   . Hypertension   . Immunization, tetanus-diphtheria 10/2002  . Neuromuscular disorder (HCC)   . Pneumococcal vaccination given 01/2009  . Polio   . Ulcer     Past Surgical History:  Procedure Laterality Date  . ABDOMINAL HYSTERECTOMY    . APPENDECTOMY    . CHOLECYSTECTOMY    . EYE SURGERY       Medications:  Outpatient Encounter Medications as of 09/14/2017  Medication Sig Note  . amLODipine (NORVASC) 5 MG tablet Take 5 mg by mouth daily.  10/28/2015: Received from: External Pharmacy  . dexlansoprazole (DEXILANT) 60 MG capsule Take 1 capsule (60 mg total) by mouth daily.   . diphenhydrAMINE (BENADRYL) 50 MG tablet Take 1 tablet (50 mg) 1 hour prior to test   . hydrochlorothiazide (MICROZIDE) 12.5 MG capsule Take 12.5 mg by mouth daily.  04/28/2015: Received from: External Pharmacy  . levothyroxine (SYNTHROID, LEVOTHROID) 125 MCG tablet Take 125 mcg by mouth daily.  01/04/2016: Received from: External Pharmacy  . losartan (COZAAR) 100 MG tablet Take 1 tablet (100 mg total) by mouth daily. Take at night   . metoprolol tartrate (LOPRESSOR) 50 MG tablet Take 1 tablet (50 mg) by mouth 1 hour prior to test   . midodrine (PROAMATINE) 2.5 MG tablet Take 1 tablet by mouth every 4 hours while  awake if your systolic blood pressure is below 110   . predniSONE (DELTASONE) 50 MG tablet Take 1 tablet (50 mg) 13 hours prior to test. Take 1 tablet (50 mg) 7 hours prior to test. Take 1 tablet (50 mg) 1 hour prior to test   . sertraline (ZOLOFT) 50 MG tablet Take 50 mg by mouth daily.   Marland Kitchen SHINGRIX injection ADM 0.5ML IM UTD   . spironolactone (ALDACTONE) 25 MG tablet Take 1 tablet (25 mg total) by mouth daily.   . traMADol (ULTRAM) 50 MG tablet as needed.  04/28/2015: Received from: External Pharmacy   No facility-administered encounter medications on file as of 09/14/2017.      Allergies:  Allergies    Allergen Reactions  . Zithromax [Azithromycin] Shortness Of Breath  . Atrovent [Ipratropium] Other (See Comments)    Dizziness; confusion.  . Ciprofloxacin     REACTION: rash/itch  . Loratadine-Pseudoephedrine Er     REACTION: tachycardia  . Penicillins   . Prednisone     REACTION: high dose taper  . Codeine Palpitations    Rapid heart rate    Family History: Family History  Problem Relation Age of Onset  . Rheum arthritis Mother   . Liver disease Father   . Cancer Sister        Lung    Social History: Social History   Tobacco Use  . Smoking status: Never Smoker  . Smokeless tobacco: Never Used  Substance Use Topics  . Alcohol use: No  . Drug use: No   Social History   Social History Narrative   Pt lives at home with spouse in a one story home.  Has one daughter.  Retired Runner, broadcasting/film/video.  Education: college.    Caffeine Use: quit 09/2012   Patient is right handed    Review of Systems:  CONSTITUTIONAL: No fevers, chills, night sweats, or weight loss.   EYES: No visual changes or eye pain ENT: No hearing changes.  No history of nose bleeds.   RESPIRATORY: No cough, wheezing and shortness of breath.   CARDIOVASCULAR: Negative for chest pain, and palpitations.   GI: Negative for abdominal discomfort, blood in stools or black stools.  No recent change in bowel habits.   GU:  No history of incontinence.   MUSCLOSKELETAL: No history of joint pain or swelling.  +myalgias.   SKIN: Negative for lesions, rash, and itching.   HEMATOLOGY/ONCOLOGY: Negative for prolonged bleeding, bruising easily, and swollen nodes.  No history of cancer.   ENDOCRINE: Negative for cold or heat intolerance, polydipsia or goiter.   PSYCH:  No depression or anxiety symptoms.   NEURO: As Above.   Vital Signs:  BP 140/90   Pulse 82   Ht 5\' 2"  (1.575 m)   Wt 143 lb 8 oz (65.1 kg)   SpO2 97%   BMI 26.25 kg/m    General Medical Exam:   General:  Well appearing, comfortable.   Eyes/ENT: see  cranial nerve examination.   Neck: No masses appreciated.  Full range of motion without tenderness.  No carotid bruits. Respiratory:  Clear to auscultation, good air entry bilaterally.   Cardiac:  Regular rate and rhythm, no murmur.   Extremities:  No deformities, edema, or skin discoloration.  Skin:  No rashes or lesions.  Neurological Exam: MENTAL STATUS including orientation to time, place, person, recent and remote memory, attention span and concentration, language, and fund of knowledge is normal.  Speech is not dysarthric.  CRANIAL NERVES: II:  No visual field defects.  Unremarkable fundi.   III-IV-VI: Pupils equal round and reactive to light.  Normal conjugate, extra-ocular eye movements in all directions of gaze.  No nystagmus.  No ptosis.   V:  Normal facial sensation.    VII:  Normal facial symmetry and movements.  VIII:  Normal hearing and vestibular function.   IX-X:  Normal palatal movement.   XI:  Normal shoulder shrug and head rotation.   XII:  Normal tongue strength and range of motion, no deviation or fasciculation.  MOTOR:  No atrophy, fasciculations or abnormal movements.  No pronator drift.  Tone is normal.    Right Upper Extremity:    Left Upper Extremity:    Deltoid  5/5   Deltoid  5/5   Biceps  5/5   Biceps  5/5   Triceps  5/5   Triceps  5/5   Wrist extensors  5/5   Wrist extensors  5/5   Wrist flexors  5/5   Wrist flexors  5/5   Finger extensors  5/5   Finger extensors  5/5   Finger flexors  5/5   Finger flexors  5/5   Dorsal interossei  5/5   Dorsal interossei  5/5   Abductor pollicis  5/5   Abductor pollicis  5/5   Tone (Ashworth scale)  0  Tone (Ashworth scale)  0   Right Lower Extremity:    Left Lower Extremity:    Hip flexors  5/5   Hip flexors  5/5   Hip extensors  5/5   Hip extensors  5/5   Knee flexors  5/5   Knee flexors  5/5   Knee extensors  5/5   Knee extensors  5/5   Dorsiflexors  5/5   Dorsiflexors  5/5   Plantarflexors  5/5    Plantarflexors  5/5   Toe extensors  5/5   Toe extensors  5/5   Toe flexors  5/5   Toe flexors  5/5   Tone (Ashworth scale)  0  Tone (Ashworth scale)  0   MSRs:  Right                                                                 Left brachioradialis 2+  brachioradialis 2+  biceps 2+  biceps 2+  triceps 2+  triceps 2+  patellar 2+  patellar 2+  ankle jerk 1+  ankle jerk 1+  Hoffman no  Hoffman no  plantar response down  plantar response down   SENSORY:  Normal and symmetric perception of light touch, pinprick, vibration, and proprioception.   COORDINATION/GAIT: Normal finger-to- nose-finger.  Intact rapid alternating movements bilaterally.  Able to rise from a chair without using arms.  Gait narrow based, mild unsteadiness, unassisted.  She is able to stand on heels and toes.  There is unsteadiness with tandem gait.   IMPRESSION: Mrs. 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  Her exam is remarkably intact for someone with post-polio syndrome.  I explained that patients who developed pain with post-polio syndrome is due to degenerative skeletal deformity and degeneration, rather than the disease itself and it would not manifest with such widespread and diffuse pain.  Her current pain crisis  that she experiences is most consistent with fibromyalgia. Recommended seeking treatment for underlying fibromyalgia and/or start physical therapy.  I encouraged her to stay active and try to start water exercises, if able.   She also requested if I would continue to follow her CPAP settings, and informed her that none of our providers manage sleep disorders and she would need to see a sleep specialist for this.    All questions were answered.  The duration of this appointment visit was 40 minutes of face-to-face time with the patient.  Greater than 50% of this time was spent in counseling, explanation of diagnosis, planning of further  management, and coordination of care.   Thank you for allowing me to participate in patient's care.  If I can answer any additional questions, I would be pleased to do so.    Sincerely,    Donika K. Allena Katz, DO

## 2017-09-14 NOTE — Patient Instructions (Signed)
Return to clinic as needed

## 2017-09-21 ENCOUNTER — Ambulatory Visit: Payer: Medicare Other | Admitting: Neurology

## 2017-11-01 ENCOUNTER — Telehealth: Payer: Self-pay | Admitting: Neurology

## 2017-11-01 NOTE — Telephone Encounter (Signed)
Pt called opthalmologist dx her with dry eyes and said a lot is due to the air with the CPAP. She also has a nasal infection and she feels it maybe due to CPAP. She has not been able to use the machine in 2 weeks. Please call to advise

## 2017-11-01 NOTE — Telephone Encounter (Signed)
I spoke with Dr. Frances FurbishAthar, she recommends that pt come in for an appt for a cpap download. Pt also should be changing her supplies monthly and cleaning her machine daily. Pt may need a pressure reduction.  I called pt, she is agreeable to the appt, and has been cleaning her machine and changing her supplies as directed. Her PCP told her that she had a nasal infection. Pt has not seen an ENT. An appt was made for pt on 11/06/17 at 3:45pm with Eber Jonesarolyn, NP. Pt verbalized understanding of appt date and time and to bring her cpap to this visit for a download.

## 2017-11-06 ENCOUNTER — Ambulatory Visit: Payer: Self-pay | Admitting: Nurse Practitioner

## 2017-12-25 ENCOUNTER — Encounter (HOSPITAL_COMMUNITY): Payer: Self-pay | Admitting: *Deleted

## 2017-12-25 ENCOUNTER — Other Ambulatory Visit: Payer: Self-pay

## 2017-12-25 ENCOUNTER — Emergency Department (HOSPITAL_COMMUNITY)
Admission: EM | Admit: 2017-12-25 | Discharge: 2017-12-25 | Disposition: A | Discharge status: Home / Self Care | Payer: Medicare Other | Attending: Emergency Medicine | Admitting: Emergency Medicine

## 2017-12-25 ENCOUNTER — Emergency Department (HOSPITAL_COMMUNITY): Payer: Medicare Other

## 2017-12-25 DIAGNOSIS — I10 Essential (primary) hypertension: Secondary | ICD-10-CM | POA: Diagnosis not present

## 2017-12-25 DIAGNOSIS — R51 Headache: Secondary | ICD-10-CM | POA: Diagnosis present

## 2017-12-25 DIAGNOSIS — R253 Fasciculation: Secondary | ICD-10-CM

## 2017-12-25 DIAGNOSIS — Z79899 Other long term (current) drug therapy: Secondary | ICD-10-CM | POA: Diagnosis not present

## 2017-12-25 LAB — COMPREHENSIVE METABOLIC PANEL
ALBUMIN: 4.4 g/dL (ref 3.5–5.0)
ALT: 20 U/L (ref 14–54)
ANION GAP: 12 (ref 5–15)
AST: 27 U/L (ref 15–41)
Alkaline Phosphatase: 66 U/L (ref 38–126)
BUN: 19 mg/dL (ref 6–20)
CHLORIDE: 106 mmol/L (ref 101–111)
CO2: 23 mmol/L (ref 22–32)
CREATININE: 0.74 mg/dL (ref 0.44–1.00)
Calcium: 10 mg/dL (ref 8.9–10.3)
GFR calc non Af Amer: >60 mL/min (ref >60)
GLUCOSE: 100 mg/dL — AB (ref 65–99)
Potassium: 2.8 mmol/L — ABNORMAL LOW (ref 3.5–5.1)
SODIUM: 141 mmol/L (ref 135–145)
Total Bilirubin: 0.7 mg/dL (ref 0.3–1.2)
Total Protein: 7.8 g/dL (ref 6.5–8.1)

## 2017-12-25 LAB — TSH: TSH: 0.577 u[IU]/mL (ref 0.350–4.500)

## 2017-12-25 LAB — CBC WITH DIFFERENTIAL/PLATELET
Basophils Absolute: 0 10*3/uL (ref 0.0–0.1)
Basophils Relative: 1 %
Eosinophils Absolute: 0.3 10*3/uL (ref 0.0–0.7)
Eosinophils Relative: 6 %
HEMATOCRIT: 40.3 % (ref 36.0–46.0)
HEMOGLOBIN: 13.3 g/dL (ref 12.0–15.0)
LYMPHS ABS: 1.4 10*3/uL (ref 0.7–4.0)
Lymphocytes Relative: 30 %
MCH: 27.5 pg (ref 26.0–34.0)
MCHC: 33 g/dL (ref 30.0–36.0)
MCV: 83.4 fL (ref 78.0–100.0)
MONOS PCT: 8 %
Monocytes Absolute: 0.4 10*3/uL (ref 0.1–1.0)
NEUTROS ABS: 2.6 10*3/uL (ref 1.7–7.7)
NEUTROS PCT: 55 %
Platelets: 219 10*3/uL (ref 150–400)
RBC: 4.83 MIL/uL (ref 3.87–5.11)
RDW: 13.8 % (ref 11.5–15.5)
WBC: 4.6 10*3/uL (ref 4.0–10.5)

## 2017-12-25 LAB — T4, FREE: Free T4: 1.22 ng/dL — ABNORMAL HIGH (ref 0.61–1.12)

## 2017-12-25 LAB — D-DIMER, QUANTITATIVE: D-Dimer, Quant: 0.51 ug/mL-FEU — ABNORMAL HIGH (ref 0.00–0.50)

## 2017-12-25 LAB — MAGNESIUM: Magnesium: 2.3 mg/dL (ref 1.7–2.4)

## 2017-12-25 LAB — I-STAT TROPONIN, ED: TROPONIN I, POC: 0.01 ng/mL (ref 0.00–0.08)

## 2017-12-25 MED ORDER — POTASSIUM CHLORIDE ER 10 MEQ PO TBCR: 20.0000 meq | EXTENDED_RELEASE_TABLET | Freq: Every day | ORAL | 0 refills | Status: DC

## 2017-12-25 MED ORDER — POTASSIUM CHLORIDE CRYS ER 20 MEQ PO TBCR
40.0000 meq | EXTENDED_RELEASE_TABLET | Freq: Once | ORAL | Status: AC
  Administered 2017-12-25: 40 meq via ORAL
  Filled 2017-12-25: qty 2

## 2017-12-25 MED ORDER — LORAZEPAM 1 MG PO TABS
1.0000 mg | ORAL_TABLET | Freq: Once | ORAL | Status: AC
  Administered 2017-12-25: 1 mg via ORAL
  Filled 2017-12-25: qty 1

## 2017-12-25 MED ORDER — IOPAMIDOL (ISOVUE-370) INJECTION 76%: 100.0000 mL | Freq: Once | INTRAVENOUS | Status: DC | PRN

## 2017-12-25 MED ORDER — IOPAMIDOL (ISOVUE-370) INJECTION 76%
INTRAVENOUS | Status: AC
  Filled 2017-12-25: qty 100

## 2017-12-25 NOTE — ED Notes (Signed)
Walked patient around nursing station patient did well oxygen level went down to 96 but stayed at 98 patient did well

## 2017-12-25 NOTE — ED Notes (Signed)
ED Provider at bedside. 

## 2017-12-25 NOTE — ED Provider Notes (Signed)
[facility]   CSN: [id number] Arrival date & time: 12/25/17  [id number]     History   Chief Complaint Chief Complaint  Patient presents with  . Headache    HPI Jari FavreSylvia K Marano is a 77 y.o. female.  HPI   Ms. Suzie Portelaayne is a 77yo female with a history of post-polio syndrome, fibromyalgia, hypertension, arthritis, cataracts who presents to the emergency department for evaluation of shortness of breath, head feels "blank" and abnormal movements. Patient reports that she feels as if she is more short of breath since last night. States that she has been having shortness of breath of unknown origin for some time now, has had a "full work up" including cardiac causes but has never had a definitive diagnosis. States that she has had a hacking dry cough for many years.  Uses a CPAP machine for sleep apnea, although states that she did not use it last night.  Denies fever, wheezing, chest pain. Denies history of DVT/PE, leg swelling or calf tenderness, exogenous estrogen, recent surgery or immobilization. Reports that she feels as if there's nothing in her head, as if it is "blank." Denies headache. During questioning she is having dysrhythmic movements in her bilateral hands and face. When asked if she has a movement disorder she states that she has occasional jerking in her hands, but the constant movement that she is currently having is worse than her baseline. States that the jerking movements are making it more difficult to lift her leg. She denies numbness, visual disturbance, slurred speech, difficulty swallowing, abdominal pain, nausea/vomiting, diarrhea, dysuria, urinary frequency, lightheadedness, syncope. She denies changes in her medications. Denies being on seizure medicine, denies anti-emetic use, denies new over the counter medications.  Reports that she has been seen by several neurologists, but they do not do anything for her so she has not  followed up or returned.  Patient adds that she hears a "thump thump" noise in her head for several months now.   When discussing her history of polio, patient states that she was diagnosed at 77 years of age and was on an iron lung machine.  She was paralyzed for over a year and her bilateral lower extremities.  She was also diagnosed with Lyme disease 2 years ago for which she was treated.  Past Medical History:  Diagnosis Date  . Arthritis   . Blood transfusion without reported diagnosis   . Cataract   . Colitis   . Depression   . Diverticulosis   . Encounter for Zostavax administration 02/10/2008  . Fibromyalgia   . GERD (gastroesophageal reflux disease)   . Hemorrhoids, internal   . Hypertension   . Immunization, tetanus-diphtheria 10/2002  . Neuromuscular disorder (HCC)   . Pneumococcal vaccination given 01/2009  . Polio   . Ulcer     Patient Active Problem List   Diagnosis Date Noted  . POST-POLIO SYNDROME 10/07/2009  . OBSTRUCTIVE SLEEP APNEA 10/07/2009  . Essential hypertension 10/07/2009  . G E R D 10/07/2009  . OSTEOPOROSIS 10/07/2009  . COUGH 10/07/2009    Past Surgical History:  Procedure Laterality Date  . ABDOMINAL HYSTERECTOMY    . APPENDECTOMY    . CHOLECYSTECTOMY    . EYE SURGERY       OB History   None      Home Medications    Prior to Admission medications   Medication Sig Start Date End Date Taking? Authorizing Provider  amLODipine (NORVASC) 5  MG tablet Take 5 mg by mouth daily.  10/18/15   [provider]  dexlansoprazole (DEXILANT) 60 MG capsule Take 1 capsule (60 mg total) by mouth daily. 01/04/16   Kozlow, Alvira Philips, MD  diphenhydrAMINE (BENADRYL) 50 MG tablet Take 1 tablet (50 mg) 1 hour prior to test 07/16/17   Duke Salvia, MD  hydrochlorothiazide (MICROZIDE) 12.5 MG capsule Take 12.5 mg by mouth daily.  03/22/15   [provider]  levothyroxine (SYNTHROID, LEVOTHROID) 125 MCG tablet Take 125 mcg by mouth daily.  11/16/15    [provider]  losartan (COZAAR) 100 MG tablet Take 1 tablet (100 mg total) by mouth daily. Take at night 07/30/17 10/28/17  Duke Salvia, MD  metoprolol tartrate (LOPRESSOR) 50 MG tablet Take 1 tablet (50 mg) by mouth 1 hour prior to test 07/16/17   Duke Salvia, MD  midodrine (PROAMATINE) 2.5 MG tablet Take 1 tablet by mouth every 4 hours while awake if your systolic blood pressure is below 110 07/03/17   Duke Salvia, MD  predniSONE (DELTASONE) 50 MG tablet Take 1 tablet (50 mg) 13 hours prior to test. Take 1 tablet (50 mg) 7 hours prior to test. Take 1 tablet (50 mg) 1 hour prior to test 07/16/17   Duke Salvia, MD  sertraline (ZOLOFT) 50 MG tablet Take 50 mg by mouth daily.    [provider]  [facility] injection ADM 0.5ML IM UTD 06/07/17   [provider]  spironolactone (ALDACTONE) 25 MG tablet Take 1 tablet (25 mg total) by mouth daily. 07/03/17 10/01/17  Duke Salvia, MD  traMADol Janean Sark) 50 MG tablet as needed.  03/22/15   [provider]    Family History Family History  Problem Relation Age of Onset  . Rheum arthritis Mother   . Liver disease Father   . Cancer Sister        Lung    Social History Social History   Tobacco Use  . Smoking status: Never Smoker  . Smokeless tobacco: Never Used  Substance Use Topics  . Alcohol use: No  . Drug use: No     Allergies   Zithromax [azithromycin]; Atrovent [ipratropium]; Ciprofloxacin; Loratadine-pseudoephedrine er; Penicillins; Prednisone; and Codeine   Review of Systems Review of Systems  Constitutional: Negative for chills and fever.  HENT: Negative for congestion, ear pain, rhinorrhea and sore throat.   Eyes: Negative for visual disturbance.  Respiratory: Positive for shortness of breath. Negative for cough and wheezing.   Cardiovascular: Negative for chest pain and leg swelling.  Gastrointestinal: Negative for abdominal pain, blood in stool, diarrhea, nausea and  vomiting.  Genitourinary: Negative for difficulty urinating, dysuria, flank pain and frequency.  Skin: Negative for rash.  Neurological: Negative for light-headedness, numbness and headaches (head feels "blank").  Psychiatric/Behavioral: The patient is not nervous/anxious.      Physical Exam Updated Vital Signs BP (!) 158/116   Pulse 82   Temp 98 F (36.7 C)   Resp (!) 29   Ht 5\' 3"  (1.6 m)   Wt 62.6 kg (138 lb)   SpO2 100%   BMI 24.45 kg/m   Physical Exam  Constitutional: She appears well-developed and well-nourished. No distress.  HENT:  Head: Normocephalic and atraumatic.  Mouth/Throat: Oropharynx is clear and moist. No oropharyngeal exudate.  Eyes: Pupils are equal, round, and reactive to light. Conjunctivae and EOM are normal. Right eye exhibits no discharge. Left eye exhibits no discharge.  Neck: Normal range of  motion. Neck supple.  Cardiovascular: Normal rate, regular rhythm and intact distal pulses. Exam reveals no friction rub.  No murmur heard. Pulmonary/Chest: Effort normal and breath sounds normal. No stridor. No respiratory distress. She has no wheezes. She has no rales.  Abdominal: Soft. Bowel sounds are normal.  Musculoskeletal:  No leg swelling.  Neurological: She is alert. Coordination normal.  Dysrhythmic movements in the face and bilateral arms. Occasional jerking of the bilateral legs. Seems to be distractible and stops during cranial nerve testing. Speech fluent, no aphasia.  Cranial Nerves:  II:  Peripheral visual fields grossly normal, pupils equal, round, reactive to light III,IV, VI: ptosis not present, extra-ocular motions intact bilaterally  V,VII: smile symmetric, facial light touch sensation equal VIII: hearing grossly normal to voice  X: uvula elevates symmetrically  XI: bilateral shoulder shrug symmetric and strong XII: midline tongue extension without fassiculations Motor:  Normal tone. 5/5 in upper extremities including strong and equal  grip strength. 4/5 in bilateral lower extremities  Including dorsiflexion/plantar flexion Sensory: Pinprick and light touch normal in all extremities.  Cerebellar: slowed finger-to-nose with bilateral upper extremities, no dysmetria  Skin: She is not diaphoretic.  Psychiatric: She has a normal mood and affect. Her behavior is normal.  Nursing note and vitals reviewed.    ED Treatments / Results  Labs (all labs ordered are listed, but only abnormal results are displayed) Labs Reviewed  COMPREHENSIVE METABOLIC PANEL - Abnormal; Notable for the following components:      Result Value   Potassium 2.8 (*)    Glucose, Bld 100 (*)    All other components within normal limits  D-DIMER, QUANTITATIVE (NOT AT [facility]) - Abnormal; Notable for the following components:   D-Dimer, Quant 0.51 (*)    All other components within normal limits  T4, FREE - Abnormal; Notable for the following components:   Free T4 1.22 (*)    All other components within normal limits  CBC WITH DIFFERENTIAL/PLATELET  MAGNESIUM  TSH  B. BURGDORFI ANTIBODIES  I-STAT TROPONIN, ED    EKG EKG Interpretation  Date/Time:  Tuesday December 25 2017 06:43:17 EDT Ventricular Rate:  68 PR Interval:    QRS Duration: 103 QT Interval:  453 QTC Calculation: 482 R Axis:   66 Text Interpretation:  Sinus rhythm Ventricular premature complex Minimal ST depression, diffuse leads Confirmed by Devoria Albe ([id number]) on 12/25/2017 6:54:45 AM Also confirmed by Devoria Albe ([id number]), editor Elita Quick ([id number])  on 12/25/2017 7:12:07 AM   Radiology Ct Head Wo Contrast  Result Date: 12/25/2017 CLINICAL DATA:  Transient altered mental status EXAM: CT HEAD WITHOUT CONTRAST TECHNIQUE: Contiguous axial images were obtained from the base of the skull through the vertex without intravenous contrast. COMPARISON:  July 07, 2009 FINDINGS: Brain: The ventricles are normal in size and configuration. There is mild frontal atrophy bilaterally. There  is no intracranial mass, hemorrhage, extra-axial fluid collection, or midline shift. Gray-white compartments appear normal. No acute infarct evident. Vascular: No hyperdense vessels. There is mild calcification in each carotid siphon. Skull: Bony calvarium appears intact. Sinuses/Orbits: There is mucosal thickening in several ethmoid air cells. Other visualized paranasal sinuses are clear. Visualized orbits appear symmetric bilaterally. Apparent cataract removals bilaterally. Other: Mastoids on the right are clear. There is opacification in several inferior mastoids on the left. IMPRESSION: Mild frontal atrophy. No intracranial mass or hemorrhage. Gray-white compartments appear normal. There are foci of arterial vascular calcification. There is mucosal thickening in several ethmoid air cells. There is mild inferior  left-sided mastoid disease. Electronically Signed   By: Bretta Bang III M.D.   On: 12/25/2017 08:10    Procedures Procedures (including critical care time)  Medications Ordered in ED Medications  LORazepam (ATIVAN) tablet 1 mg (1 mg Oral Given 12/25/17 0736)  potassium chloride SA (K-DUR,KLOR-CON) CR tablet 40 mEq (40 mEq Oral Given 12/25/17 0840)     Initial Impression / Assessment and Plan / ED Course  I have reviewed the triage vital signs and the nursing notes.  Pertinent labs & imaging results that were available during my care of the patient were reviewed by me and considered in my medical decision making (see chart for details).     Patient with a history of post polio syndrome presents to the emergency department for evaluation of jerking movements, shortness of breath and had a feeling blank.  She has a history of abnormal jerking movements, but states that this is worse than her baseline.  On exam she has dysrhythmic movements in her bilateral arms and face.  This seems to be distractible with cranial nerve testing.  She has no focal neurological deficits.  Lungs clear to  auscultation. Plan to get labs for further evaluation and head CT given change in patient's underlying movement disorder.  Labs reviewed.  Of note she is hypokalemic with potassium of 2.8.  Magnesium WNL.  Hypokalemia is likely contributing to the exacerbation of her underlying movement disorder.  40 mEq p.o. potassium ordered.  No other major electrolyte abnormalities.  CBC within normal limits.  TSH within normal limits.  Patient's presentation highly atypical for ACS, although did get troponin and EKG for further evaluation.  Do not suspect ACS given negative troponin and EKG nonischemic.  Did get d-dimer given patient reporting shortness of breath, with lungs clear to auscultation and no other clear explanation.  D-dimer is mildly elevated at 0.51 which is above normal (0-0.5).  Discussed this results with Dr. Lynelle Doctor who also saw the patient and states that normal corrected d-dimer for patient's age would be 0.7, thus she is actually d-dimer negative and does not need CT angio of the chest.  No signs of fluid overload, do not suspect CHF contributing.  Her shortness of breath feeling overnight may have been related to the fact that she did not wear her CPAP machine last night.  CT scan of the head without acute abnormality, it does show mild frontal atrophy.    About 30 minutes after my initial evaluation of the patient, I am called back into the room by family members.  Patient is stating that her symptoms have completely resolved.  She is no longer having abnormal dysrhythmic jerking movements.  States that she is breathing at baseline.  Again, discussed this patient with Dr. Lynelle Doctor who also saw the patient and recommends neurology consult.  Spoke with neurologist Dr. Laurence Slate who states that patient can be discharged and followed up as an outpatient with neurology. He agrees that hypokalemia likely contributed to her exacerbation of her underlying movement disorder.  He also states that it could be related  to hypercarbia in the setting of patient not wearing her CPAP machine overnight.   Discussed results with patient at bedside.  Counseled her that she should follow-up with neurology, particularly a neurologist who specializes in movement disorders.  Have given her information in her discharge paperwork to do so.  Plan to discharge her with p.o. potassium and have counseled her on choosing foods high in potassium.  Have counseled her  that she will need to follow-up with her PCP to have her potassium rechecked in a week.  Her blood pressure was elevated in the ER today, counseled her to have this rechecked and managed by her primary care doctor as this is been an ongoing issue for her.  Family members at bedside had many questions, and I answered them to the best of my ability. Counseled her and family members at bedside on return precautions and they agree.  Final Clinical Impressions(s) / ED Diagnoses   Final diagnoses:  Muscle twitching    ED Discharge Orders        Ordered    potassium chloride (K-DUR) 10 MEQ tablet  Daily     12/25/17 0934       Kellie Shropshire, PA-C 12/25/17 1811    Devoria Albe, MD 12/25/17 [phone]

## 2017-12-25 NOTE — Discharge Instructions (Addendum)
Your potassium was low today, which is likely related to your symptoms. Please start taking potassium daily and have your potassium rechecked by your regular doctor in one week for further medication management and monitoring.   Please also follow up with neurology regarding your ER visit today. When you call, ask to set up appointment with movement specialist. I have listed information to schedule an appointment.   Return to the ER if you have any new or worsening symptoms like SOB, chest pain, worsening headache.

## 2017-12-25 NOTE — ED Triage Notes (Addendum)
C/o sob all day off and on , tonight fell this "blank " feeling in her head, states she cant think

## 2017-12-25 NOTE — ED Provider Notes (Signed)
Patient presents to the emergency department with her husband son-in-law.  She states she had some shortness of breath last night and states when she laid down to go to bed she felt like her "head closed off" in she "felt blank in her head".  She has had a dry hacking cough for a long time.  She states maybe her heart was racing.  She has been having episodes of shortness of breath with unknown etiology.  She also has a underlying tremor but they state when she got to the ED it got much more intense and she was twitching and jerking and she states "my arm was flopping".  She also states she hears a sound in her head for the past week that is a "bump bump, bump bump" and the way she does not makes it sound like a heartbeat.  She states she has had her family looking in the attic in the basement for the sounds.  I asked her if she never put her hand on her neck to check her pulse when this happened and she states she cannot feel her pulse.  Patient had a history of polio when she was 5 and was on a iron long machine.  She was paralyzed for several years.  She has seen several neurologist.  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  She states her doctor has been investigating her shortness of breath and she has had a cardiac evaluation before.  She states she wears a CPAP at night.  Patient states she was diagnosed with Lyme disease a year ago when she was treated for 2 weeks.  At that time she happened to have a doctor's appointment and had the rash.  She is on certain by the other symptoms she had with it.  During her exam patient is noted to have some blinking of her eyes and some twitching of the chest muscles they are not rhythmic.  They are sporadic.  She is able to raise either leg up off the stretcher although she states earlier she could not lift her left leg up.  Medical screening examination/treatment/procedure(s) were conducted as a shared visit with non-physician practitioner(s) and  myself.  I personally evaluated the patient during the encounter.  EKG Interpretation  Date/Time:  Tuesday December 25 2017 06:43:17 EDT Ventricular Rate:  68 PR Interval:    QRS Duration: 103 QT Interval:  453 QTC Calculation: 482 R Axis:   66 Text Interpretation:  Sinus rhythm Ventricular premature complex Minimal ST depression, diffuse leads Confirmed by Devoria AlbeKnapp, Kinsey Cowsert ([id number]) on 12/25/2017 6:54:45 AM Also confirmed by Devoria AlbeKnapp, Birtha Hatler ([id number]), editor Elita QuickWatlington, Beverly ([id number])  on 12/25/2017 7:12:07 AM   Devoria AlbeIva Alexy Heldt, MD, Concha PyoFACEP    Eagle Pitta, MD 12/25/17 [phone]

## 2017-12-26 LAB — B. BURGDORFI ANTIBODIES: B burgdorferi Ab IgG+IgM: <0.91 {ISR} (ref 0.00–0.90)

## 2018-09-10 DIAGNOSIS — M25512 Pain in left shoulder: Secondary | ICD-10-CM | POA: Insufficient documentation

## 2018-12-16 ENCOUNTER — Telehealth: Payer: Self-pay

## 2018-12-16 NOTE — Telephone Encounter (Signed)
Received a request for an order for cpap supplies from [facility]. Pt has not been seen in over one year.  I called pt and explained this to her, and offered her a virtual visit with Dr. Frances Furbish. Due to current COVID 19 pandemic, our office is severely reducing in office visits for at least the next 2 weeks, in order to minimize the risk to our patients and healthcare providers.   I explained how to conduct a virtual visit. Pt says that she will call us back later to schedule this appt.

## 2018-12-17 ENCOUNTER — Ambulatory Visit (INDEPENDENT_AMBULATORY_CARE_PROVIDER_SITE_OTHER): Payer: Medicare Other | Admitting: Family Medicine

## 2018-12-17 ENCOUNTER — Other Ambulatory Visit: Payer: Self-pay

## 2018-12-17 ENCOUNTER — Encounter: Payer: Self-pay | Admitting: Family Medicine

## 2018-12-17 DIAGNOSIS — G4733 Obstructive sleep apnea (adult) (pediatric): Secondary | ICD-10-CM

## 2018-12-17 NOTE — Telephone Encounter (Signed)
Pt returned my call. She is agreeable to a virtual visit with Amy, NP today at 3:00pm.  Pt understands that although there may be some limitations with this type of visit, we will take all precautions to reduce any security or privacy concerns.  Pt understands that this will be treated like an in office visit and we will file with pt's insurance, and there may be a patient responsible charge related to this service.  Pt's email is [email or web address]. Pt understands that the [employer] webex software must be downloaded and operational on the device pt plans to use for the visit.  Pt's meds, allergies, and PMH were updated.  Pt reports that her cpap is going well. She needs cpap supplies and needs a yearly visit.

## 2018-12-17 NOTE — Addendum Note (Signed)
Addended by: Geronimo Running A on: 12/17/2018 02:30 PM   Modules accepted: Orders

## 2018-12-17 NOTE — Progress Notes (Addendum)
PATIENT: Erica Davidson DOB: March 06, 1941  REASON FOR VISIT: follow up HISTORY FROM: patient  Virtual Visit via Telephone Note  I connected with Erica Davidson on 12/17/18 at  3:00 PM EDT by telephone and verified that I am speaking with the correct person using two identifiers.   I discussed the limitations, risks, security and privacy concerns of performing an evaluation and management service by telephone and the availability of in person appointments. I also discussed with the patient that there may be a patient responsible charge related to this service. The patient expressed understanding and agreed to proceed.   History of Present Illness:  12/17/18 Erica Davidson is a 78 y.o. female for follow up of sleep apnea on CPAP.  She reports doing well on CPAP therapy.  Unfortunately, due to virtual environment we are able to download the CPAP report.  Patient reports daily compliance with therapy.  She is able to report information from her CPAP machine that states that over the last 30 days she has used her machine greater than 4 hours 25 out of 30 days.  She states that her machine reports excellent sleep quality.  She is unable to locate any other information for today's visit.  She feels that she is doing well and sleeping well with CPAP.  She is requesting supplies today.   History (copied from Dr Guadelupe Sabin note on 04/26/2017)  Erica Davidson is a very pleasant 78 year old right handed woman with an underlying medical history of post polio syndrome with history of polio at age 85, hypothyroidism, hypertension, hyperlipidemia, fibromyalgia, tremor, neck pain and right knee pain and allergies, who presents for followup consultation of her OSA, and RLS with PLMs. She is unaccompanied today. I last saw her on 10/28/2015, at which time she was compliant with her CPAP. She reported a recent upper respiratory infection for which she was treated with nebulizer, and antibiotics. She was not able to use her  CPAP during a period of about 6 weeks. She needed new CPAP supplies.  Today, 04/26/2017: I could not review her CPAP compliance data d/t no card available. She is okay with bringing by the card at some point when she is in this area. She reports increase in fatigue and sleepiness. She does not sleep very well at night. She does take a nap typically on a day-to-day basis. She has had forgetfulness. She has noticed some increase in her blood pressure values recently in the past month. She typically monitors this at home.  Previously:  I saw her on 04/28/2015, at which time she reported problems with her neck. She had seen a rheumatologist, Dr. Amil Amen. She had some x-rays. He agreed that she may have fibromyalgia. She had seen Dr. Maryjean Ka in pain management and different treatment options were discussed including injections. She wanted to avoid injections. She was doing some neck exercises she had found online. Local heat and TENS were helpful temporarily. She did not pursue outpatient physical therapy. She reported ongoing good results with CPAP therapy and was compliant with treatment.  I reviewed her CPAP compliance data from 09/27/2015 through 10/26/2015 which is a total of 30 days during which time she used her machine 28 days with percent used days greater than 4 hours at 90%, indicating excellent compliance with an average usage of 5 hours and 56 minutes, residual AHI 2 per hour, leak low with the 95th percentile at 5.2 L/m on a pressure of 7 cm with EPR of 3.  I saw her on 10/28/2014, at which time she reported still struggling to keep the CPAP on at night. She was having ongoing issues with neck pain and shoulder pain. She had no significant issues with muscle twitching. She felt that overall she had improved and her muscle twitches had improved after starting CPAP therapy. She had new medication for her blood pressure. She had noticed some lower extremity swelling. She reported a diagnosis  of fibromyalgia years ago by Dr. Erling Cruz. She had never seen a rheumatologist for this. I suggested a referral to rheumatology.  I reviewed her CPAP compliance data from 03/27/2015 through 04/25/2015 which is a total of 30 days during which time she used her machine 27 days, with percent used days greater than 4 hours at 83%, indicating very good compliance with an average usage of 6 hours and 37 minutes with days on treatment. Residual AHI low at 1.4 per hour, leaked low with the 95th percentile at 7.8 L/m on a pressure of 7 cm with EPR of 3.  I saw her on 04/27/2014, at which time she reported problems with her CPAP machine headgear. Otherwise she was feeling well and improved with ongoing use of CPAP. She had recently noted some blood in her stool and had some abdominal pain and had an appointment with her primary care physician the same day. She reported intermittent neck pain and some neck stiffness, occasional left arm jerking, mild short-term memory issues and some lower extremity edema. I suggested no new medications and encouraged her to continue using CPAP.  I reviewed her compliance data from 09/25/2014 10/24/2014 which is a total of 30 days during which time she used her machine 26 days with percent used days greater than 4 hours of 77%, indicating adequate compliance with an average usage of 5 hours and 29 minutes, residual AHI acceptable at 2.3 per hour and leak acceptable at 7.3 L/m at the 95th percentile on a pressure of 7 cm with EPR of 3.  I saw her on 10/27/2013, at which time she reported adjusting to CPAP better than she had anticipated. She had noted an improvement in her muscle jerks, and her energy level and was overall quite pleased with how she was doing with CPAP and pleasantly surprised. After stopping hydrochlorothiazide she had noticed an increase in her ankle edema. She had recently had thyroid function tested.  I reviewed her compliance data from 03/24/2014 through  04/22/2014 which is a total of 30 days during which time she used her machine 26 days. Percent used days greater than 4 hours was 87%, indicating very good compliance. Average usage was 5 hours and 40 minutes. Residual AHI at 1.9 per hour and leak was low at 5.5 L per minute for the 95th percentile.  I saw her on 09/10/2013, at which time she reported more daytime somnolence and jerking of her muscles on the left side and worsening of her short-term memory. She was previously diagnosed with sleep apnea but was not on treatment with CPAP at the time. I suggested a sleep study. She had a split-night sleep study on 09/10/2013, and I went over her sleep test results with her in detail today: Baseline sleep efficiency was reduced at 69.5% with a latency to sleep of 12.5 minutes and wake after sleep onset of 42 minutes with severe sleep fragmentation noted. She had an elevated arousal index. She had an increased percentage of stage I and stage II sleep, and absence of deep sleep and REM sleep  are to CPAP. She had occasional PVCs. She had moderate periodic leg movements of sleep at 44 per hour with an associated arousal index of 3.4 per hour. She had mild snoring. She had a total AHI of 19.8 per hour. Baseline oxygen saturation was 92%, nadir was 81%. She was then started on CPAP and titrated from 5-7 cm. Her AHI was reduced to 0 events per hour at the final pressure with supine REM sleep achieved. She had an improvement in her arousal index. She had normal stage I sleep, an increased percentage of stage II sleep, absence of slow-wave sleep and an increased percentage of REM sleep at 30%. Average oxygen saturation was improved at 96%, nadir was 93%, much improved. Snoring was a eliminated. She had very mild periodic leg movements of sleep posts CPAP with 8.5 per hour and an associated arousal index of 0 per hour. Based on the test results I started her on CPAP at 7 cm of water pressure. I reviewed the patient's CPAP  compliance data from 09/26/2013 to 09/30/2013, which is a total of 5 days, during which time the patient used CPAP every day. The average usage for all days was 6 hours and 40 minutes. The percent used days greater than 4 hours was 100 %, indicating excellent compliance in the first 5 days. The residual AHI was 3.2 per hour, indicating an appropriate treatment pressure of 7 cwp with EPR of 3.  I reviewed her compliance data from 09/26/2013 through 10/26/2013 which is a total of 31 days during which time she used CPAP every night. Percent used days greater than 4 hours was 94%, indicating excellent compliance. Average usage for all days was 6 hours and 16 minutes. Residual AHI was 2.9 per hour at a pressure of 7 cm with EPR of 3. 95th percentile of leak was 6.4 L per minute indicating a very low leak.  I first met her on 03/11/2013 for routine followup, at which time I did not suggest any medication changes. She had been sleeping better after her GI Dr. Ricard Dillon her to [facility]. I did suggest that she use her cane as needed due to muscle fatigability and lack of energy reported and evidence of slight insecurity with standing and walking.  She previously followed with Dr. Morene Antu and was last seen by him on 10/04/2012, at which time he requested a repeat sleep study and blood work.  She was unable to tolerate CPAP with an underlying history of OSA in the past. She had trouble falling asleep and also endorsed RLS symptoms. She was diagnosed with fibromyalgia at the polio clinic in [place], [place]. Dr. Erling Cruz started seeing her in November 2009. She has been complaining of postnasal drip and constant nasal drainage for which she has seen ENT. She has also seen an allergist. She has been tried on Astelin, Nasonex, Singulair, Allegra and Ambien as well as steroids. She has joint pain particularly right hip pain. She also has right knee pain. She has had some memory loss. She was diagnosed with reflux and  was placed on Dexilant, after which she was sleeping much better, including much less nasal drainage and much less post-nasal drip. She had to stop Baclofen, which was very sedating to her. She has been having trouble focusing. Her memory has been stable and she has done well with regards to her motor weakness. She has had muscle spasms in the L arm and describes intermittent L hand jerking. She has fallen in the past.  Observations/Objective:  Generalized: Well developed, in no acute distress  Mentation: Alert oriented to time, place, history taking. Follows all commands speech and language fluent   Assessment and Plan:  78 y.o. year old female  has a past medical history of Arthritis, Blood transfusion without reported diagnosis, Cataract, Colitis, Depression, Diverticulosis, Encounter for Zostavax administration (02/10/2008), Fibromyalgia, GERD (gastroesophageal reflux disease), Hemorrhoids, internal, Hypertension, Immunization, tetanus-diphtheria (10/2002), Neuromuscular disorder ([place]), Pneumococcal vaccination given (01/2009), Polio, and Ulcer. with    ICD-10-CM   1. OBSTRUCTIVE SLEEP APNEA G47.33 For home use only DME continuous positive airway pressure (CPAP)   Mrs. Perkin is doing very well on CPAP therapy.  Unfortunately we are unable to review download report due to corona pandemic.  She was advised to follow-up with me in the office in 6 months to ensure that download is reviewed at that time.  I have encouraged nightly use of CPAP and for greater than 4 hours each night.  She was advised that she can reach out with any new concerns request.  She will follow-up with me in the office in 6 months.  She verbalizes understanding and agreement with this plan.  Orders Placed This Encounter  Procedures  . For home use only DME continuous positive airway pressure (CPAP)    Supplies please    Order Specific Question:   Patient has OSA or probable OSA    Answer:   Yes    Order Specific  Question:   Is the patient currently using CPAP in the home    Answer:   Yes    Order Specific Question:   Settings    Answer:   Other see comments    Order Specific Question:   CPAP supplies needed    Answer:   Mask, headgear, cushions, filters, heated tubing and water chamber    No orders of the defined types were placed in this encounter.    Follow Up Instructions:  I discussed the assessment and treatment plan with the patient. The patient was provided an opportunity to ask questions and all were answered. The patient agreed with the plan and demonstrated an understanding of the instructions.   The patient was advised to call back or seek an in-person evaluation if the symptoms worsen or if the condition fails to improve as anticipated.  I provided 25 minutes of non-face-to-face time during this encounter. Patient is located at her place of residence during visit.  Provider is located at her place of residence.  Lester Travis, RN help to facilitate visit.  Debbora Presto, NP   I reviewed the above note and documentation by the Nurse Practitioner and agree with the history, assessment and plan as outlined above. I was immediately available for a phone consultation. Star Age, MD, PhD [facility])

## 2019-01-15 ENCOUNTER — Other Ambulatory Visit: Payer: Self-pay | Admitting: Family Medicine

## 2019-01-15 DIAGNOSIS — M5416 Radiculopathy, lumbar region: Secondary | ICD-10-CM

## 2019-01-16 ENCOUNTER — Other Ambulatory Visit: Payer: Self-pay

## 2019-01-16 ENCOUNTER — Telehealth: Payer: Self-pay | Admitting: Family Medicine

## 2019-01-16 ENCOUNTER — Ambulatory Visit
Admission: RE | Admit: 2019-01-16 | Discharge: 2019-01-16 | Disposition: A | Discharge status: Home / Self Care | Payer: Medicare Other | Source: Ambulatory Visit | Attending: Family Medicine | Admitting: Family Medicine

## 2019-01-16 DIAGNOSIS — M5416 Radiculopathy, lumbar region: Secondary | ICD-10-CM

## 2019-01-16 NOTE — Telephone Encounter (Signed)
Per Shawnie Dapper, NP, pt Return in about 6 months (around 06/18/2019). I called pt and lm on vm to call [facility] to sched 6 mo f/u w Shawnie Dapper, NP

## 2019-06-18 ENCOUNTER — Ambulatory Visit: Payer: Medicare Other | Admitting: Family Medicine

## 2019-06-25 ENCOUNTER — Ambulatory Visit: Payer: Medicare Other | Admitting: Family Medicine

## 2019-06-25 ENCOUNTER — Encounter: Payer: Self-pay | Admitting: Family Medicine

## 2019-08-04 NOTE — Progress Notes (Deleted)
HPI: Follow-up hypertension.  Previously followed by Dr. Graciela Husbands.  Apparently had previous evaluation to exclude hyperaldosteronism and pheochromocytoma.  Echocardiogram October 2017 showed normal LV function.  Cardiac CTA November 2018 showed calcium score of 29, less than 30% left main, less than 30% LAD. Aortic root mildly dilated at 38 mm. Renal Dopplers November 2018 showed no renal artery stenosis.  Last seen November 2018.  Since then  Current Outpatient Medications  Medication Sig Dispense Refill  . dexlansoprazole (DEXILANT) 60 MG capsule Take 1 capsule (60 mg total) by mouth daily. 90 capsule 3  . hydrochlorothiazide (MICROZIDE) 12.5 MG capsule Take 12.5 mg by mouth daily.     Marland Kitchen levothyroxine (SYNTHROID, LEVOTHROID) 125 MCG tablet Take 125 mcg by mouth daily.     . metoprolol tartrate (LOPRESSOR) 50 MG tablet Take 1 tablet (50 mg) by mouth 1 hour prior to test 1 tablet 0  . midodrine (PROAMATINE) 2.5 MG tablet Take 1 tablet by mouth every 4 hours while awake if your systolic blood pressure is below 110 90 tablet 1   No current facility-administered medications for this visit.      Past Medical History:  Diagnosis Date  . Arthritis   . Blood transfusion without reported diagnosis   . Cataract   . Colitis   . Depression   . Diverticulosis   . Encounter for Zostavax administration 02/10/2008  . Fibromyalgia   . GERD (gastroesophageal reflux disease)   . Hemorrhoids, internal   . Hypertension   . Immunization, tetanus-diphtheria 10/2002  . Neuromuscular disorder ([facility])   . Pneumococcal vaccination given 01/2009  . Polio   . Ulcer     Past Surgical History:  Procedure Laterality Date  . ABDOMINAL HYSTERECTOMY    . APPENDECTOMY    . CHOLECYSTECTOMY    . EYE SURGERY      Social History   Socioeconomic History  . Marital status: Married    Spouse name: Starleen Blue  . Number of children: 2  . Years of education: College  . Highest education level: Not on file   Occupational History  . Occupation: Retired Data processing manager  . Financial resource strain: Not on file  . Food insecurity    Worry: Not on file    Inability: Not on file  . Transportation needs    Medical: Not on file    Non-medical: Not on file  Tobacco Use  . Smoking status: Never Smoker  . Smokeless tobacco: Never Used  Substance and Sexual Activity  . Alcohol use: No  . Drug use: No  . Sexual activity: Yes  Lifestyle  . Physical activity    Days per week: Not on file    Minutes per session: Not on file  . Stress: Not on file  Relationships  . Social Musician on phone: Not on file    Gets together: Not on file    Attends religious service: Not on file    Active member of club or organization: Not on file    Attends meetings of clubs or organizations: Not on file    Relationship status: Not on file  . Intimate partner violence    Fear of current or ex partner: Not on file    Emotionally abused: Not on file    Physically abused: Not on file    Forced sexual activity: Not on file  Other Topics Concern  . Not on file  Social History Narrative  Pt lives at home with spouse in a one story home.  Has one daughter.  Retired Pharmacist, hospital.  Education: college.    Caffeine Use: quit 09/2012   Patient is right handed    Family History  Problem Relation Age of Onset  . Rheum arthritis Mother   . Liver disease Father   . Cancer Sister        Lung    ROS: no fevers or chills, productive cough, hemoptysis, dysphasia, odynophagia, melena, hematochezia, dysuria, hematuria, rash, seizure activity, orthopnea, PND, pedal edema, claudication. Remaining systems are negative.  Physical Exam: Well-developed well-nourished in no acute distress.  Skin is warm and dry.  HEENT is normal.  Neck is supple.  Chest is clear to auscultation with normal expansion.  Cardiovascular exam is regular rate and rhythm.  Abdominal exam nontender or distended. No masses palpated.  Extremities show no edema. neuro grossly intact  ECG- personally reviewed  A/P  1 labile hypertension-  Kirk Ruths, MD

## 2019-08-13 ENCOUNTER — Ambulatory Visit: Payer: Medicare Other | Admitting: Cardiology

## 2019-08-20 NOTE — Progress Notes (Signed)
HPI: Follow-up hypertension.  Previously followed by Dr. Caryl Comes.  Apparently had previous evaluation to exclude hyperaldosteronism and pheochromocytoma. Echocardiogram October 2017 showed normal LV function.  Cardiac CTA November 2018 showed calcium score of 29, less than 30% left main, less than 30% LAD. Aortic root mildly dilated at 38 mm. Renal Dopplers November 2018 showed no renal artery stenosis.  Last seen November 2018.  Since then patient has had episodes of low blood pressure where she feels extremely fatigued.  This can last 2 days at a time.  She occasionally takes midodrine to relieve the symptoms.  Otherwise her systolic blood pressure runs approximately 135.  She has dyspnea with more vigorous activities but not routine activities.  No exertional chest pain, palpitations or syncope.  Current Outpatient Medications  Medication Sig Dispense Refill  . dexlansoprazole (DEXILANT) 60 MG capsule Take 1 capsule (60 mg total) by mouth daily. 90 capsule 3  . hydrochlorothiazide (MICROZIDE) 12.5 MG capsule Take 12.5 mg by mouth daily.     Marland Kitchen levothyroxine (SYNTHROID) 137 MCG tablet Take 137 mcg by mouth daily before breakfast.    . midodrine (PROAMATINE) 2.5 MG tablet Take 1 tablet by mouth every 4 hours while awake if your systolic blood pressure is below 110 90 tablet 1   No current facility-administered medications for this visit.     Past Medical History:  Diagnosis Date  . Arthritis   . Blood transfusion without reported diagnosis   . Cataract   . Colitis   . Depression   . Diverticulosis   . Encounter for Zostavax administration 02/10/2008  . Fibromyalgia   . GERD (gastroesophageal reflux disease)   . Hemorrhoids, internal   . Hypertension   . Immunization, tetanus-diphtheria 10/2002  . Neuromuscular disorder ([place])   . Pneumococcal vaccination given 01/2009  . Polio   . Ulcer     Past Surgical History:  Procedure Laterality Date  . ABDOMINAL HYSTERECTOMY    .  APPENDECTOMY    . CHOLECYSTECTOMY    . EYE SURGERY      Social History   Socioeconomic History  . Marital status: Married    Spouse name: Will Bonnet  . Number of children: 2  . Years of education: College  . Highest education level: Not on file  Occupational History  . Occupation: Retired Pharmacist, hospital  Tobacco Use  . Smoking status: Never Smoker  . Smokeless tobacco: Never Used  Substance and Sexual Activity  . Alcohol use: No  . Drug use: No  . Sexual activity: Yes  Other Topics Concern  . Not on file  Social History Narrative   Pt lives at home with spouse in a one story home.  Has one daughter.  Retired Pharmacist, hospital.  Education: college.    Caffeine Use: quit 09/2012   Patient is right handed   Social Determinants of Health   Financial Resource Strain:   . Difficulty of Paying Living Expenses: Not on file  Food Insecurity:   . Worried About Charity fundraiser in the Last Year: Not on file  . Ran Out of Food in the Last Year: Not on file  Transportation Needs:   . Lack of Transportation (Medical): Not on file  . Lack of Transportation (Non-Medical): Not on file  Physical Activity:   . Days of Exercise per Week: Not on file  . Minutes of Exercise per Session: Not on file  Stress:   . Feeling of Stress : Not on file  Social Connections:   . Frequency of Communication with Friends and Family: Not on file  . Frequency of Social Gatherings with Friends and Family: Not on file  . Attends Religious Services: Not on file  . Active Member of Clubs or Organizations: Not on file  . Attends Banker Meetings: Not on file  . Marital Status: Not on file  Intimate Partner Violence:   . Fear of Current or Ex-Partner: Not on file  . Emotionally Abused: Not on file  . Physically Abused: Not on file  . Sexually Abused: Not on file    Family History  Problem Relation Age of Onset  . Rheum arthritis Mother   . Liver disease Father   . Cancer Sister        Lung     ROS: no fevers or chills, productive cough, hemoptysis, dysphasia, odynophagia, melena, hematochezia, dysuria, hematuria, rash, seizure activity, orthopnea, PND, pedal edema, claudication. Remaining systems are negative.  Physical Exam: Well-developed well-nourished in no acute distress.  Skin is warm and dry.  HEENT is normal.  Neck is supple.  Chest is clear to auscultation with normal expansion.  Cardiovascular exam is regular rate and rhythm.  Abdominal exam nontender or distended. No masses palpated. Extremities show no edema. neuro grossly intact  ECG-normal sinus rhythm at a rate of 69, no ST changes.  Personally reviewed  A/P  1 labile hypertension-patient has had problems with low blood pressure at times.  This can last 2 days with each episode.  I would like to allow her blood pressure to run higher to avoid these low events.  I will discontinue hydrochlorothiazide and continue losartan for now.  Follow blood pressure and we will adjust based on follow-up readings.  If pressure remains stable we may back off on losartan as well.  She can continue midodrine for now as needed.  2 history of chest pain-previous CTA showed nonobstructive coronary disease.  Olga Millers, MD

## 2019-08-25 ENCOUNTER — Ambulatory Visit: Payer: Medicare Other | Admitting: Cardiology

## 2019-08-25 ENCOUNTER — Encounter: Payer: Self-pay | Admitting: Cardiology

## 2019-08-25 ENCOUNTER — Other Ambulatory Visit: Payer: Self-pay

## 2019-08-25 VITALS — BP 138/70 | HR 69 | Temp 95.7°F | Ht 63.5 in | Wt 140.0 lb

## 2019-08-25 DIAGNOSIS — I10 Essential (primary) hypertension: Secondary | ICD-10-CM

## 2019-08-25 DIAGNOSIS — R072 Precordial pain: Secondary | ICD-10-CM | POA: Diagnosis not present

## 2019-08-25 NOTE — Patient Instructions (Signed)
Medication Instructions:  STOP HCTZ *If you need a refill on your cardiac medications before your next appointment, please call your pharmacy*  Lab Work: NONE NEEDED If you have labs (blood work) drawn today and your tests are completely normal, you will receive your results only by: Marland Kitchen MyChart Message (if you have MyChart) OR . A paper copy in the mail If you have any lab test that is abnormal or we need to change your treatment, we will call you to review the results.  Testing/Procedures: NONE NEEDED  Follow-Up: At [facility], you and your health needs are our priority.  As part of our continuing mission to provide you with exceptional heart care, we have created designated Provider Care Teams.  These Care Teams include your primary Cardiologist (physician) and Advanced Practice Providers (APPs -  Physician Assistants and Nurse Practitioners) who all work together to provide you with the care you need, when you need it.  Your next appointment:   3 month(s)  The format for your next appointment:   In Person  Provider:   You may see Dr. Stanford Breed or one of the following Advanced Practice Providers on your designated Care Team:    Kerin Ransom, PA-C  [place], [place]  Coletta Memos, [place]

## 2019-09-01 ENCOUNTER — Telehealth: Payer: Self-pay | Admitting: Cardiology

## 2019-09-01 DIAGNOSIS — R072 Precordial pain: Secondary | ICD-10-CM

## 2019-09-01 DIAGNOSIS — I10 Essential (primary) hypertension: Secondary | ICD-10-CM

## 2019-09-01 NOTE — Telephone Encounter (Signed)
Spoke with pt who states Dr. Stanford Breed took her off of HCTZ d/t episodes of low BP, but she has progressive edema to BLE and would like recommendations. Pt states during episodes she felt like she was going to pass out and at times had SOB. She states she saw Dr. Stanford Breed sometime last week (12/21) and had episode about a day after where her BP was 116/63 although she was symptomatic. She states she noticed some puffiness to bilat ankles near the end of last week but by Saturday/Sunday her legs were so swollen she had difficulty bending her knees. Pt denies indiscretion with salt and states she tries to keep legs elevated as much as possible. She states her BPs have been running around 143/85. Informed pt that Dr. Stanford Breed out of office but triage nurse would consult DOD Dr. Audie Box and call back with recommendations.  Consulted DOD Dr. Audie Box who recommended that pt take one tab of her HCTZ today and can have f/u appt Thursday if pt still having issues.   Contacted pt and advised of Dr. Audie Box recommendations. Triage nurse advised pt to monitor BP and symptoms and contact office 12/30 to update on status of BLE edema. Pt agreeable

## 2019-09-01 NOTE — Telephone Encounter (Signed)
Patient called stating Dr. Stanford Breed took her off of HCTZ, and her legs and feet are currently swollen. She thinks she needs to be back on the medications, she would like some advice.

## 2019-09-02 ENCOUNTER — Telehealth: Payer: Self-pay | Admitting: Cardiology

## 2019-09-02 MED ORDER — HYDROCHLOROTHIAZIDE 12.5 MG PO CAPS: 12.5000 mg | ORAL_CAPSULE | Freq: Every day | ORAL | 3 refills | Status: DC

## 2019-09-02 MED ORDER — LOSARTAN POTASSIUM 25 MG PO TABS: 25.0000 mg | ORAL_TABLET | Freq: Every day | ORAL | 3 refills | Status: DC

## 2019-09-02 NOTE — Telephone Encounter (Signed)
error 

## 2019-09-02 NOTE — Telephone Encounter (Signed)
Spoke with patient to advise her of Dr. Jacalyn Lefevre recommendations. Per Dr. Stanford Breed decrease Losartan to 25 mg daily and resume HCTZ 12.5 mg daily; bmet one week; follow BP.   Patient verbalized understanding. Medications sent to pharmacy, lab slips mailed to patient.

## 2019-09-02 NOTE — Telephone Encounter (Signed)
Patient is calling back in reference to HCTZ and Losartan. Patient has always only taken one of the HCTZ daily. As for the Losartan patient wants to know if she can split her dosage.

## 2019-09-02 NOTE — Addendum Note (Signed)
Addended by: Sherrie Mustache on: 09/02/2019 03:38 PM   Modules accepted: Orders

## 2019-09-02 NOTE — Telephone Encounter (Signed)
Spoke with patient. Patient reports she has 50 mg of Losartan and that they are scored. Patient wanted to let us know she will break tablets in half until she uses the Losartan she has left.   Clarified HCTZ dosage with patient.   Patient verbalizes understanding.

## 2019-09-02 NOTE — Telephone Encounter (Signed)
Pt was on losartan at previous ov; would decrease to 25 mg daily and resume HCTZ 12.5 mg daily; bmet one week; follow BP Kirk Ruths

## 2019-09-10 LAB — BASIC METABOLIC PANEL
BUN/Creatinine Ratio: 23 (ref 12–28)
BUN: 18 mg/dL (ref 8–27)
CO2: 24 mmol/L (ref 20–29)
Calcium: 9.6 mg/dL (ref 8.7–10.3)
Chloride: 105 mmol/L (ref 96–106)
Creatinine, Ser: 0.79 mg/dL (ref 0.57–1.00)
GFR calc Af Amer: 84 mL/min/{1.73_m2} (ref >59)
GFR calc non Af Amer: 73 mL/min/{1.73_m2} (ref >59)
Glucose: 79 mg/dL (ref 65–99)
Potassium: 4.2 mmol/L (ref 3.5–5.2)
Sodium: 143 mmol/L (ref 134–144)

## 2019-09-23 ENCOUNTER — Ambulatory Visit: Payer: Medicare PPO | Attending: Internal Medicine

## 2019-09-23 DIAGNOSIS — Z23 Encounter for immunization: Secondary | ICD-10-CM | POA: Insufficient documentation

## 2019-09-23 NOTE — Progress Notes (Signed)
   Covid-19 Vaccination Clinic  Name:  ELIZETTE SHEK    MRN: [id number] DOB: 1940-12-22  09/23/2019  Ms. Oddo was observed post Covid-19 immunization for 15 minutes without incidence. She was provided with Vaccine Information Sheet and instruction to access the V-Safe system.   Ms. Dula was instructed to call 911 with any severe reactions post vaccine: Marland Kitchen Difficulty breathing  . Swelling of your face and throat  . A fast heartbeat  . A bad rash all over your body  . Dizziness and weakness    Immunizations Administered    Name Date Dose VIS Date Route   Pfizer COVID-19 Vaccine 09/23/2019  8:42 AM 0.3 mL 08/15/2019 Intramuscular   Manufacturer: [employer], [employer]   Lot: [id number]   NDC: [address]-4

## 2019-10-13 ENCOUNTER — Other Ambulatory Visit: Payer: Self-pay

## 2019-10-13 ENCOUNTER — Ambulatory Visit: Payer: Medicare PPO | Attending: Internal Medicine

## 2019-10-13 DIAGNOSIS — Z23 Encounter for immunization: Secondary | ICD-10-CM | POA: Insufficient documentation

## 2019-10-13 NOTE — Progress Notes (Signed)
   Covid-19 Vaccination Clinic  Name:  VIRGINA DEAKINS    MRN: [id number] DOB: 22-Aug-1941  10/13/2019  Ms. Blinder was observed post Covid-19 immunization for 15 minutes without incidence. She was provided with Vaccine Information Sheet and instruction to access the V-Safe system.   Ms. Brazee was instructed to call 911 with any severe reactions post vaccine: Marland Kitchen Difficulty breathing  . Swelling of your face and throat  . A fast heartbeat  . A bad rash all over your body  . Dizziness and weakness    Immunizations Administered    Name Date Dose VIS Date Route   Pfizer COVID-19 Vaccine 10/13/2019  8:37 AM 0.3 mL 08/15/2019 Intramuscular   Manufacturer: [employer], [employer]   Lot: [id number]   NDC: [address]-6

## 2019-11-19 NOTE — Progress Notes (Signed)
HPI: Follow-up hypertension.  Previously followed by Dr. Caryl Comes.  Apparently had previous evaluation to exclude hyperaldosteronism and pheochromocytoma. Echocardiogram October 2017 showed normal LV function. Cardiac CTA November 2018 showed calcium score of 29, less than 30% left main, less than 30% LAD. Aortic root mildly dilated at 38 mm. Renal Dopplers November 2018 showed no renal artery stenosis.  Since last seen, she has dyspnea with more vigorous activities but not routine activities.  Her pedal edema has improved with reinitiation of hydrochlorothiazide.  No chest pain or syncope.  She continues to have difficulties with low blood pressure at times causing severe fatigue.  I will end up stopping the losartan.  Current Outpatient Medications  Medication Sig Dispense Refill  . dexlansoprazole (DEXILANT) 60 MG capsule Take 1 capsule (60 mg total) by mouth daily. 90 capsule 3  . hydrochlorothiazide (MICROZIDE) 12.5 MG capsule Take 1 capsule (12.5 mg total) by mouth daily. 90 capsule 3  . levothyroxine (SYNTHROID) 137 MCG tablet Take 137 mcg by mouth daily before breakfast.    . losartan (COZAAR) 25 MG tablet Take 1 tablet (25 mg total) by mouth daily. 90 tablet 3  . midodrine (PROAMATINE) 2.5 MG tablet Take 1 tablet by mouth every 4 hours while awake if your systolic blood pressure is below 110 90 tablet 1  . sertraline (ZOLOFT) 50 MG tablet Take 50 mg by mouth daily.     No current facility-administered medications for this visit.     Past Medical History:  Diagnosis Date  . Arthritis   . Blood transfusion without reported diagnosis   . Cataract   . Colitis   . Depression   . Diverticulosis   . Encounter for Zostavax administration 02/10/2008  . Fibromyalgia   . GERD (gastroesophageal reflux disease)   . Hemorrhoids, internal   . Hypertension   . Immunization, tetanus-diphtheria 10/2002  . Neuromuscular disorder ([place])   . Pneumococcal vaccination given 01/2009  . Polio   .  Ulcer     Past Surgical History:  Procedure Laterality Date  . ABDOMINAL HYSTERECTOMY    . APPENDECTOMY    . CHOLECYSTECTOMY    . EYE SURGERY      Social History   Socioeconomic History  . Marital status: Married    Spouse name: Will Bonnet  . Number of children: 2  . Years of education: College  . Highest education level: Not on file  Occupational History  . Occupation: Retired Pharmacist, hospital  Tobacco Use  . Smoking status: Never Smoker  . Smokeless tobacco: Never Used  Substance and Sexual Activity  . Alcohol use: No  . Drug use: No  . Sexual activity: Yes  Other Topics Concern  . Not on file  Social History Narrative   Pt lives at home with spouse in a one story home.  Has one daughter.  Retired Pharmacist, hospital.  Education: college.    Caffeine Use: quit 09/2012   Patient is right handed   Social Determinants of Health   Financial Resource Strain:   . Difficulty of Paying Living Expenses:   Food Insecurity:   . Worried About Charity fundraiser in the Last Year:   . Arboriculturist in the Last Year:   Transportation Needs:   . Film/video editor (Medical):   Marland Kitchen Lack of Transportation (Non-Medical):   Physical Activity:   . Days of Exercise per Week:   . Minutes of Exercise per Session:   Stress:   .  Feeling of Stress :   Social Connections:   . Frequency of Communication with Friends and Family:   . Frequency of Social Gatherings with Friends and Family:   . Attends Religious Services:   . Active Member of Clubs or Organizations:   . Attends Banker Meetings:   Marland Kitchen Marital Status:   Intimate Partner Violence:   . Fear of Current or Ex-Partner:   . Emotionally Abused:   Marland Kitchen Physically Abused:   . Sexually Abused:     Family History  Problem Relation Age of Onset  . Rheum arthritis Mother   . Liver disease Father   . Cancer Sister        Lung    ROS: no fevers or chills, productive cough, hemoptysis, dysphasia, odynophagia, melena, hematochezia,  dysuria, hematuria, rash, seizure activity, orthopnea, PND, pedal edema, claudication. Remaining systems are negative.  Physical Exam: Well-developed well-nourished in no acute distress.  Skin is warm and dry.  HEENT is normal.  Neck is supple.  Chest is clear to auscultation with normal expansion.  Cardiovascular exam is regular rate and rhythm.  Abdominal exam nontender or distended. No masses palpated. Extremities show no edema. neuro grossly intact   A/P  1 labile hypertension-at previous office visit we discontinued hydrochlorothiazide (she was having spells of low blood pressure lasting up to 2 days at a time and we wanted to avoid).  However she developed worsening pedal edema and the diuretic was resumed.  She continues to have spells of low blood pressure with associated fatigue.  We will discontinue losartan and follow.  We will allow her blood pressure to run higher to avoid these periods of low blood pressure.  2 history of chest pain-no recurrent symptoms.  Previous CTA showed nonobstructive coronary disease.  Olga Millers, MD

## 2019-11-27 ENCOUNTER — Encounter: Payer: Self-pay | Admitting: Cardiology

## 2019-11-27 ENCOUNTER — Other Ambulatory Visit: Payer: Self-pay

## 2019-11-27 ENCOUNTER — Ambulatory Visit: Payer: Medicare PPO | Admitting: Cardiology

## 2019-11-27 VITALS — BP 156/80 | HR 85 | Ht 63.0 in | Wt 145.0 lb

## 2019-11-27 DIAGNOSIS — R072 Precordial pain: Secondary | ICD-10-CM | POA: Diagnosis not present

## 2019-11-27 DIAGNOSIS — I10 Essential (primary) hypertension: Secondary | ICD-10-CM

## 2019-11-27 NOTE — Patient Instructions (Signed)
Medication Instructions:  STOP LOSARTAN *If you need a refill on your cardiac medications before your next appointment, please call your pharmacy*   Lab Work: If you have labs (blood work) drawn today and your tests are completely normal, you will receive your results only by: Marland Kitchen MyChart Message (if you have MyChart) OR . A paper copy in the mail If you have any lab test that is abnormal or we need to change your treatment, we will call you to review the results.  Follow-Up: At [facility], you and your health needs are our priority.  As part of our continuing mission to provide you with exceptional heart care, we have created designated Provider Care Teams.  These Care Teams include your primary Cardiologist (physician) and Advanced Practice Providers (APPs -  Physician Assistants and Nurse Practitioners) who all work together to provide you with the care you need, when you need it.  We recommend signing up for the patient portal called "MyChart".  Sign up information is provided on this After Visit Summary.  MyChart is used to connect with patients for Virtual Visits (Telemedicine).  Patients are able to view lab/test results, encounter notes, upcoming appointments, etc.  Non-urgent messages can be sent to your provider as well.   To learn more about what you can do with MyChart, go to [email or web address].    Your next appointment:   6 month(s)  The format for your next appointment:   Either In Person or Virtual  Provider:   You may see Olga Millers MD or one of the following Advanced Practice Providers on your designated Care Team:    Corine Shelter, PA-C  [place], [place]  Edd Fabian, [place]

## 2020-02-17 ENCOUNTER — Telehealth: Payer: Self-pay | Admitting: Family Medicine

## 2020-02-17 NOTE — Telephone Encounter (Signed)
Pt called stating her cpap machine completely stopped working and she is needing to know what the process is to be able to get another machine. Please advise.

## 2020-02-18 ENCOUNTER — Telehealth: Payer: Self-pay

## 2020-02-18 NOTE — Telephone Encounter (Signed)
Spoke to pt, She will bring cpap to appt

## 2020-02-19 ENCOUNTER — Ambulatory Visit: Payer: Medicare PPO | Admitting: Adult Health

## 2020-02-19 ENCOUNTER — Other Ambulatory Visit: Payer: Self-pay

## 2020-02-19 ENCOUNTER — Encounter: Payer: Self-pay | Admitting: Adult Health

## 2020-02-19 VITALS — BP 146/88 | HR 78 | Ht 63.0 in | Wt 141.0 lb

## 2020-02-19 DIAGNOSIS — G4733 Obstructive sleep apnea (adult) (pediatric): Secondary | ICD-10-CM

## 2020-02-19 DIAGNOSIS — Z789 Other specified health status: Secondary | ICD-10-CM

## 2020-02-19 DIAGNOSIS — Z9989 Dependence on other enabling machines and devices: Secondary | ICD-10-CM | POA: Diagnosis not present

## 2020-02-19 NOTE — Progress Notes (Addendum)
PATIENT: Erica Davidson DOB: 1941/05/09  REASON FOR VISIT: CPAP follow up HISTORY FROM: patient    History of Present Illness:    Today, 02/18/2020, Erica Davidson is being seen for CPAP follow up. She reports machine has been cutting off while using it and now becoming more consistent. Machine is 89 or 79 years old. She has been told my DME company that visit was needed today in order to obtain a new CPAP machine. She does report difficulty tolerating mask and feels as though use of mask keeps her up because the strap consistently moves and slides. She has tried multiple masks and due to face size, difficulty with "good fitting mask".  Questioning other alternatives for sleep apnea management.  Reviewed compliance report shows 19 out of 30 usage days with 17 days greater than 4 hours for 57% compliance.  Average usage 5 hours and 47 minutes.  Residual AHI 1.1 with set pressure 7 and EPR level 3.  Leaks in the 95th percentile 7.3 L/min.  Reports low usage rate due to machine continuously shutting off as well as difficulty with mask use.  She is currently due for new machine.     History copied from prior note from Debbora Presto, NP from 12/17/2018 Erica Davidson is a 79 y.o. female for follow up of sleep apnea on CPAP.  She reports doing well on CPAP therapy.  Unfortunately, due to virtual environment we are able to download the CPAP report.  Patient reports daily compliance with therapy.  She is able to report information from her CPAP machine that states that over the last 30 days she has used her machine greater than 4 hours 25 out of 30 days.  She states that her machine reports excellent sleep quality.  She is unable to locate any other information for today's visit.  She feels that she is doing well and sleeping well with CPAP.  She is requesting supplies today.   Erica Davidson is a very pleasant 79 year old right handed woman with an underlying medical history of post polio syndrome with history of  polio at age 37, hypothyroidism, hypertension, hyperlipidemia, fibromyalgia, tremor, neck pain and right knee pain and allergies, who presents for followup consultation of her OSA, and RLS with PLMs. She is unaccompanied today. I last saw her on 10/28/2015, at which time she was compliant with her CPAP. She reported a recent upper respiratory infection for which she was treated with nebulizer, and antibiotics. She was not able to use her CPAP during a period of about 6 weeks. She needed new CPAP supplies.  Today, 04/26/2017: I could not review her CPAP compliance data d/t no card available. She is okay with bringing by the card at some point when she is in this area. She reports increase in fatigue and sleepiness. She does not sleep very well at night. She does take a nap typically on a day-to-day basis. She has had forgetfulness. She has noticed some increase in her blood pressure values recently in the past month. She typically monitors this at home.  Previously:  I saw her on 04/28/2015, at which time she reported problems with her neck. She had seen a rheumatologist, Dr. Amil Amen. She had some x-rays. He agreed that she may have fibromyalgia. She had seen Dr. Maryjean Ka in pain management and different treatment options were discussed including injections. She wanted to avoid injections. She was doing some neck exercises she had found online. Local heat and TENS were helpful temporarily. She  did not pursue outpatient physical therapy. She reported ongoing good results with CPAP therapy and was compliant with treatment.  I reviewed her CPAP compliance data from 09/27/2015 through 10/26/2015 which is a total of 30 days during which time she used her machine 28 days with percent used days greater than 4 hours at 90%, indicating excellent compliance with an average usage of 5 hours and 56 minutes, residual AHI 2 per hour, leak low with the 95th percentile at 5.2 L/m on a pressure of 7 cm with EPR of  3.   I saw her on 10/28/2014, at which time she reported still struggling to keep the CPAP on at night. She was having ongoing issues with neck pain and shoulder pain. She had no significant issues with muscle twitching. She felt that overall she had improved and her muscle twitches had improved after starting CPAP therapy. She had new medication for her blood pressure. She had noticed some lower extremity swelling. She reported a diagnosis of fibromyalgia years ago by Dr. Erling Cruz. She had never seen a rheumatologist for this. I suggested a referral to rheumatology.  I reviewed her CPAP compliance data from 03/27/2015 through 04/25/2015 which is a total of 30 days during which time she used her machine 27 days, with percent used days greater than 4 hours at 83%, indicating very good compliance with an average usage of 6 hours and 37 minutes with days on treatment. Residual AHI low at 1.4 per hour, leaked low with the 95th percentile at 7.8 L/m on a pressure of 7 cm with EPR of 3.  I saw her on 04/27/2014, at which time she reported problems with her CPAP machine headgear. Otherwise she was feeling well and improved with ongoing use of CPAP. She had recently noted some blood in her stool and had some abdominal pain and had an appointment with her primary care physician the same day. She reported intermittent neck pain and some neck stiffness, occasional left arm jerking, mild short-term memory issues and some lower extremity edema. I suggested no new medications and encouraged her to continue using CPAP.  I reviewed her compliance data from 09/25/2014 10/24/2014 which is a total of 30 days during which time she used her machine 26 days with percent used days greater than 4 hours of 77%, indicating adequate compliance with an average usage of 5 hours and 29 minutes, residual AHI acceptable at 2.3 per hour and leak acceptable at 7.3 L/m at the 95th percentile on a pressure of 7 cm with EPR of 3.  I saw  her on 10/27/2013, at which time she reported adjusting to CPAP better than she had anticipated. She had noted an improvement in her muscle jerks, and her energy level and was overall quite pleased with how she was doing with CPAP and pleasantly surprised. After stopping hydrochlorothiazide she had noticed an increase in her ankle edema. She had recently had thyroid function tested.  I reviewed her compliance data from 03/24/2014 through 04/22/2014 which is a total of 30 days during which time she used her machine 26 days. Percent used days greater than 4 hours was 87%, indicating very good compliance. Average usage was 5 hours and 40 minutes. Residual AHI at 1.9 per hour and leak was low at 5.5 L per minute for the 95th percentile.  I saw her on 09/10/2013, at which time she reported more daytime somnolence and jerking of her muscles on the left side and worsening of her short-term memory. She was  previously diagnosed with sleep apnea but was not on treatment with CPAP at the time. I suggested a sleep study. She had a split-night sleep study on 09/10/2013, and I went over her sleep test results with her in detail today: Baseline sleep efficiency was reduced at 69.5% with a latency to sleep of 12.5 minutes and wake after sleep onset of 42 minutes with severe sleep fragmentation noted. She had an elevated arousal index. She had an increased percentage of stage I and stage II sleep, and absence of deep sleep and REM sleep are to CPAP. She had occasional PVCs. She had moderate periodic leg movements of sleep at 44 per hour with an associated arousal index of 3.4 per hour. She had mild snoring. She had a total AHI of 19.8 per hour. Baseline oxygen saturation was 92%, nadir was 81%. She was then started on CPAP and titrated from 5-7 cm. Her AHI was reduced to 0 events per hour at the final pressure with supine REM sleep achieved. She had an improvement in her arousal index. She had normal stage I sleep, an  increased percentage of stage II sleep, absence of slow-wave sleep and an increased percentage of REM sleep at 30%. Average oxygen saturation was improved at 96%, nadir was 93%, much improved. Snoring was a eliminated. She had very mild periodic leg movements of sleep posts CPAP with 8.5 per hour and an associated arousal index of 0 per hour. Based on the test results I started her on CPAP at 7 cm of water pressure. I reviewed the patient's CPAP compliance data from 09/26/2013 to 09/30/2013, which is a total of 5 days, during which time the patient used CPAP every day. The average usage for all days was 6 hours and 40 minutes. The percent used days greater than 4 hours was 100 %, indicating excellent compliance in the first 5 days. The residual AHI was 3.2 per hour, indicating an appropriate treatment pressure of 7 cwp with EPR of 3.  I reviewed her compliance data from 09/26/2013 through 10/26/2013 which is a total of 31 days during which time she used CPAP every night. Percent used days greater than 4 hours was 94%, indicating excellent compliance. Average usage for all days was 6 hours and 16 minutes. Residual AHI was 2.9 per hour at a pressure of 7 cm with EPR of 3. 95th percentile of leak was 6.4 L per minute indicating a very low leak.  I first met her on 03/11/2013 for routine followup, at which time I did not suggest any medication changes. She had been sleeping better after her GI Dr. Ricard Dillon her to [facility]. I did suggest that she use her cane as needed due to muscle fatigability and lack of energy reported and evidence of slight insecurity with standing and walking.  She previously followed with Dr. Morene Antu and was last seen by him on 10/04/2012, at which time he requested a repeat sleep study and blood work.  She was unable to tolerate CPAP with an underlying history of OSA in the past. She had trouble falling asleep and also endorsed RLS symptoms. She was diagnosed with fibromyalgia at the  polio clinic in [place], [place]. Dr. Erling Cruz started seeing her in November 2009. She has been complaining of postnasal drip and constant nasal drainage for which she has seen ENT. She has also seen an allergist. She has been tried on Astelin, Nasonex, Singulair, Allegra and Ambien as well as steroids. She has joint pain particularly right  hip pain. She also has right knee pain. She has had some memory loss. She was diagnosed with reflux and was placed on Dexilant, after which she was sleeping much better, including much less nasal drainage and much less post-nasal drip. She had to stop Baclofen, which was very sedating to her. She has been having trouble focusing. Her memory has been stable and she has done well with regards to her motor weakness. She has had muscle spasms in the L arm and describes intermittent L hand jerking. She has fallen in the past.   ROS: Complaints of only as above in HPI and all other systems negative  Past Medical History:  Diagnosis Date  . Arthritis   . Blood transfusion without reported diagnosis   . Cataract   . Colitis   . Depression   . Diverticulosis   . Encounter for Zostavax administration 02/10/2008  . Fibromyalgia   . GERD (gastroesophageal reflux disease)   . Hemorrhoids, internal   . Hypertension   . Immunization, tetanus-diphtheria 10/2002  . Neuromuscular disorder ([place])   . Pneumococcal vaccination given 01/2009  . Polio   . Ulcer     Current Outpatient Medications on File Prior to Visit  Medication Sig Dispense Refill  . dexlansoprazole (DEXILANT) 60 MG capsule Take 1 capsule (60 mg total) by mouth daily. 90 capsule 3  . levothyroxine (SYNTHROID) 137 MCG tablet Take 137 mcg by mouth daily before breakfast.    . midodrine (PROAMATINE) 2.5 MG tablet Take 1 tablet by mouth every 4 hours while awake if your systolic blood pressure is below 110 90 tablet 1  . sertraline (ZOLOFT) 50 MG tablet Take 50 mg by mouth daily.    . hydrochlorothiazide  (MICROZIDE) 12.5 MG capsule Take 1 capsule (12.5 mg total) by mouth daily. 90 capsule 3  . MYRBETRIQ 25 MG TB24 tablet     . RESTASIS 0.05 % ophthalmic emulsion 1 drop 2 (two) times daily.    . traMADol (ULTRAM) 50 MG tablet      No current facility-administered medications on file prior to visit.   Allergies  Allergen Reactions  . Zithromax [Azithromycin] Shortness Of Breath  . Ciprofloxacin     REACTION: rash/itch  . Loratadine-Pseudoephedrine Er     REACTION: tachycardia  . Penicillins   . Prednisone     REACTION: high dose taper  . Codeine Palpitations    Rapid heart rate      Physical exam: Today's Vitals   02/19/20 1407  BP: (!) 146/88  Pulse: 78  Weight: 141 lb (64 kg)  Height: _0  (1.6 m)   Body mass index is 24.98 kg/m.  General: well developed, well nourished,  pleasant elderly Caucasian female, seated, in no evident distress Head: head normocephalic and atraumatic.   Neck: supple with no carotid or supraclavicular bruits Cardiovascular: regular rate and rhythm, no murmurs Musculoskeletal: no deformity Skin:  no rash/petichiae Vascular:  Normal pulses all extremities   Neurologic Exam Mental Status: Awake and fully alert. Oriented to place and time. Recent and remote memory intact. Attention span, concentration and fund of knowledge appropriate. Mood and affect appropriate.  Cranial Nerves: Pupils equal, briskly reactive to light. Extraocular movements full without nystagmus. Visual fields full to confrontation. Hearing intact. Facial sensation intact. Face, tongue, palate moves normally and symmetrically.  Motor: Normal bulk and tone. Normal strength in all tested extremity muscles. Sensory.: intact to touch , pinprick , position and vibratory sensation.  Coordination: Rapid alternating movements normal in  all extremities. Finger-to-nose and heel-to-shin performed accurately bilaterally. Gait and Station: Arises from chair without difficulty. Stance is  normal. Gait demonstrates normal stride length and balance Reflexes: 1+ and symmetric. Toes downgoing.      Assessment and Plan:  79 y.o. year old female  has a past medical history of Arthritis, Blood transfusion without reported diagnosis, Cataract, Colitis, Depression, Diverticulosis, Encounter for Zostavax administration (02/10/2008), Fibromyalgia, GERD (gastroesophageal reflux disease), Hemorrhoids, internal, Hypertension, Immunization, tetanus-diphtheria (10/2002), Neuromuscular disorder ([place]), Pneumococcal vaccination given (01/2009), Polio, and Ulcer. with    ICD-10-CM   1. OSA on CPAP  G47.33    Z99.89   2. Difficulty with CPAP use  Z78.9     Discussion regarding use of continued CPAP with ordering new CPAP machine as current one is not working which is 79 years old which qualifies her to obtain a new machine.  Due to difficulty tolerating use of mask, discussion regarding other types of treatment options such as dental device or hypoglossal nerve stimulator.  Initial AHI 19 and BMI 24.9.  Will further discuss options with Dr. Rexene Alberts and Cyril Mourning, RN as she does qualify for hypoglossal nerve stimulator but she would wish to pursue dental device first if able.  Follow-up will be determined depending on if she trials of a different type of sleep apnea treatment but if she ends up pursuing new CPAP machine, will need 30 to 90-day follow-up for insurance purposes  Orders will be placed depending on treatment plan   I spent 20 minutes of face-to-face and non-face-to-face time with patient.  This included previsit chart review, lab review, study review, order entry, electronic health record documentation, patient education  CC: Star Age, MD [facility] provider Carol Ada, MD PCP  Frann Rider, AGNP-BC  [facility] [address] [place], [place] [address]  Phone [phone] Fax [phone] Note: This document was prepared with digital dictation and  possible smart phrase technology. Any transcriptional errors that result from this process are unintentional.  I reviewed the above note and documentation by the Nurse Practitioner and agree with the history, exam, assessment and plan as outlined above. I was available for consultation. Star Age, MD, PhD [facility])

## 2020-02-23 ENCOUNTER — Telehealth: Payer: Self-pay | Admitting: Adult Health

## 2020-02-23 DIAGNOSIS — G4733 Obstructive sleep apnea (adult) (pediatric): Secondary | ICD-10-CM

## 2020-02-23 NOTE — Telephone Encounter (Signed)
Pt called wanting to speak to provider to find out what the update is on her cpap. Please advise.

## 2020-02-23 NOTE — Telephone Encounter (Signed)
I called pt and relayed that per JM/NP and Dr. Frances Furbish that the  Implant she did not want to do,m and the dental device she did not know much other then the web info.  Do you and casey know more about the dental device used and response that pts have had (pt mentioned infections, TMJ??

## 2020-02-23 NOTE — Telephone Encounter (Signed)
Dr. Frances Furbish, I saw the patient last week for CPAP follow-up as well as need of new machine.  She reported having difficulty tolerating mask and felt as though it was interfering with sleep quality.  Discussion regarding possible use of inspire device or mouth device.  She is interested in possibly pursuing different treatment options if she is a candidate.  Would she qualify for either of these devices? Thank you, Shanda Bumps

## 2020-02-23 NOTE — Addendum Note (Signed)
Addended by: Raliegh Ip on: 02/23/2020 04:55 PM   Modules accepted: Orders

## 2020-02-23 NOTE — Telephone Encounter (Signed)
You can refer her to dentistry for evaluation of a dental device.

## 2020-02-24 NOTE — Telephone Encounter (Signed)
I reached out to the pt about her questions in regards to the dental device.  Pt sts she is going to take a couple of days to consider her options and will call us back.

## 2020-02-26 NOTE — Telephone Encounter (Signed)
Pt came by today stating that she would like to go ahead with the CPAP machine. Best call back # is [phone].

## 2020-03-02 NOTE — Telephone Encounter (Signed)
Pt called office. She has elected to proceed with a cpap instead of a dental device. Her current cpap is broken and greater than 77 years old. I explained that if she wants a new cpap we can send the order for it to [facility] and they will contact her within a week or two to discuss insurance coverage, etc. A follow up appt was made for insurance purposes on 06/23/2020 at 3:45pm with Shanda Bumps, NP. Pt verbalized understanding of the recommendations.

## 2020-03-02 NOTE — Telephone Encounter (Signed)
Brown, Jessica  Rooney Swails S, RN Thank you, will process.   

## 2020-03-02 NOTE — Addendum Note (Signed)
Addended by: Raliegh Ip on: 03/02/2020 11:49 AM   Modules accepted: Orders

## 2020-03-02 NOTE — Telephone Encounter (Signed)
Order placed.  She will need a follow-up 3 to 6 months after initiating with new machine for CPAP compliance visit

## 2020-03-02 NOTE — Addendum Note (Signed)
Addended by: Guy Begin on: 03/02/2020 11:17 AM   Modules accepted: Orders

## 2020-03-03 DIAGNOSIS — G14 Postpolio syndrome: Secondary | ICD-10-CM | POA: Diagnosis not present

## 2020-03-03 DIAGNOSIS — R29898 Other symptoms and signs involving the musculoskeletal system: Secondary | ICD-10-CM | POA: Diagnosis not present

## 2020-03-04 DIAGNOSIS — G14 Postpolio syndrome: Secondary | ICD-10-CM | POA: Diagnosis not present

## 2020-03-04 DIAGNOSIS — G4733 Obstructive sleep apnea (adult) (pediatric): Secondary | ICD-10-CM | POA: Diagnosis not present

## 2020-03-04 DIAGNOSIS — M797 Fibromyalgia: Secondary | ICD-10-CM | POA: Diagnosis not present

## 2020-03-12 NOTE — Telephone Encounter (Signed)
Pt called stating that she has not heard from [facility] and when she called this morning they informed her that they had not received the order. Please advise.

## 2020-03-15 NOTE — Telephone Encounter (Signed)
Pt called and was informed that a new CPAP order has been sent in. Pt will call [facility] to schedule pick up. (Noted by Starr Sinclair.)

## 2020-03-15 NOTE — Telephone Encounter (Signed)
I sent CM for new cpap order for pt to [facility], CS/JB.

## 2020-03-16 NOTE — Telephone Encounter (Signed)
I sent community message to see if they could see in Epic.  But did end up faxing to # listed with confirmation received.

## 2020-03-16 NOTE — Telephone Encounter (Signed)
Pt has called to inform that she has spoken with [facility] and they have yet to receive the order for the new CPAP.  Pt spoke with someone and was given the fax#[phone] to ask the order be sent there.

## 2020-03-17 NOTE — Telephone Encounter (Signed)
Kathee Delton, RN No, I have this order from when you sent it back on 06/29. We are currently around 10 business days out on processing orders. I'm sorry for the delay, we've been extremely short staffed and are doing our best to get caught up. I'm working her up right now.

## 2020-03-22 NOTE — Telephone Encounter (Signed)
RE: cpap Received: Today Cherylin Mylar, RN; Gala Lewandowsky I spoke to patient this morning @ 9:15 am - she is scheduled for 04/05/20 @ 1:30 pm   Thanks!  Trula Ore

## 2020-04-04 DIAGNOSIS — G14 Postpolio syndrome: Secondary | ICD-10-CM | POA: Diagnosis not present

## 2020-04-04 DIAGNOSIS — G4733 Obstructive sleep apnea (adult) (pediatric): Secondary | ICD-10-CM | POA: Diagnosis not present

## 2020-04-04 DIAGNOSIS — M797 Fibromyalgia: Secondary | ICD-10-CM | POA: Diagnosis not present

## 2020-04-09 DIAGNOSIS — G4733 Obstructive sleep apnea (adult) (pediatric): Secondary | ICD-10-CM | POA: Diagnosis not present

## 2020-04-13 DIAGNOSIS — G4733 Obstructive sleep apnea (adult) (pediatric): Secondary | ICD-10-CM | POA: Diagnosis not present

## 2020-05-05 DIAGNOSIS — G14 Postpolio syndrome: Secondary | ICD-10-CM | POA: Diagnosis not present

## 2020-05-05 DIAGNOSIS — M797 Fibromyalgia: Secondary | ICD-10-CM | POA: Diagnosis not present

## 2020-05-05 DIAGNOSIS — G4733 Obstructive sleep apnea (adult) (pediatric): Secondary | ICD-10-CM | POA: Diagnosis not present

## 2020-05-10 DIAGNOSIS — G4733 Obstructive sleep apnea (adult) (pediatric): Secondary | ICD-10-CM | POA: Diagnosis not present

## 2020-06-04 DIAGNOSIS — G4733 Obstructive sleep apnea (adult) (pediatric): Secondary | ICD-10-CM | POA: Diagnosis not present

## 2020-06-04 DIAGNOSIS — G14 Postpolio syndrome: Secondary | ICD-10-CM | POA: Diagnosis not present

## 2020-06-04 DIAGNOSIS — M797 Fibromyalgia: Secondary | ICD-10-CM | POA: Diagnosis not present

## 2020-06-09 DIAGNOSIS — G4733 Obstructive sleep apnea (adult) (pediatric): Secondary | ICD-10-CM | POA: Diagnosis not present

## 2020-06-10 ENCOUNTER — Emergency Department (HOSPITAL_BASED_OUTPATIENT_CLINIC_OR_DEPARTMENT_OTHER): Payer: Medicare PPO

## 2020-06-10 ENCOUNTER — Emergency Department (HOSPITAL_BASED_OUTPATIENT_CLINIC_OR_DEPARTMENT_OTHER)
Admission: EM | Admit: 2020-06-10 | Discharge: 2020-06-11 | Disposition: A | Discharge status: Home / Self Care | Payer: Medicare PPO | Attending: Emergency Medicine | Admitting: Emergency Medicine

## 2020-06-10 ENCOUNTER — Encounter (HOSPITAL_BASED_OUTPATIENT_CLINIC_OR_DEPARTMENT_OTHER): Payer: Self-pay | Admitting: *Deleted

## 2020-06-10 ENCOUNTER — Other Ambulatory Visit: Payer: Self-pay

## 2020-06-10 DIAGNOSIS — S42292A Other displaced fracture of upper end of left humerus, initial encounter for closed fracture: Secondary | ICD-10-CM | POA: Diagnosis not present

## 2020-06-10 DIAGNOSIS — Z79899 Other long term (current) drug therapy: Secondary | ICD-10-CM | POA: Insufficient documentation

## 2020-06-10 DIAGNOSIS — I10 Essential (primary) hypertension: Secondary | ICD-10-CM | POA: Insufficient documentation

## 2020-06-10 DIAGNOSIS — Y92018 Other place in single-family (private) house as the place of occurrence of the external cause: Secondary | ICD-10-CM | POA: Diagnosis not present

## 2020-06-10 DIAGNOSIS — S42212A Unspecified displaced fracture of surgical neck of left humerus, initial encounter for closed fracture: Secondary | ICD-10-CM | POA: Diagnosis not present

## 2020-06-10 DIAGNOSIS — S022XXA Fracture of nasal bones, initial encounter for closed fracture: Secondary | ICD-10-CM | POA: Insufficient documentation

## 2020-06-10 DIAGNOSIS — W010XXA Fall on same level from slipping, tripping and stumbling without subsequent striking against object, initial encounter: Secondary | ICD-10-CM | POA: Diagnosis not present

## 2020-06-10 DIAGNOSIS — S0993XA Unspecified injury of face, initial encounter: Secondary | ICD-10-CM | POA: Diagnosis not present

## 2020-06-10 DIAGNOSIS — S0990XA Unspecified injury of head, initial encounter: Secondary | ICD-10-CM | POA: Diagnosis present

## 2020-06-10 DIAGNOSIS — S0083XA Contusion of other part of head, initial encounter: Secondary | ICD-10-CM | POA: Diagnosis not present

## 2020-06-10 DIAGNOSIS — Y9301 Activity, walking, marching and hiking: Secondary | ICD-10-CM | POA: Insufficient documentation

## 2020-06-10 DIAGNOSIS — I7090 Unspecified atherosclerosis: Secondary | ICD-10-CM | POA: Diagnosis not present

## 2020-06-10 DIAGNOSIS — S42332A Displaced oblique fracture of shaft of humerus, left arm, initial encounter for closed fracture: Secondary | ICD-10-CM | POA: Diagnosis not present

## 2020-06-10 DIAGNOSIS — M79602 Pain in left arm: Secondary | ICD-10-CM | POA: Diagnosis not present

## 2020-06-10 DIAGNOSIS — W19XXXA Unspecified fall, initial encounter: Secondary | ICD-10-CM

## 2020-06-10 DIAGNOSIS — H748X2 Other specified disorders of left middle ear and mastoid: Secondary | ICD-10-CM | POA: Diagnosis not present

## 2020-06-10 MED ORDER — FENTANYL CITRATE (PF) 100 MCG/2ML IJ SOLN
50.0000 ug | Freq: Once | INTRAMUSCULAR | Status: AC
  Administered 2020-06-10: 50 ug via INTRAVENOUS
  Filled 2020-06-10: qty 2

## 2020-06-10 MED ORDER — HYDROMORPHONE HCL 2 MG PO TABS: 2.0000 mg | ORAL_TABLET | ORAL | 0 refills | Status: DC | PRN

## 2020-06-10 NOTE — ED Provider Notes (Signed)
Medical screening examination/treatment/procedure(s) were conducted as a shared visit with non-physician practitioner(s) and myself.  I personally evaluated the patient during the encounter.      Patient seen by me along with physician assistant. Patient had a fall where she sort of the face plant hit the anterior part of her forehead. Is a very small laceration to the bridge of the nose and questionable deformity. There is a hematoma to the forehead area patient also has main complaint is left shoulder pain. More so the left arm. No loss of consciousness. Patient is been up on her feet no lower extremity pain no hip pain. Patient has a history of some chronic back pain for which she is on tramadol.  CT head confirmed the nasal bone fractures. Will need follow-up with ear nose and throat for this. No active nose bleeding at this time. No evidence of any orbital fractures. X-ray of the left upper extremity shows a humerus fracture. Humerus fracture was discussed with Dr. Turner Daniels from [facility]. Patient can be treated with a shoulder immobilizer. And they will follow her up in the office. Distally patient has good radial pulse and good movement of her fingers and sensations intact. No evidence of any other significant injuries  Patient has an allergy to codeine. Which she thinks also may be the synthetic codeine's. She is treated with tramadol at home. We will give her some hydromorphone oral that she can take in place of the tramadol if it is not strong enough for the pain.   Vanetta Mulders, MD 06/10/20 [phone]

## 2020-06-10 NOTE — ED Triage Notes (Signed)
Pt tripped and fell and struck her face.  Hematoma to nose and forehead.  Severe pain to left shoulder and upper arm.  Pt denies any LOC.

## 2020-06-10 NOTE — ED Notes (Signed)
DENIES TINGLING OR NUMBNESS OF LEFT DIGITS, LEFT HAND GRIP IS STRONG

## 2020-06-10 NOTE — ED Provider Notes (Signed)
[facility] EMERGENCY DEPARTMENT Provider Note   CSN: [id number] Arrival date & time: 06/10/20  2011     History Chief Complaint  Patient presents with  . Fall    Erica Davidson is a 79 y.o. female who presents following a mechanical fall at a [employer] store earlier this evening.  She presents via EMS.  She states that she was walking when she tripped on a rug in the entryway.  She states she fell face forward flat on her face on the tile floor.  She had immediate severe pain in her left arm, significant amount of bleeding from her nose.  She denies loss of consciousness, nausea, vomiting, blurry/ double vision.  She denies pain in her neck.  Or difficulty moving her neck.  Denies numbness or tingling in her extremities.  Denies any chest pain, shortness of breath, dizziness, lightheadedness, confusion. She is not on any anticoagulation medication.  I have personally reviewed this patient's medical record.  She is treated for post polio syndrome, struct of sleep apnea, hypertension, GERD, osteoporosis, hypothyroidism.   Patient's husband at the bedside.  HPI     Past Medical History:  Diagnosis Date  . Arthritis   . Blood transfusion without reported diagnosis   . Cataract   . Colitis   . Depression   . Diverticulosis   . Encounter for Zostavax administration 02/10/2008  . Fibromyalgia   . GERD (gastroesophageal reflux disease)   . Hemorrhoids, internal   . Hypertension   . Immunization, tetanus-diphtheria 10/2002  . Neuromuscular disorder (HCC)   . Pneumococcal vaccination given 01/2009  . Polio   . Ulcer     Patient Active Problem List   Diagnosis Date Noted  . POST-POLIO SYNDROME 10/07/2009  . OBSTRUCTIVE SLEEP APNEA 10/07/2009  . Essential hypertension 10/07/2009  . G E R D 10/07/2009  . OSTEOPOROSIS 10/07/2009  . COUGH 10/07/2009    Past Surgical History:  Procedure Laterality Date  . ABDOMINAL HYSTERECTOMY    . APPENDECTOMY    .  CHOLECYSTECTOMY    . EYE SURGERY       OB History   No obstetric history on file.     Family History  Problem Relation Age of Onset  . Rheum arthritis Mother   . Liver disease Father   . Cancer Sister        Lung    Social History   Tobacco Use  . Smoking status: Never Smoker  . Smokeless tobacco: Never Used  Vaping Use  . Vaping Use: Never used  Substance Use Topics  . Alcohol use: No  . Drug use: No    Home Medications Prior to Admission medications   Medication Sig Start Date End Date Taking? Authorizing Provider  dexlansoprazole (DEXILANT) 60 MG capsule Take 1 capsule (60 mg total) by mouth daily. 01/04/16   Kozlow, Alvira Philips, MD  hydrochlorothiazide (MICROZIDE) 12.5 MG capsule Take 1 capsule (12.5 mg total) by mouth daily. 09/02/19 12/01/19  Lewayne Bunting, MD  levothyroxine (SYNTHROID) 137 MCG tablet Take 137 mcg by mouth daily before breakfast.    [provider]  midodrine (PROAMATINE) 2.5 MG tablet Take 1 tablet by mouth every 4 hours while awake if your systolic blood pressure is below 110 07/03/17   Duke Salvia, MD  MYRBETRIQ 25 MG TB24 tablet  02/11/20   [provider]  RESTASIS 0.05 % ophthalmic emulsion 1 drop 2 (two) times daily. 12/23/19   [provider]  sertraline (  ZOLOFT) 50 MG tablet Take 50 mg by mouth daily.    [provider]  traMADol Janean Sark) 50 MG tablet  11/26/19   [provider]    Allergies    Zithromax [azithromycin], Ciprofloxacin, Loratadine-pseudoephedrine er, Penicillins, Prednisone, and Codeine  Review of Systems   Review of Systems  Constitutional: Negative.   HENT: Positive for facial swelling and nosebleeds.   Eyes: Negative for visual disturbance.  Respiratory: Negative.   Cardiovascular: Negative.   Gastrointestinal: Negative.   Genitourinary: Negative.   Musculoskeletal: Negative for neck pain and neck stiffness.       Left arm pain  Skin: Positive for wound.       nose   Neurological: Positive for headaches. Negative for dizziness, syncope, weakness and light-headedness.  Psychiatric/Behavioral: Negative.     Physical Exam Updated Vital Signs BP (!) 197/104 (BP Location: Right Arm)   Pulse 74   Temp 98 F (36.7 C) (Oral)   Resp (!) 24   Wt 64 kg   SpO2 95%   BMI 24.98 kg/m   Physical Exam Vitals and nursing note reviewed.  HENT:     Head: Raccoon eyes and laceration present.      Comments: Ecchymosis below both eyes.    Right Ear: Tympanic membrane and ear canal normal. No hemotympanum.     Left Ear: Tympanic membrane and ear canal normal. No hemotympanum.     Nose: Nasal deformity present. No septal deviation or congestion.     Right Nostril: No epistaxis, septal hematoma or occlusion.     Left Nostril: No epistaxis, septal hematoma or occlusion.      Mouth/Throat:     Mouth: Mucous membranes are moist. No oral lesions.     Tongue: No lesions.     Pharynx: Oropharynx is clear. Uvula midline. No oropharyngeal exudate or posterior oropharyngeal erythema.     Comments: Dried blood Eyes:     General: Lids are normal. Vision grossly intact. Gaze aligned appropriately. No visual field deficit.       Right eye: No discharge.        Left eye: No discharge.     Extraocular Movements: Extraocular movements intact.     Conjunctiva/sclera: Conjunctivae normal.     Pupils: Pupils are equal, round, and reactive to light.  Neck:     Trachea: Trachea normal.  Cardiovascular:     Rate and Rhythm: Normal rate and regular rhythm.     Pulses: Normal pulses.     Heart sounds: Normal heart sounds. No murmur heard.   Pulmonary:     Effort: Pulmonary effort is normal. No respiratory distress.     Breath sounds: Normal breath sounds. No wheezing or rales.  Chest:     Chest wall: No deformity, swelling, tenderness or edema.  Abdominal:     General: There is no distension.     Palpations: Abdomen is soft.     Tenderness: There is no abdominal  tenderness.  Musculoskeletal:        General: No deformity.       Arms:     Cervical back: Full passive range of motion without pain and neck supple. No edema. No pain with movement, spinous process tenderness or muscular tenderness.     Right lower leg: No edema.     Left lower leg: No edema.     Comments: Sensation intact in left upper extremity, 2+ radial pulses bilaterally. 5/5 grip strength bilaterally.  Skin:    General: Skin  is warm and dry.     Capillary Refill: Capillary refill takes less than 2 seconds.     Findings: Abrasion and ecchymosis present.  Neurological:     General: No focal deficit present.     Mental Status: She is alert and oriented to person, place, and time. Mental status is at baseline.     Cranial Nerves: Cranial nerves are intact. No cranial nerve deficit.     Sensory: Sensation is intact. No sensory deficit.     Motor: Motor function is intact. No weakness.     Coordination: Coordination is intact.  Psychiatric:        Mood and Affect: Mood normal.     ED Results / Procedures / Treatments   Labs (all labs ordered are listed, but only abnormal results are displayed) Labs Reviewed - No data to display  EKG None  Radiology CT Head Wo Contrast  Result Date: 06/10/2020 CLINICAL DATA:  Fall with facial trauma. EXAM: CT HEAD WITHOUT CONTRAST TECHNIQUE: Contiguous axial images were obtained from the base of the skull through the vertex without intravenous contrast. COMPARISON:  12/25/2017 FINDINGS: Brain: There is no evidence of an acute infarct, intracranial hemorrhage, mass, midline shift, or extra-axial fluid collection. The ventricles and sulci are within normal limits for age. Vascular: Mild calcified atherosclerosis at the skull base. No hyperdense vessel. Skull: Mildly displaced nasal bone fractures. Sinuses/Orbits: Trace chronic left mastoid effusion. Clear paranasal sinuses. Chronic rightward nasal septal deviation. Bilateral cataract extraction.  Other: Moderate-sized forehead hematoma. IMPRESSION: 1. No evidence of acute intracranial abnormality. 2. Mildly displaced nasal bone fractures. 3. Forehead hematoma. Electronically Signed   By: Sebastian AcheAllen  Grady M.D.   On: 06/10/2020 21:19   DG Humerus Left  Result Date: 06/10/2020 CLINICAL DATA:  Fall.  Left upper arm pain. EXAM: LEFT HUMERUS - 2+ VIEW COMPARISON:  Left shoulder radiographs 07/31/2018 FINDINGS: There is an oblique, mildly displaced fracture of the proximal humeral shaft. The fracture extends into the humeral neck and head without significant associated displacement. The humeral head remains approximated with the glenoid. The distal humerus appears intact. IMPRESSION: Mildly displaced proximal humerus fracture. Electronically Signed   By: Sebastian AcheAllen  Grady M.D.   On: 06/10/2020 21:24    Procedures Procedures (including critical care time)  Medications Ordered in ED Medications  fentaNYL (SUBLIMAZE) injection 50 mcg (50 mcg Intravenous Given 06/10/20 2048)    ED Course  I have reviewed the triage vital signs and the nursing notes.  Pertinent labs & imaging results that were available during my care of the patient were reviewed by me and considered in my medical decision making (see chart for details).    MDM Rules/Calculators/A&P                         Concern for intracranial hemorrhage, skull fracture, facial fracture in this elderly fall patient. Patient is not on anticoagulation.  I saw this patient in triage. She is tearful and in pain. 50 mcg of fentanyl administered for pain prior to transport for CT, x-rays.  Vital signs on intake unremarkable aside from blood pressure of 184/83.   CT head negative for acute intracranial abnormality. + Mildly displaced nasal bone fractures, large frontal hematoma.   X-ray left humerus with mildly displaced left proximal humerus fracture.  Time of my full initial evaluation of the patient, she is resting uncomfortably in bed. States her  pain is improved initially after fentanyl, but now an hour later  her left arm pain is back to its baseline. Fentanyl 50 mcg ordered again. Reassuring neurologic exam.  Small laceration on bridge of nose, will heal on its own. Dressed with antibiotic ointment and clean gauze. Patient's husband was at the bedside was educated on how to do dressing change.  Care of this patient was staffed with attending physician Dr. Deretha Emory. Shoulder immobilizer ordered by attending physician.  Given reassuring neurologic exam, CT of the head, and clear etiology for left arm pain on plain film I do not feel any further work-up in the emergency department is necessary at this time. Patient's vital signs have remained stable, hypertension is improved to 166/77.   I explained imaging results to both the patient and her husband at the bedside. They voiced understanding of her findings, and our treatment plan. Their questions were answered to her expressed satisfaction. Patient is amenable to discharge at this time.   We will have her follow-up with orthopedics ASAP. She is already under the care of Dr. Yevette Edwards at [facility] orthopedics. We will have the patient follow-up with otolaryngology for further evaluation and definitive management of her displaced nasal fractures.  Patient with allergy to codeine. Will prescribe low-dose Dilaudid p.o. at home for pain. Patient should not to take her home tramadol while she is taking Dilaudid.  Return precautions were given. Patient is stable for discharge at this time. She may go home as soon as she has been placed in a shoulder immobilizer.   Final Clinical Impression(s) / ED Diagnoses Final diagnoses:  None    Rx / DC Orders ED Discharge Orders    None       Paris Lore, PA-C 06/11/20 0027    Vanetta Mulders, MD 06/11/20 1523

## 2020-06-11 DIAGNOSIS — S42335A Nondisplaced oblique fracture of shaft of humerus, left arm, initial encounter for closed fracture: Secondary | ICD-10-CM | POA: Diagnosis not present

## 2020-06-11 DIAGNOSIS — S42302A Unspecified fracture of shaft of humerus, left arm, initial encounter for closed fracture: Secondary | ICD-10-CM | POA: Diagnosis not present

## 2020-06-11 NOTE — Discharge Instructions (Signed)
Were seen today in the emergency department following your fall this evening. Imaging of your head and face did not reveal any fractures or bleeding in your brain.  This is very good news!  Unfortunately x-rays of your left shoulder and arm revealed a break in your upper arm, called your humerus bone.  You have been placed in a shoulder immobilizer.  This should help limit your pain when you have to move about doing her normal daily activities.    Above is the information for your orthopedic doctor.  Please call them first thing tomorrow morning to schedule an appointment for follow-up for humeral fracture, which was diagnosed in the emergency department.  You have been prescribed an oral narcotic pain medication called Dilaudid.  It is important that you do not drive or operate any out of heavy machinery while taking this medication, as it can make you very sleepy.  You may also utilize over-the-counter ibuprofen or Tylenol for pain management.  You have informed me that you have tramadol at home.  You may take tramadol tonight only prior to picking up her Dilaudid prescription.  Do not take Dilaudid and tramadol at the same time, as they are in the the same drug class and you could potentially have complications from overdose on narcotic pain medication if you take them together.  Additionally you have a broken nose, above is information to contact the ear nose and throat doctors.  Please call them to schedule appointment for evaluation and definitive management of your broken nose.  The laceration that you have in the bridge of your nose should heal on its own.  Please keep it covered with antibiotic ointment and clean gauze.  You should change the dressing at least once a day, or anytime the gauze gets soaked through with blood, or gets wet.  Please return immediately to emergency department should he develop any alteration in mental status, dizziness, lightheadedness, blurry or double vision, or any  new severe symptoms.

## 2020-06-15 ENCOUNTER — Telehealth: Payer: Self-pay | Admitting: Adult Health

## 2020-06-15 NOTE — Telephone Encounter (Signed)
[name] and  Pt was included on conference call, Pt fell and can not use her CPAP machine. When submitting compliance form make an adjustment, can not use CPAP machine due to broken nose and arm. Pt ask if she needed to cancel her appt. I Erica Davidson) inform Pt to cancel and call back to when you can use your CPAP machine to schedule appt.

## 2020-06-16 NOTE — Telephone Encounter (Signed)
Noted! Thank you

## 2020-06-16 NOTE — Telephone Encounter (Signed)
Sent community message to [employer] as FYI.Marland Kitchen

## 2020-06-17 NOTE — Telephone Encounter (Signed)
Kathee Delton, RN Noted, thank you.

## 2020-06-22 DIAGNOSIS — S022XXD Fracture of nasal bones, subsequent encounter for fracture with routine healing: Secondary | ICD-10-CM | POA: Diagnosis not present

## 2020-06-22 DIAGNOSIS — S022XXA Fracture of nasal bones, initial encounter for closed fracture: Secondary | ICD-10-CM | POA: Insufficient documentation

## 2020-06-23 ENCOUNTER — Ambulatory Visit: Payer: Self-pay | Admitting: Adult Health

## 2020-06-24 DIAGNOSIS — R609 Edema, unspecified: Secondary | ICD-10-CM | POA: Diagnosis not present

## 2020-06-25 DIAGNOSIS — S42335A Nondisplaced oblique fracture of shaft of humerus, left arm, initial encounter for closed fracture: Secondary | ICD-10-CM | POA: Diagnosis not present

## 2020-07-02 DIAGNOSIS — Z23 Encounter for immunization: Secondary | ICD-10-CM | POA: Diagnosis not present

## 2020-07-05 DIAGNOSIS — G14 Postpolio syndrome: Secondary | ICD-10-CM | POA: Diagnosis not present

## 2020-07-05 DIAGNOSIS — G4733 Obstructive sleep apnea (adult) (pediatric): Secondary | ICD-10-CM | POA: Diagnosis not present

## 2020-07-05 DIAGNOSIS — M797 Fibromyalgia: Secondary | ICD-10-CM | POA: Diagnosis not present

## 2020-07-08 DIAGNOSIS — K5901 Slow transit constipation: Secondary | ICD-10-CM | POA: Diagnosis not present

## 2020-07-08 DIAGNOSIS — R110 Nausea: Secondary | ICD-10-CM | POA: Diagnosis not present

## 2020-07-09 DIAGNOSIS — S42335A Nondisplaced oblique fracture of shaft of humerus, left arm, initial encounter for closed fracture: Secondary | ICD-10-CM | POA: Diagnosis not present

## 2020-07-10 DIAGNOSIS — G4733 Obstructive sleep apnea (adult) (pediatric): Secondary | ICD-10-CM | POA: Diagnosis not present

## 2020-07-13 NOTE — Progress Notes (Signed)
HPI: Follow-up hypertension. Previously followed by Dr. Graciela Husbands. Apparently had previous evaluation to exclude hyperaldosteronism and pheochromocytoma. Echocardiogram October 2017 showed normal LV function. Cardiac CTA November 2018 showed calcium score of 29, less than 30% left main, less than 30% LAD. Aortic root mildly dilated at 38 mm. Renal Dopplers November 2018 showed no renal artery stenosis.  Losartan discontinued at previous office visit.  Since last seen,  chest pain, palpitations or syncope.  She continues to have occasional "weak spells" with blood pressure dropping to systolic of 105-110.  At other times her blood pressure is 140/80.  She recently fell injuring her left upper extremity and it has been somewhat higher since then.  Current Outpatient Medications  Medication Sig Dispense Refill  . dexlansoprazole (DEXILANT) 60 MG capsule Take 1 capsule (60 mg total) by mouth daily. 90 capsule 3  . hydrochlorothiazide (MICROZIDE) 12.5 MG capsule Take 1 capsule (12.5 mg total) by mouth daily. 90 capsule 3  . HYDROmorphone (DILAUDID) 2 MG tablet Take 1 tablet (2 mg total) by mouth every 4 (four) hours as needed for severe pain. 10 tablet 0  . levothyroxine (SYNTHROID) 137 MCG tablet Take 137 mcg by mouth daily before breakfast.    . midodrine (PROAMATINE) 2.5 MG tablet Take 1 tablet by mouth every 4 hours while awake if your systolic blood pressure is below 110 90 tablet 1  . MYRBETRIQ 25 MG TB24 tablet     . RESTASIS 0.05 % ophthalmic emulsion 1 drop 2 (two) times daily.    . sertraline (ZOLOFT) 50 MG tablet Take 50 mg by mouth daily.    . traMADol (ULTRAM) 50 MG tablet      No current facility-administered medications for this visit.     Past Medical History:  Diagnosis Date  . Arthritis   . Blood transfusion without reported diagnosis   . Cataract   . Colitis   . Depression   . Diverticulosis   . Encounter for Zostavax administration 02/10/2008  . Fibromyalgia   . GERD  (gastroesophageal reflux disease)   . Hemorrhoids, internal   . Hypertension   . Immunization, tetanus-diphtheria 10/2002  . Neuromuscular disorder (HCC)   . Pneumococcal vaccination given 01/2009  . Polio   . Ulcer     Past Surgical History:  Procedure Laterality Date  . ABDOMINAL HYSTERECTOMY    . APPENDECTOMY    . CHOLECYSTECTOMY    . EYE SURGERY      Social History   Socioeconomic History  . Marital status: Married    Spouse name: Starleen Blue  . Number of children: 2  . Years of education: College  . Highest education level: Not on file  Occupational History  . Occupation: Retired Runner, broadcasting/film/video  Tobacco Use  . Smoking status: Never Smoker  . Smokeless tobacco: Never Used  Vaping Use  . Vaping Use: Never used  Substance and Sexual Activity  . Alcohol use: No  . Drug use: No  . Sexual activity: Yes  Other Topics Concern  . Not on file  Social History Narrative   Pt lives at home with spouse in a one story home.  Has one daughter.  Retired Runner, broadcasting/film/video.  Education: college.    Caffeine Use: quit 09/2012   Patient is right handed   Social Determinants of Health   Financial Resource Strain:   . Difficulty of Paying Living Expenses: Not on file  Food Insecurity:   . Worried About Programme researcher, broadcasting/film/video in the  Last Year: Not on file  . Ran Out of Food in the Last Year: Not on file  Transportation Needs:   . Lack of Transportation (Medical): Not on file  . Lack of Transportation (Non-Medical): Not on file  Physical Activity:   . Days of Exercise per Week: Not on file  . Minutes of Exercise per Session: Not on file  Stress:   . Feeling of Stress : Not on file  Social Connections:   . Frequency of Communication with Friends and Family: Not on file  . Frequency of Social Gatherings with Friends and Family: Not on file  . Attends Religious Services: Not on file  . Active Member of Clubs or Organizations: Not on file  . Attends Banker Meetings: Not on file  .  Marital Status: Not on file  Intimate Partner Violence:   . Fear of Current or Ex-Partner: Not on file  . Emotionally Abused: Not on file  . Physically Abused: Not on file  . Sexually Abused: Not on file    Family History  Problem Relation Age of Onset  . Rheum arthritis Mother   . Liver disease Father   . Cancer Sister        Lung    ROS: no fevers or chills, productive cough, hemoptysis, dysphasia, odynophagia, melena, hematochezia, dysuria, hematuria, rash, seizure activity, orthopnea, PND, pedal edema, claudication. Remaining systems are negative.  Physical Exam: Well-developed well-nourished in no acute distress.  Skin is warm and dry.  HEENT is normal.  Neck is supple.  Chest is clear to auscultation with normal expansion.  Cardiovascular exam is regular rate and rhythm.  Abdominal exam nontender or distended. No masses palpated. Extremities show no edema. neuro grossly intact  ECG-normal sinus rhythm at a rate of 75, no ST changes.  Personally reviewed  A/P  1 labile hypertension-hydrochlorothiazide was previously discontinued as she felt it was contributing to low blood pressure at times.  However her edema worsened and we resumed at last office visit.  We discontinued losartan previously as she stated she was having periods of low blood pressure.  She states her blood pressure typically runs 140/80.  She continues to have periods of low blood pressure when she feels weak.  These are much less frequent since we discontinued losartan.  She will take losartan if her systolic is above 150.  Otherwise we will continue present medications.  2 history of chest pain-patient has had no recurrences.  Previous CTA showed nonobstructive coronary disease.  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  We will add aspirin 81 mg daily and Crestor 20 mg daily.  Check lipids and liver in 12 weeks.  Olga Millers, MD

## 2020-07-14 DIAGNOSIS — R1312 Dysphagia, oropharyngeal phase: Secondary | ICD-10-CM | POA: Diagnosis not present

## 2020-07-14 DIAGNOSIS — K5901 Slow transit constipation: Secondary | ICD-10-CM | POA: Diagnosis not present

## 2020-07-14 DIAGNOSIS — R634 Abnormal weight loss: Secondary | ICD-10-CM | POA: Diagnosis not present

## 2020-07-14 DIAGNOSIS — R110 Nausea: Secondary | ICD-10-CM | POA: Diagnosis not present

## 2020-07-14 DIAGNOSIS — R350 Frequency of micturition: Secondary | ICD-10-CM | POA: Diagnosis not present

## 2020-07-19 ENCOUNTER — Encounter: Payer: Self-pay | Admitting: Cardiology

## 2020-07-19 ENCOUNTER — Ambulatory Visit: Payer: Medicare PPO | Admitting: Cardiology

## 2020-07-19 VITALS — BP 150/100 | HR 75 | Ht 63.0 in | Wt 140.0 lb

## 2020-07-19 DIAGNOSIS — I10 Essential (primary) hypertension: Secondary | ICD-10-CM

## 2020-07-19 DIAGNOSIS — I2510 Atherosclerotic heart disease of native coronary artery without angina pectoris: Secondary | ICD-10-CM

## 2020-07-19 DIAGNOSIS — R072 Precordial pain: Secondary | ICD-10-CM | POA: Diagnosis not present

## 2020-07-19 MED ORDER — ASPIRIN EC 81 MG PO TBEC: 81.0000 mg | DELAYED_RELEASE_TABLET | Freq: Every day | ORAL | 3 refills | Status: DC

## 2020-07-19 MED ORDER — ROSUVASTATIN CALCIUM 20 MG PO TABS: 20.0000 mg | ORAL_TABLET | Freq: Every day | ORAL | 3 refills | Status: DC

## 2020-07-19 NOTE — Patient Instructions (Addendum)
START ASPIRIN 81 MG ONCE DAILY  START ROSUVASTATIN 20 MG ONCE DAILY  Your physician recommends that you return for lab work in: 12 WEEKS=FASTING   Follow-Up: At [facility], you and your health needs are our priority.  As part of our continuing mission to provide you with exceptional heart care, we have created designated Provider Care Teams.  These Care Teams include your primary Cardiologist (physician) and Advanced Practice Providers (APPs -  Physician Assistants and Nurse Practitioners) who all work together to provide you with the care you need, when you need it.  We recommend signing up for the patient portal called "MyChart".  Sign up information is provided on this After Visit Summary.  MyChart is used to connect with patients for Virtual Visits (Telemedicine).  Patients are able to view lab/test results, encounter notes, upcoming appointments, etc.  Non-urgent messages can be sent to your provider as well.   To learn more about what you can do with MyChart, go to [email or web address].    Your next appointment:   6 month(s)  The format for your next appointment:   In Person  Provider:   Olga Millers, MD

## 2020-08-04 DIAGNOSIS — G4733 Obstructive sleep apnea (adult) (pediatric): Secondary | ICD-10-CM | POA: Diagnosis not present

## 2020-08-04 DIAGNOSIS — G14 Postpolio syndrome: Secondary | ICD-10-CM | POA: Diagnosis not present

## 2020-08-04 DIAGNOSIS — M797 Fibromyalgia: Secondary | ICD-10-CM | POA: Diagnosis not present

## 2020-08-09 DIAGNOSIS — G4733 Obstructive sleep apnea (adult) (pediatric): Secondary | ICD-10-CM | POA: Diagnosis not present

## 2020-08-13 DIAGNOSIS — S42335A Nondisplaced oblique fracture of shaft of humerus, left arm, initial encounter for closed fracture: Secondary | ICD-10-CM | POA: Diagnosis not present

## 2020-08-30 DIAGNOSIS — S42355D Nondisplaced comminuted fracture of shaft of humerus, left arm, subsequent encounter for fracture with routine healing: Secondary | ICD-10-CM | POA: Diagnosis not present

## 2020-08-30 DIAGNOSIS — M25612 Stiffness of left shoulder, not elsewhere classified: Secondary | ICD-10-CM | POA: Diagnosis not present

## 2020-09-02 DIAGNOSIS — M25612 Stiffness of left shoulder, not elsewhere classified: Secondary | ICD-10-CM | POA: Diagnosis not present

## 2020-09-02 DIAGNOSIS — S42355D Nondisplaced comminuted fracture of shaft of humerus, left arm, subsequent encounter for fracture with routine healing: Secondary | ICD-10-CM | POA: Diagnosis not present

## 2020-09-02 DIAGNOSIS — G4733 Obstructive sleep apnea (adult) (pediatric): Secondary | ICD-10-CM | POA: Diagnosis not present

## 2020-09-04 DIAGNOSIS — G4733 Obstructive sleep apnea (adult) (pediatric): Secondary | ICD-10-CM | POA: Diagnosis not present

## 2020-09-04 DIAGNOSIS — G14 Postpolio syndrome: Secondary | ICD-10-CM | POA: Diagnosis not present

## 2020-09-04 DIAGNOSIS — M797 Fibromyalgia: Secondary | ICD-10-CM | POA: Diagnosis not present

## 2020-09-08 DIAGNOSIS — M25612 Stiffness of left shoulder, not elsewhere classified: Secondary | ICD-10-CM | POA: Diagnosis not present

## 2020-09-08 DIAGNOSIS — S42355D Nondisplaced comminuted fracture of shaft of humerus, left arm, subsequent encounter for fracture with routine healing: Secondary | ICD-10-CM | POA: Diagnosis not present

## 2020-09-09 DIAGNOSIS — G4733 Obstructive sleep apnea (adult) (pediatric): Secondary | ICD-10-CM | POA: Diagnosis not present

## 2020-09-13 DIAGNOSIS — S42335A Nondisplaced oblique fracture of shaft of humerus, left arm, initial encounter for closed fracture: Secondary | ICD-10-CM | POA: Diagnosis not present

## 2020-09-14 DIAGNOSIS — M25612 Stiffness of left shoulder, not elsewhere classified: Secondary | ICD-10-CM | POA: Diagnosis not present

## 2020-09-14 DIAGNOSIS — S42335D Nondisplaced oblique fracture of shaft of humerus, left arm, subsequent encounter for fracture with routine healing: Secondary | ICD-10-CM | POA: Diagnosis not present

## 2020-09-16 DIAGNOSIS — M25612 Stiffness of left shoulder, not elsewhere classified: Secondary | ICD-10-CM | POA: Diagnosis not present

## 2020-09-16 DIAGNOSIS — S42355D Nondisplaced comminuted fracture of shaft of humerus, left arm, subsequent encounter for fracture with routine healing: Secondary | ICD-10-CM | POA: Diagnosis not present

## 2020-09-23 DIAGNOSIS — M25612 Stiffness of left shoulder, not elsewhere classified: Secondary | ICD-10-CM | POA: Diagnosis not present

## 2020-09-23 DIAGNOSIS — S42355D Nondisplaced comminuted fracture of shaft of humerus, left arm, subsequent encounter for fracture with routine healing: Secondary | ICD-10-CM | POA: Diagnosis not present

## 2020-09-28 DIAGNOSIS — M25612 Stiffness of left shoulder, not elsewhere classified: Secondary | ICD-10-CM | POA: Diagnosis not present

## 2020-09-28 DIAGNOSIS — S42355D Nondisplaced comminuted fracture of shaft of humerus, left arm, subsequent encounter for fracture with routine healing: Secondary | ICD-10-CM | POA: Diagnosis not present

## 2020-09-30 DIAGNOSIS — M25612 Stiffness of left shoulder, not elsewhere classified: Secondary | ICD-10-CM | POA: Diagnosis not present

## 2020-09-30 DIAGNOSIS — S42355D Nondisplaced comminuted fracture of shaft of humerus, left arm, subsequent encounter for fracture with routine healing: Secondary | ICD-10-CM | POA: Diagnosis not present

## 2020-10-05 DIAGNOSIS — G14 Postpolio syndrome: Secondary | ICD-10-CM | POA: Diagnosis not present

## 2020-10-05 DIAGNOSIS — M25612 Stiffness of left shoulder, not elsewhere classified: Secondary | ICD-10-CM | POA: Diagnosis not present

## 2020-10-05 DIAGNOSIS — S42355D Nondisplaced comminuted fracture of shaft of humerus, left arm, subsequent encounter for fracture with routine healing: Secondary | ICD-10-CM | POA: Diagnosis not present

## 2020-10-05 DIAGNOSIS — M797 Fibromyalgia: Secondary | ICD-10-CM | POA: Diagnosis not present

## 2020-10-05 DIAGNOSIS — G4733 Obstructive sleep apnea (adult) (pediatric): Secondary | ICD-10-CM | POA: Diagnosis not present

## 2020-10-07 DIAGNOSIS — G4733 Obstructive sleep apnea (adult) (pediatric): Secondary | ICD-10-CM | POA: Diagnosis not present

## 2020-10-10 DIAGNOSIS — G4733 Obstructive sleep apnea (adult) (pediatric): Secondary | ICD-10-CM | POA: Diagnosis not present

## 2020-10-12 ENCOUNTER — Other Ambulatory Visit: Payer: Self-pay | Admitting: Cardiology

## 2020-10-13 DIAGNOSIS — M25612 Stiffness of left shoulder, not elsewhere classified: Secondary | ICD-10-CM | POA: Diagnosis not present

## 2020-10-13 DIAGNOSIS — M1711 Unilateral primary osteoarthritis, right knee: Secondary | ICD-10-CM | POA: Diagnosis not present

## 2020-10-13 DIAGNOSIS — S42355D Nondisplaced comminuted fracture of shaft of humerus, left arm, subsequent encounter for fracture with routine healing: Secondary | ICD-10-CM | POA: Diagnosis not present

## 2020-10-27 DIAGNOSIS — I2510 Atherosclerotic heart disease of native coronary artery without angina pectoris: Secondary | ICD-10-CM | POA: Diagnosis not present

## 2020-10-27 LAB — LIPID PANEL
Chol/HDL Ratio: 2 ratio (ref 0.0–4.4)
Cholesterol, Total: 159 mg/dL (ref 100–199)
HDL: 81 mg/dL (ref >39)
LDL Chol Calc (NIH): 64 mg/dL (ref 0–99)
Triglycerides: 75 mg/dL (ref 0–149)
VLDL Cholesterol Cal: 14 mg/dL (ref 5–40)

## 2020-10-27 LAB — HEPATIC FUNCTION PANEL
ALT: 14 IU/L (ref 0–32)
AST: 18 IU/L (ref 0–40)
Albumin: 4.3 g/dL (ref 3.7–4.7)
Alkaline Phosphatase: 83 IU/L (ref 44–121)
Bilirubin Total: 0.5 mg/dL (ref 0.0–1.2)
Bilirubin, Direct: 0.16 mg/dL (ref 0.00–0.40)
Total Protein: 6.7 g/dL (ref 6.0–8.5)

## 2020-10-29 ENCOUNTER — Encounter: Payer: Self-pay | Admitting: *Deleted

## 2020-11-02 DIAGNOSIS — M797 Fibromyalgia: Secondary | ICD-10-CM | POA: Diagnosis not present

## 2020-11-02 DIAGNOSIS — G4733 Obstructive sleep apnea (adult) (pediatric): Secondary | ICD-10-CM | POA: Diagnosis not present

## 2020-11-02 DIAGNOSIS — G14 Postpolio syndrome: Secondary | ICD-10-CM | POA: Diagnosis not present

## 2020-11-07 DIAGNOSIS — G4733 Obstructive sleep apnea (adult) (pediatric): Secondary | ICD-10-CM | POA: Diagnosis not present

## 2020-12-03 DIAGNOSIS — M797 Fibromyalgia: Secondary | ICD-10-CM | POA: Diagnosis not present

## 2020-12-03 DIAGNOSIS — G14 Postpolio syndrome: Secondary | ICD-10-CM | POA: Diagnosis not present

## 2020-12-03 DIAGNOSIS — G4733 Obstructive sleep apnea (adult) (pediatric): Secondary | ICD-10-CM | POA: Diagnosis not present

## 2020-12-07 DIAGNOSIS — H353131 Nonexudative age-related macular degeneration, bilateral, early dry stage: Secondary | ICD-10-CM | POA: Diagnosis not present

## 2020-12-07 DIAGNOSIS — H26491 Other secondary cataract, right eye: Secondary | ICD-10-CM | POA: Diagnosis not present

## 2020-12-07 DIAGNOSIS — H04123 Dry eye syndrome of bilateral lacrimal glands: Secondary | ICD-10-CM | POA: Diagnosis not present

## 2020-12-07 DIAGNOSIS — H524 Presbyopia: Secondary | ICD-10-CM | POA: Diagnosis not present

## 2020-12-07 DIAGNOSIS — Z961 Presence of intraocular lens: Secondary | ICD-10-CM | POA: Diagnosis not present

## 2020-12-08 DIAGNOSIS — L570 Actinic keratosis: Secondary | ICD-10-CM | POA: Diagnosis not present

## 2020-12-08 DIAGNOSIS — D225 Melanocytic nevi of trunk: Secondary | ICD-10-CM | POA: Diagnosis not present

## 2020-12-08 DIAGNOSIS — G4733 Obstructive sleep apnea (adult) (pediatric): Secondary | ICD-10-CM | POA: Diagnosis not present

## 2020-12-08 DIAGNOSIS — L821 Other seborrheic keratosis: Secondary | ICD-10-CM | POA: Diagnosis not present

## 2020-12-15 DIAGNOSIS — M8589 Other specified disorders of bone density and structure, multiple sites: Secondary | ICD-10-CM | POA: Diagnosis not present

## 2020-12-15 DIAGNOSIS — Z1231 Encounter for screening mammogram for malignant neoplasm of breast: Secondary | ICD-10-CM | POA: Diagnosis not present

## 2020-12-15 DIAGNOSIS — Z780 Asymptomatic menopausal state: Secondary | ICD-10-CM | POA: Diagnosis not present

## 2020-12-16 DIAGNOSIS — Z79899 Other long term (current) drug therapy: Secondary | ICD-10-CM | POA: Diagnosis not present

## 2020-12-16 DIAGNOSIS — G4733 Obstructive sleep apnea (adult) (pediatric): Secondary | ICD-10-CM | POA: Diagnosis not present

## 2020-12-16 DIAGNOSIS — Z9989 Dependence on other enabling machines and devices: Secondary | ICD-10-CM | POA: Diagnosis not present

## 2020-12-16 DIAGNOSIS — I10 Essential (primary) hypertension: Secondary | ICD-10-CM | POA: Diagnosis not present

## 2020-12-16 DIAGNOSIS — E559 Vitamin D deficiency, unspecified: Secondary | ICD-10-CM | POA: Diagnosis not present

## 2020-12-16 DIAGNOSIS — Z862 Personal history of diseases of the blood and blood-forming organs and certain disorders involving the immune mechanism: Secondary | ICD-10-CM | POA: Diagnosis not present

## 2020-12-16 DIAGNOSIS — E039 Hypothyroidism, unspecified: Secondary | ICD-10-CM | POA: Diagnosis not present

## 2020-12-16 DIAGNOSIS — R531 Weakness: Secondary | ICD-10-CM | POA: Diagnosis not present

## 2020-12-16 DIAGNOSIS — G14 Postpolio syndrome: Secondary | ICD-10-CM | POA: Diagnosis not present

## 2020-12-16 DIAGNOSIS — R5383 Other fatigue: Secondary | ICD-10-CM | POA: Diagnosis not present

## 2020-12-20 DIAGNOSIS — M1711 Unilateral primary osteoarthritis, right knee: Secondary | ICD-10-CM | POA: Diagnosis not present

## 2020-12-21 NOTE — Progress Notes (Deleted)
HPI: Follow-up hypertension. Previously followed by Dr. Graciela Husbands. Apparently had previous evaluation to exclude hyperaldosteronism and pheochromocytoma. Echocardiogram October 2017 showed normal LV function. Cardiac CTA November 2018 showed calcium score of 29, less than 30% left main, less than 30% LAD. Aortic root mildly dilated at 38 mm. Renal Dopplers November 2018 showed no renal artery stenosis. Since last seen,   Current Outpatient Medications  Medication Sig Dispense Refill  . aspirin EC 81 MG tablet Take 1 tablet (81 mg total) by mouth daily. Swallow whole. 90 tablet 3  . dexlansoprazole (DEXILANT) 60 MG capsule Take 1 capsule (60 mg total) by mouth daily. 90 capsule 3  . hydrochlorothiazide (MICROZIDE) 12.5 MG capsule TAKE 1 CAPSULE BY MOUTH DAILY 90 capsule 3  . HYDROmorphone (DILAUDID) 2 MG tablet Take 1 tablet (2 mg total) by mouth every 4 (four) hours as needed for severe pain. 10 tablet 0  . levothyroxine (SYNTHROID) 137 MCG tablet Take 137 mcg by mouth daily before breakfast.    . midodrine (PROAMATINE) 2.5 MG tablet Take 1 tablet by mouth every 4 hours while awake if your systolic blood pressure is below 110 90 tablet 1  . MYRBETRIQ 25 MG TB24 tablet     . RESTASIS 0.05 % ophthalmic emulsion 1 drop 2 (two) times daily.    . rosuvastatin (CRESTOR) 20 MG tablet Take 1 tablet (20 mg total) by mouth daily. 90 tablet 3  . sertraline (ZOLOFT) 50 MG tablet Take 50 mg by mouth daily.    . traMADol (ULTRAM) 50 MG tablet      No current facility-administered medications for this visit.     Past Medical History:  Diagnosis Date  . Arthritis   . Blood transfusion without reported diagnosis   . Cataract   . Colitis   . Depression   . Diverticulosis   . Encounter for Zostavax administration 02/10/2008  . Fibromyalgia   . GERD (gastroesophageal reflux disease)   . Hemorrhoids, internal   . Hypertension   . Immunization, tetanus-diphtheria 10/2002  . Neuromuscular disorder  (HCC)   . Pneumococcal vaccination given 01/2009  . Polio   . Ulcer     Past Surgical History:  Procedure Laterality Date  . ABDOMINAL HYSTERECTOMY    . APPENDECTOMY    . CHOLECYSTECTOMY    . EYE SURGERY      Social History   Socioeconomic History  . Marital status: Married    Spouse name: Starleen Blue  . Number of children: 2  . Years of education: College  . Highest education level: Not on file  Occupational History  . Occupation: Retired Runner, broadcasting/film/video  Tobacco Use  . Smoking status: Never Smoker  . Smokeless tobacco: Never Used  Vaping Use  . Vaping Use: Never used  Substance and Sexual Activity  . Alcohol use: No  . Drug use: No  . Sexual activity: Yes  Other Topics Concern  . Not on file  Social History Narrative   Pt lives at home with spouse in a one story home.  Has one daughter.  Retired Runner, broadcasting/film/video.  Education: college.    Caffeine Use: quit 09/2012   Patient is right handed   Social Determinants of Health   Financial Resource Strain: Not on file  Food Insecurity: Not on file  Transportation Needs: Not on file  Physical Activity: Not on file  Stress: Not on file  Social Connections: Not on file  Intimate Partner Violence: Not on file    Family  History  Problem Relation Age of Onset  . Rheum arthritis Mother   . Liver disease Father   . Cancer Sister        Lung    ROS: no fevers or chills, productive cough, hemoptysis, dysphasia, odynophagia, melena, hematochezia, dysuria, hematuria, rash, seizure activity, orthopnea, PND, pedal edema, claudication. Remaining systems are negative.  Physical Exam: Well-developed well-nourished in no acute distress.  Skin is warm and dry.  HEENT is normal.  Neck is supple.  Chest is clear to auscultation with normal expansion.  Cardiovascular exam is regular rate and rhythm.  Abdominal exam nontender or distended. No masses palpated. Extremities show no edema. neuro grossly intact  ECG- personally  reviewed  A/P  1 labile hypertension-patient has complained of episodes of low blood pressure for up to 2 days at a time previously.  At last office visit losartan was discontinued.  We will allow her blood pressure to run higher to avoid these episodes.  We will continue hydrochlorothiazide at present dose.  2 coronary artery disease-mild on previous CTA.  Continue aspirin and statin.  Olga Millers, MD

## 2020-12-23 DIAGNOSIS — G4733 Obstructive sleep apnea (adult) (pediatric): Secondary | ICD-10-CM | POA: Diagnosis not present

## 2020-12-23 DIAGNOSIS — G4719 Other hypersomnia: Secondary | ICD-10-CM | POA: Diagnosis not present

## 2020-12-23 DIAGNOSIS — G4721 Circadian rhythm sleep disorder, delayed sleep phase type: Secondary | ICD-10-CM | POA: Diagnosis not present

## 2020-12-28 ENCOUNTER — Ambulatory Visit: Payer: Medicare PPO | Admitting: Cardiology

## 2020-12-31 DIAGNOSIS — G4733 Obstructive sleep apnea (adult) (pediatric): Secondary | ICD-10-CM | POA: Diagnosis not present

## 2021-01-02 DIAGNOSIS — G4733 Obstructive sleep apnea (adult) (pediatric): Secondary | ICD-10-CM | POA: Diagnosis not present

## 2021-01-02 DIAGNOSIS — M797 Fibromyalgia: Secondary | ICD-10-CM | POA: Diagnosis not present

## 2021-01-02 DIAGNOSIS — G14 Postpolio syndrome: Secondary | ICD-10-CM | POA: Diagnosis not present

## 2021-01-05 DIAGNOSIS — M1711 Unilateral primary osteoarthritis, right knee: Secondary | ICD-10-CM | POA: Diagnosis not present

## 2021-01-07 DIAGNOSIS — G4733 Obstructive sleep apnea (adult) (pediatric): Secondary | ICD-10-CM | POA: Diagnosis not present

## 2021-01-12 DIAGNOSIS — M1711 Unilateral primary osteoarthritis, right knee: Secondary | ICD-10-CM | POA: Diagnosis not present

## 2021-01-24 DIAGNOSIS — M1711 Unilateral primary osteoarthritis, right knee: Secondary | ICD-10-CM | POA: Diagnosis not present

## 2021-02-02 DIAGNOSIS — G4733 Obstructive sleep apnea (adult) (pediatric): Secondary | ICD-10-CM | POA: Diagnosis not present

## 2021-02-02 DIAGNOSIS — G14 Postpolio syndrome: Secondary | ICD-10-CM | POA: Diagnosis not present

## 2021-02-02 DIAGNOSIS — M797 Fibromyalgia: Secondary | ICD-10-CM | POA: Diagnosis not present

## 2021-02-07 DIAGNOSIS — G4733 Obstructive sleep apnea (adult) (pediatric): Secondary | ICD-10-CM | POA: Diagnosis not present

## 2021-02-08 DIAGNOSIS — G4721 Circadian rhythm sleep disorder, delayed sleep phase type: Secondary | ICD-10-CM | POA: Diagnosis not present

## 2021-02-15 DIAGNOSIS — E039 Hypothyroidism, unspecified: Secondary | ICD-10-CM | POA: Diagnosis not present

## 2021-02-15 DIAGNOSIS — I10 Essential (primary) hypertension: Secondary | ICD-10-CM | POA: Diagnosis not present

## 2021-02-15 DIAGNOSIS — R531 Weakness: Secondary | ICD-10-CM | POA: Diagnosis not present

## 2021-02-15 DIAGNOSIS — K219 Gastro-esophageal reflux disease without esophagitis: Secondary | ICD-10-CM | POA: Diagnosis not present

## 2021-02-15 DIAGNOSIS — F419 Anxiety disorder, unspecified: Secondary | ICD-10-CM | POA: Diagnosis not present

## 2021-02-15 DIAGNOSIS — N3281 Overactive bladder: Secondary | ICD-10-CM | POA: Diagnosis not present

## 2021-02-15 DIAGNOSIS — G14 Postpolio syndrome: Secondary | ICD-10-CM | POA: Diagnosis not present

## 2021-02-15 DIAGNOSIS — E559 Vitamin D deficiency, unspecified: Secondary | ICD-10-CM | POA: Diagnosis not present

## 2021-02-15 DIAGNOSIS — E782 Mixed hyperlipidemia: Secondary | ICD-10-CM | POA: Diagnosis not present

## 2021-03-04 DIAGNOSIS — G14 Postpolio syndrome: Secondary | ICD-10-CM | POA: Diagnosis not present

## 2021-03-04 DIAGNOSIS — M797 Fibromyalgia: Secondary | ICD-10-CM | POA: Diagnosis not present

## 2021-03-04 DIAGNOSIS — G4733 Obstructive sleep apnea (adult) (pediatric): Secondary | ICD-10-CM | POA: Diagnosis not present

## 2021-03-09 DIAGNOSIS — G4733 Obstructive sleep apnea (adult) (pediatric): Secondary | ICD-10-CM | POA: Diagnosis not present

## 2021-04-01 DIAGNOSIS — G4733 Obstructive sleep apnea (adult) (pediatric): Secondary | ICD-10-CM | POA: Diagnosis not present

## 2021-04-11 NOTE — Progress Notes (Signed)
HPI: Follow-up hypertension.  Previously followed by Dr. Graciela Husbands.  Apparently had previous evaluation to exclude hyperaldosteronism and pheochromocytoma. Echocardiogram October 2017 showed normal LV function. Cardiac CTA November 2018 showed calcium score of 29, less than 30% left main, less than 30% LAD. Aortic root mildly dilated at 38 mm. Renal Dopplers November 2018 showed no renal artery stenosis. Losartan discontinued at previous office visit.  Since last seen, she has some dyspnea on exertion.  No orthopnea or PND.  Occasional mild pedal edema.  She denies chest pain or syncope.  She does have fatigue.  Current Outpatient Medications  Medication Sig Dispense Refill   aspirin EC 81 MG tablet Take 1 tablet (81 mg total) by mouth daily. Swallow whole. 90 tablet 3   dexlansoprazole (DEXILANT) 60 MG capsule Take 1 capsule (60 mg total) by mouth daily. 90 capsule 3   hydrochlorothiazide (MICROZIDE) 12.5 MG capsule TAKE 1 CAPSULE BY MOUTH DAILY 90 capsule 3   HYDROmorphone (DILAUDID) 2 MG tablet Take 1 tablet (2 mg total) by mouth every 4 (four) hours as needed for severe pain. 10 tablet 0   levothyroxine (SYNTHROID) 137 MCG tablet Take 137 mcg by mouth daily before breakfast.     midodrine (PROAMATINE) 2.5 MG tablet Take 1 tablet by mouth every 4 hours while awake if your systolic blood pressure is below 110 90 tablet 1   MYRBETRIQ 25 MG TB24 tablet      RESTASIS 0.05 % ophthalmic emulsion 1 drop 2 (two) times daily.     sertraline (ZOLOFT) 50 MG tablet Take 50 mg by mouth daily.     traMADol (ULTRAM) 50 MG tablet      rosuvastatin (CRESTOR) 20 MG tablet Take 1 tablet (20 mg total) by mouth daily. 90 tablet 3   No current facility-administered medications for this visit.     Past Medical History:  Diagnosis Date   Arthritis    Blood transfusion without reported diagnosis    Cataract    Colitis    Depression    Diverticulosis    Encounter for Zostavax administration 02/10/2008    Fibromyalgia    GERD (gastroesophageal reflux disease)    Hemorrhoids, internal    Hypertension    Immunization, tetanus-diphtheria 10/2002   Neuromuscular disorder (HCC)    Pneumococcal vaccination given 01/2009   Polio    Ulcer     Past Surgical History:  Procedure Laterality Date   ABDOMINAL HYSTERECTOMY     APPENDECTOMY     CHOLECYSTECTOMY     EYE SURGERY      Social History   Socioeconomic History   Marital status: Married    Spouse name: Starleen Blue   Number of children: 2   Years of education: College   Highest education level: Not on file  Occupational History   Occupation: Retired Runner, broadcasting/film/video  Tobacco Use   Smoking status: Never   Smokeless tobacco: Never  Vaping Use   Vaping Use: Never used  Substance and Sexual Activity   Alcohol use: No   Drug use: No   Sexual activity: Yes  Other Topics Concern   Not on file  Social History Narrative   Pt lives at home with spouse in a one story home.  Has one daughter.  Retired Runner, broadcasting/film/video.  Education: college.    Caffeine Use: quit 09/2012   Patient is right handed   Social Determinants of Health   Financial Resource Strain: Not on file  Food Insecurity: Not on file  Transportation Needs: Not on file  Physical Activity: Not on file  Stress: Not on file  Social Connections: Not on file  Intimate Partner Violence: Not on file    Family History  Problem Relation Age of Onset   Rheum arthritis Mother    Liver disease Father    Cancer Sister        Lung    ROS: no fevers or chills, productive cough, hemoptysis, dysphasia, odynophagia, melena, hematochezia, dysuria, hematuria, rash, seizure activity, orthopnea, PND, pedal edema, claudication. Remaining systems are negative.  Physical Exam: Well-developed well-nourished in no acute distress.  Skin is warm and dry.  HEENT is normal.  Neck is supple.  Chest is clear to auscultation with normal expansion.  Cardiovascular exam is regular rate and rhythm.  Abdominal  exam nontender or distended. No masses palpated. Extremities show no edema. neuro grossly intact  ECG- personally reviewed  A/P  1 labile hypertension-patient has had difficulties with periods of low blood pressure resulting in feelings of weakness; she also has times when her systolic blood pressure is 190.  She will continue hydrochlorothiazide at present dose and she takes losartan if her systolic is above 150.  Will not make changes at this point.  2 previous chest pain-no recurrences.  Previous CTA showed nonobstructive coronary disease.  3 coronary artery disease-nonobstructive.  Continue aspirin and statin.  4 hyperlipidemia-continue statin.   Olga Millers, MD

## 2021-04-12 DIAGNOSIS — G4733 Obstructive sleep apnea (adult) (pediatric): Secondary | ICD-10-CM | POA: Diagnosis not present

## 2021-04-12 DIAGNOSIS — G4721 Circadian rhythm sleep disorder, delayed sleep phase type: Secondary | ICD-10-CM | POA: Diagnosis not present

## 2021-04-14 ENCOUNTER — Ambulatory Visit: Payer: Medicare PPO | Admitting: Cardiology

## 2021-04-14 ENCOUNTER — Encounter: Payer: Self-pay | Admitting: Cardiology

## 2021-04-14 ENCOUNTER — Other Ambulatory Visit: Payer: Self-pay

## 2021-04-14 VITALS — BP 182/86 | HR 83 | Ht 63.0 in | Wt 149.8 lb

## 2021-04-14 DIAGNOSIS — I10 Essential (primary) hypertension: Secondary | ICD-10-CM | POA: Diagnosis not present

## 2021-04-14 DIAGNOSIS — I2510 Atherosclerotic heart disease of native coronary artery without angina pectoris: Secondary | ICD-10-CM

## 2021-04-14 DIAGNOSIS — E7800 Pure hypercholesterolemia, unspecified: Secondary | ICD-10-CM | POA: Diagnosis not present

## 2021-04-14 NOTE — Patient Instructions (Signed)

## 2021-04-18 DIAGNOSIS — M1711 Unilateral primary osteoarthritis, right knee: Secondary | ICD-10-CM | POA: Diagnosis not present

## 2021-04-27 DIAGNOSIS — Z01110 Encounter for hearing examination following failed hearing screening: Secondary | ICD-10-CM | POA: Diagnosis not present

## 2021-05-02 DIAGNOSIS — M25661 Stiffness of right knee, not elsewhere classified: Secondary | ICD-10-CM | POA: Diagnosis not present

## 2021-05-02 DIAGNOSIS — M6281 Muscle weakness (generalized): Secondary | ICD-10-CM | POA: Diagnosis not present

## 2021-05-02 DIAGNOSIS — M1711 Unilateral primary osteoarthritis, right knee: Secondary | ICD-10-CM | POA: Diagnosis not present

## 2021-06-13 DIAGNOSIS — E039 Hypothyroidism, unspecified: Secondary | ICD-10-CM | POA: Diagnosis not present

## 2021-06-13 DIAGNOSIS — I10 Essential (primary) hypertension: Secondary | ICD-10-CM | POA: Diagnosis not present

## 2021-06-13 DIAGNOSIS — E782 Mixed hyperlipidemia: Secondary | ICD-10-CM | POA: Diagnosis not present

## 2021-06-13 DIAGNOSIS — F3342 Major depressive disorder, recurrent, in full remission: Secondary | ICD-10-CM | POA: Diagnosis not present

## 2021-06-13 DIAGNOSIS — G14 Postpolio syndrome: Secondary | ICD-10-CM | POA: Diagnosis not present

## 2021-06-13 DIAGNOSIS — E559 Vitamin D deficiency, unspecified: Secondary | ICD-10-CM | POA: Diagnosis not present

## 2021-06-13 DIAGNOSIS — Z23 Encounter for immunization: Secondary | ICD-10-CM | POA: Diagnosis not present

## 2021-06-13 DIAGNOSIS — M797 Fibromyalgia: Secondary | ICD-10-CM | POA: Diagnosis not present

## 2021-06-13 DIAGNOSIS — I509 Heart failure, unspecified: Secondary | ICD-10-CM | POA: Diagnosis not present

## 2021-06-13 DIAGNOSIS — M6281 Muscle weakness (generalized): Secondary | ICD-10-CM | POA: Diagnosis not present

## 2021-06-27 ENCOUNTER — Telehealth: Payer: Self-pay | Admitting: Physician Assistant

## 2021-06-27 NOTE — Telephone Encounter (Signed)
Pt c/o BP issue: STAT if pt c/o blurred vision, one-sided weakness or slurred speech  1. What are your last 5 BP readings?  06/27/21 12:20 PM 152/87 AM 119/63 AM 111/58  2. Are you having any other symptoms (ex. Dizziness, headache, blurred vision, passed out)? Falls asleep when is gets low & SOB   3. What is your BP issue? Fluctuating BP. Scheduled for an appointment on Thursday

## 2021-06-27 NOTE — Telephone Encounter (Signed)
Returned call to patient of Dr. Jens Som who reports her BP readings all over the place. Nothing new, but worse over the last few weeks  BP was 194/133 at PCP office 1 month ago Ever since then, BP has been more irregular PCP told her she is skipping beats and her home BP monitor indicates abnormal heart beat   Per last MD note 04/2021: 1 labile hypertension-patient has had difficulties with periods of low blood pressure resulting in feelings of weakness; she also has times when her systolic blood pressure is 190.  She will continue hydrochlorothiazide at present dose and she takes losartan if her systolic is above 150.  Will not make changes at this point.  She follows this plan as mentioned above. She is having to take PRN losartan at least twice weekly. Said when she does this, it bottoms her BP out and she feels bad  Routed to MD to review She has an appt on 06/30/21

## 2021-06-30 ENCOUNTER — Other Ambulatory Visit: Payer: Self-pay

## 2021-06-30 ENCOUNTER — Other Ambulatory Visit: Payer: Self-pay | Admitting: Physician Assistant

## 2021-06-30 ENCOUNTER — Ambulatory Visit: Payer: Medicare PPO | Admitting: Physician Assistant

## 2021-06-30 ENCOUNTER — Encounter: Payer: Self-pay | Admitting: Physician Assistant

## 2021-06-30 ENCOUNTER — Ambulatory Visit (INDEPENDENT_AMBULATORY_CARE_PROVIDER_SITE_OTHER): Payer: Medicare PPO

## 2021-06-30 VITALS — BP 170/85 | HR 75 | Ht 63.6 in | Wt 150.4 lb

## 2021-06-30 DIAGNOSIS — I493 Ventricular premature depolarization: Secondary | ICD-10-CM

## 2021-06-30 DIAGNOSIS — I499 Cardiac arrhythmia, unspecified: Secondary | ICD-10-CM

## 2021-06-30 DIAGNOSIS — I2510 Atherosclerotic heart disease of native coronary artery without angina pectoris: Secondary | ICD-10-CM

## 2021-06-30 DIAGNOSIS — R0989 Other specified symptoms and signs involving the circulatory and respiratory systems: Secondary | ICD-10-CM

## 2021-06-30 DIAGNOSIS — E785 Hyperlipidemia, unspecified: Secondary | ICD-10-CM | POA: Diagnosis not present

## 2021-06-30 DIAGNOSIS — M25473 Effusion, unspecified ankle: Secondary | ICD-10-CM

## 2021-06-30 DIAGNOSIS — Z01810 Encounter for preprocedural cardiovascular examination: Secondary | ICD-10-CM | POA: Diagnosis not present

## 2021-06-30 DIAGNOSIS — G4733 Obstructive sleep apnea (adult) (pediatric): Secondary | ICD-10-CM | POA: Diagnosis not present

## 2021-06-30 NOTE — Progress Notes (Unsigned)
Patient enrolled for [employer] to mail a 7 day ZIO XT monitor to her address on file. 

## 2021-06-30 NOTE — Progress Notes (Signed)
Cardiology Office Note:    Date:  07/02/2021   ID:  Erica Davidson, DOB 04-16-1941, MRN [id number]  PCP:  Merri Brunette, MD   [facility] Providers Cardiologist:  Olga Millers, MD     Referring MD: Merri Brunette, MD   Chief Complaint  Patient presents with   Follow-up    Seen for Dr. Jens Som     History of Present Illness:    Erica Davidson is a 80 y.o. female with a hx of HTN, HLD and CAD.  Patient had a previous evaluation to exclude hyperaldosteronism and pheochromocytoma.  Echocardiogram obtained in October 2017 showed normal EF.  Coronary CTA in November 2018 showed calcium score 29, <30% left main, <30% LAD lesion, mildly dilated aortic root measuring at 38 mm.  Renal artery Doppler in November 2018 showed no renal artery stenosis.  Losartan was previously discontinued in November 2021.  Patient was last seen by Dr. Jens Som in August 2022 at which time she continued to have labile hypertension with periods of low blood pressure resulting in feeling of weakness.  She also has times where her systolic blood pressure runs in the 190s.  She was instructed to take hydrochlorothiazide as a maintenance therapy and only take losartan if systolic blood pressures >150.  Patient presents today for evaluation of labile blood pressure.  Looking at her home blood pressure diary, most of the time her blood pressures in the 120s to 130s range.  Whenever her systolic blood pressure drops to 110s and diastolic blood pressure drop below 60 mmHg, she feels very poorly and become more sleepy.  She also has occasional high blood pressure into the 170s to 190s range.  She takes PRN losartan for blood pressure spikes.  Although she has midodrine for low blood pressure, however she has never used it.  I checked her blood pressure manually myself.  Left arm pressure was 172/78, right arm pressure 174/70.  I am hesitant to adjust her maintenance therapy of hydrochlorothiazide as she is very sensitive to  what blood pressure changes, I recommended continue on the current therapy.  She mentions of 2 weeks of feeling bad but no chest pain or worsening dyspnea.  For the time being, I recommended continue on the current therapy.  She also has some ankle edema which I suspect is due to venous insufficiency.  I recommended leg elevation.  She is planning to undergo knee replacement surgery by Dr. 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  However she is still able to do every day activity without much issue.  From the cardiac perspective, she is cleared to proceed with knee surgery if needed.  Her PCP mentions she has irregular heartbeat.  Her home blood pressure machine also wants her off irregular heartbeat occasionally.  She does not have any clear symptoms.  I recommend a 1 week Zio heart monitor.  She can follow-up with Dr. Jens Som as scheduled in February, earlier if her monitor come back abnormal.   Past Medical History:  Diagnosis Date   Arthritis    Blood transfusion without reported diagnosis    Cataract    Colitis    Depression    Diverticulosis    Encounter for Zostavax administration 02/10/2008   Fibromyalgia    GERD (gastroesophageal reflux disease)    Hemorrhoids, internal    Hypertension    Immunization, tetanus-diphtheria 10/2002   Neuromuscular disorder (HCC)    Pneumococcal vaccination given 01/2009  Polio    Ulcer     Past Surgical History:  Procedure Laterality Date   ABDOMINAL HYSTERECTOMY     APPENDECTOMY     CHOLECYSTECTOMY     EYE SURGERY      Current Medications: Current Meds  Medication Sig   aspirin EC 81 MG tablet Take 1 tablet (81 mg total) by mouth daily. Swallow whole.   dexlansoprazole (DEXILANT) 60 MG capsule Take 1 capsule (60 mg total) by mouth daily.   hydrochlorothiazide (MICROZIDE) 12.5 MG capsule TAKE 1 CAPSULE BY MOUTH DAILY   levothyroxine (SYNTHROID) 137 MCG tablet Take 137 mcg by mouth  daily before breakfast.   losartan (COZAAR) 50 MG tablet Take 50 mg by mouth as needed. Take if SBP is > 150   RESTASIS 0.05 % ophthalmic emulsion 1 drop 2 (two) times daily.   sertraline (ZOLOFT) 50 MG tablet Take 50 mg by mouth daily.   traMADol (ULTRAM) 50 MG tablet      Allergies:   Zithromax [azithromycin], Ciprofloxacin, Loratadine-pseudoephedrine er, Penicillins, Prednisone, and Codeine   Social History   Socioeconomic History   Marital status: Married    Spouse name: Starleen Blue   Number of children: 2   Years of education: College   Highest education level: Not on file  Occupational History   Occupation: Retired Runner, broadcasting/film/video  Tobacco Use   Smoking status: Never   Smokeless tobacco: Never  Vaping Use   Vaping Use: Never used  Substance and Sexual Activity   Alcohol use: No   Drug use: No   Sexual activity: Yes  Other Topics Concern   Not on file  Social History Narrative   Pt lives at home with spouse in a one story home.  Has one daughter.  Retired Runner, broadcasting/film/video.  Education: college.    Caffeine Use: quit 09/2012   Patient is right handed   Social Determinants of Health   Financial Resource Strain: Not on file  Food Insecurity: Not on file  Transportation Needs: Not on file  Physical Activity: Not on file  Stress: Not on file  Social Connections: Not on file     Family History: The patient's family history includes Cancer in her sister; Liver disease in her father; Rheum arthritis in her mother.  ROS:   Please see the history of present illness.     All other systems reviewed and are negative.  EKGs/Labs/Other Studies Reviewed:    The following studies were reviewed today:  Echo 06/22/2016 LV EF: 55% -   60%   -------------------------------------------------------------------  Indications:      Palpitation (R00.2).   -------------------------------------------------------------------  History:   Risk factors:  OSA. Post-Polio syndrome. Hypertension.      -------------------------------------------------------------------  Study Conclusions   - Procedure narrative: Transthoracic echocardiography. Image    quality was suboptimal. The study was technically difficult.    Intravenous contrast (Definity) was administered.  - Left ventricle: The cavity size was normal. Systolic function was    normal. The estimated ejection fraction was in the range of 55%    to 60%. Wall motion was normal; there were no regional wall    motion abnormalities. Left ventricular diastolic function    parameters were normal.  - Atrial septum: No defect or patent foramen ovale was identified.   EKG:  EKG is ordered today.  The ekg ordered today demonstrates normal sinus rhythm, no significant ST-T wave changes.  Recent Labs: 10/27/2020: ALT 14  Recent Lipid Panel    Component Value Date/Time  CHOL 159 10/27/2020 1218   TRIG 75 10/27/2020 1218   HDL 81 10/27/2020 1218   CHOLHDL 2.0 10/27/2020 1218   LDLCALC 64 10/27/2020 1218     Risk Assessment/Calculations:           Physical Exam:    VS:  BP (!) 170/85   Pulse 75   Ht 5' 3.6" (1.615 m)   Wt 150 lb 6.4 oz (68.2 kg)   SpO2 98%   BMI 26.14 kg/m     Wt Readings from Last 3 Encounters:  06/30/21 150 lb 6.4 oz (68.2 kg)  04/14/21 149 lb 12.8 oz (67.9 kg)  07/19/20 140 lb (63.5 kg)     GEN:  Well nourished, well developed in no acute distress HEENT: Normal NECK: No JVD; No carotid bruits LYMPHATICS: No lymphadenopathy CARDIAC: RRR, no murmurs, rubs, gallops RESPIRATORY:  Clear to auscultation without rales, wheezing or rhonchi  ABDOMEN: Soft, non-tender, non-distended MUSCULOSKELETAL:  No edema; No deformity  SKIN: Warm and dry NEUROLOGIC:  Alert and oriented x 3 PSYCHIATRIC:  Normal affect   ASSESSMENT:    1. Irregular heart beat   2. Coronary artery disease involving native coronary artery of native heart without angina pectoris   3. Labile hypertension   4. Hyperlipidemia LDL  goal <70   5. Ankle edema   6. Preop cardiovascular exam    PLAN:    In order of problems listed above:  Palpitation: I recommend 1 week ZIO monitor to further assess.  CAD: Denies any recent chest pain.  CTA obtained in November 2018 showed mild disease.  Labile blood pressure: Systolic blood pressure in the 120s to 130s most of the time, however she has occasional drop into the 110s at which time she is feeling poorly.  She also has occasional spikes in the blood pressure up to the 170s.  I recommended continue on the current therapy.  She is on hydrochlorothiazide 12.5 mg daily.  She also takes losartan as needed if systolic blood pressures greater than 150.  Although she has a prescription for midodrine, she has never used it.  Hyperlipidemia: On Crestor  Ankle edema: Related to venous insufficiency.  Leg elevation.  Preoperative clearance: She may require knee surgery by Dr. Jerl Santos in the near future.  She has a history of polio and neuromuscular disorder, however she is still able to do most of household activity without any issue.  From the cardiac perspective, she is optimized to proceed with knee surgery without further work-up.  If needed, she may hold aspirin for 5 to 7 days prior to the procedure and restart as soon as possible afterward at the surgeon's discretion.     Medication Adjustments/Labs and Tests Ordered: Current medicines are reviewed at length with the patient today.  Concerns regarding medicines are outlined above.  Orders Placed This Encounter  Procedures   EKG 12-Lead   No orders of the defined types were placed in this encounter.   Patient Instructions  Medication Instructions:  Your physician recommends that you continue on your current medications as directed. Please refer to the Current Medication list given to you today.  *If you need a refill on your cardiac medications before your next appointment, please call your pharmacy*  Lab Work: NONE  ordered at this time of appointment   If you have labs (blood work) drawn today and your tests are completely normal, you will receive your results only by: MyChart Message (if you have MyChart) OR A paper copy  in the mail If you have any lab test that is abnormal or we need to change your treatment, we will call you to review the results.  Testing/Procedures:  Christena Deem- Long Term Monitor Instructions  Your physician has requested you wear a ZIO patch monitor for 7 days.  This is a single patch monitor. [employer] supplies one patch monitor per enrollment. Additional stickers are not available. Please do not apply patch if you will be having a Nuclear Stress Test,  Echocardiogram, Cardiac CT, MRI, or Chest Xray during the period you would be wearing the  monitor. The patch cannot be worn during these tests. You cannot remove and re-apply the  ZIO XT patch monitor.  Your ZIO patch monitor will be mailed 3 day USPS to your address on file. It may take 3-5 days  to receive your monitor after you have been enrolled.  Once you have received your monitor, please review the enclosed instructions. Your monitor  has already been registered assigning a specific monitor serial # to you.  Billing and Patient Assistance Program Information  We have supplied [employer] with any of your insurance information on file for billing purposes. [employer] offers a sliding scale Patient Assistance Program for patients that do not have  insurance, or whose insurance does not completely cover the cost of the ZIO monitor.  You must apply for the Patient Assistance Program to qualify for this discounted rate.  To apply, please call [employer] at [phone], select option 4, select option 2, ask to apply for  Patient Assistance Program. [name] will ask your household income, and how many people  are in your household. They will quote your out-of-pocket cost based on that information.  [employer] will also be able to set up a  33-month, interest-free payment plan if needed.  Applying the monitor   Shave hair from upper left chest.  Hold abrader disc by orange tab. Rub abrader in 40 strokes over the upper left chest as  indicated in your monitor instructions.  Clean area with 4 enclosed alcohol pads. Let dry.  Apply patch as indicated in monitor instructions. Patch will be placed under collarbone on left  side of chest with arrow pointing upward.  Rub patch adhesive wings for 2 minutes. Remove white label marked "1". Remove the white  label marked "2". Rub patch adhesive wings for 2 additional minutes.  While looking in a mirror, press and release button in center of patch. A small green light will  flash 3-4 times. This will be your only indicator that the monitor has been turned on.  Do not shower for the first 24 hours. You may shower after the first 24 hours.  Press the button if you feel a symptom. You will hear a small click. Record Date, Time and  Symptom in the Patient Logbook.  When you are ready to remove the patch, follow instructions on the last 2 pages of Patient  Logbook. Stick patch monitor onto the last page of Patient Logbook.  Place Patient Logbook in the blue and white box. Use locking tab on box and tape box closed  securely. The blue and white box has prepaid postage on it. Please place it in the mailbox as  soon as possible. Your physician should have your test results approximately 7 days after the  monitor has been mailed back to [facility].  Call [facility] Customer Care at [phone] if you have questions regarding  your ZIO XT patch monitor. Call them immediately if  you see an orange light blinking on your  monitor.  If your monitor falls off in less than 4 days, contact our Monitor department at [phone].  If your monitor becomes loose or falls off after 4 days call [employer] at [phone] for  suggestions on securing your monitor   Follow-Up: At [facility],  you and your health needs are our priority.  As part of our continuing mission to provide you with exceptional heart care, we have created designated Provider Care Teams.  These Care Teams include your primary Cardiologist (physician) and Advanced Practice Providers (APPs -  Physician Assistants and Nurse Practitioners) who all work together to provide you with the care you need, when you need it.  Your next appointment:   As scheduled    The format for your next appointment:   In Person  Provider:   Olga Millers, MD  Other Instructions    Signed, Azalee Course, PA  07/02/2021 9:07 PM    [facility]

## 2021-06-30 NOTE — Patient Instructions (Signed)
Medication Instructions:  Your physician recommends that you continue on your current medications as directed. Please refer to the Current Medication list given to you today.  *If you need a refill on your cardiac medications before your next appointment, please call your pharmacy*  Lab Work: NONE ordered at this time of appointment   If you have labs (blood work) drawn today and your tests are completely normal, you will receive your results only by: MyChart Message (if you have MyChart) OR A paper copy in the mail If you have any lab test that is abnormal or we need to change your treatment, we will call you to review the results.  Testing/Procedures:  Erica Davidson- Long Term Monitor Instructions  Your physician has requested you wear a ZIO patch monitor for 7 days.  This is a single patch monitor. [employer] supplies one patch monitor per enrollment. Additional stickers are not available. Please do not apply patch if you will be having a Nuclear Stress Test,  Echocardiogram, Cardiac CT, MRI, or Chest Xray during the period you would be wearing the  monitor. The patch cannot be worn during these tests. You cannot remove and re-apply the  ZIO XT patch monitor.  Your ZIO patch monitor will be mailed 3 day USPS to your address on file. It may take 3-5 days  to receive your monitor after you have been enrolled.  Once you have received your monitor, please review the enclosed instructions. Your monitor  has already been registered assigning a specific monitor serial # to you.  Billing and Patient Assistance Program Information  We have supplied [employer] with any of your insurance information on file for billing purposes. [employer] offers a sliding scale Patient Assistance Program for patients that do not have  insurance, or whose insurance does not completely cover the cost of the ZIO monitor.  You must apply for the Patient Assistance Program to qualify for this discounted rate.  To apply, please  call [employer] at [phone], select option 4, select option 2, ask to apply for  Patient Assistance Program. Erica Davidson will ask your household income, and how many people  are in your household. They will quote your out-of-pocket cost based on that information.  [employer] will also be able to set up a 5-month, interest-free payment plan if needed.  Applying the monitor   Shave hair from upper left chest.  Hold abrader disc by orange tab. Rub abrader in 40 strokes over the upper left chest as  indicated in your monitor instructions.  Clean area with 4 enclosed alcohol pads. Let dry.  Apply patch as indicated in monitor instructions. Patch will be placed under collarbone on left  side of chest with arrow pointing upward.  Rub patch adhesive wings for 2 minutes. Remove white label marked "1". Remove the white  label marked "2". Rub patch adhesive wings for 2 additional minutes.  While looking in a mirror, press and release button in center of patch. A small green light will  flash 3-4 times. This will be your only indicator that the monitor has been turned on.  Do not shower for the first 24 hours. You may shower after the first 24 hours.  Press the button if you feel a symptom. You will hear a small click. Record Date, Time and  Symptom in the Patient Logbook.  When you are ready to remove the patch, follow instructions on the last 2 pages of Patient  Logbook. Stick patch monitor onto the last page of  Patient Logbook.  Place Patient Logbook in the blue and white box. Use locking tab on box and tape box closed  securely. The blue and white box has prepaid postage on it. Please place it in the mailbox as  soon as possible. Your physician should have your test results approximately 7 days after the  monitor has been mailed back to [facility].  Call [facility] Customer Care at [phone] if you have questions regarding  your ZIO XT patch monitor. Call them immediately if you see an  orange light blinking on your  monitor.  If your monitor falls off in less than 4 days, contact our Monitor department at [phone].  If your monitor becomes loose or falls off after 4 days call [employer] at [phone] for  suggestions on securing your monitor   Follow-Up: At [facility], you and your health needs are our priority.  As part of our continuing mission to provide you with exceptional heart care, we have created designated Provider Care Teams.  These Care Teams include your primary Cardiologist (physician) and Advanced Practice Providers (APPs -  Physician Assistants and Nurse Practitioners) who all work together to provide you with the care you need, when you need it.  Your next appointment:   As scheduled    The format for your next appointment:   In Person  Provider:   Olga Millers, MD  Other Instructions

## 2021-07-02 ENCOUNTER — Encounter: Payer: Self-pay | Admitting: Physician Assistant

## 2021-07-05 DIAGNOSIS — I499 Cardiac arrhythmia, unspecified: Secondary | ICD-10-CM | POA: Diagnosis not present

## 2021-07-05 DIAGNOSIS — I493 Ventricular premature depolarization: Secondary | ICD-10-CM | POA: Diagnosis not present

## 2021-07-12 ENCOUNTER — Other Ambulatory Visit: Payer: Self-pay | Admitting: Cardiology

## 2021-07-12 DIAGNOSIS — I2510 Atherosclerotic heart disease of native coronary artery without angina pectoris: Secondary | ICD-10-CM

## 2021-07-12 NOTE — Telephone Encounter (Signed)
Patient called back and stated she does not need a refill on her Crestor.

## 2021-07-18 DIAGNOSIS — I493 Ventricular premature depolarization: Secondary | ICD-10-CM | POA: Diagnosis not present

## 2021-07-18 DIAGNOSIS — I499 Cardiac arrhythmia, unspecified: Secondary | ICD-10-CM | POA: Diagnosis not present

## 2021-08-09 ENCOUNTER — Other Ambulatory Visit: Payer: Self-pay | Admitting: Cardiology

## 2021-08-09 DIAGNOSIS — I2510 Atherosclerotic heart disease of native coronary artery without angina pectoris: Secondary | ICD-10-CM

## 2021-09-26 DIAGNOSIS — M1711 Unilateral primary osteoarthritis, right knee: Secondary | ICD-10-CM | POA: Diagnosis not present

## 2021-09-29 ENCOUNTER — Other Ambulatory Visit: Payer: Self-pay | Admitting: Orthopaedic Surgery

## 2021-09-30 ENCOUNTER — Telehealth: Payer: Self-pay | Admitting: Cardiology

## 2021-09-30 NOTE — Telephone Encounter (Signed)
° °  Pre-operative Risk Assessment    Patient Name: Erica Davidson  DOB: 09-17-1940 MRN: [id number]      Request for Surgical Clearance    Procedure:  R Hip Replacement  Date of Surgery:  Clearance 10/25/21                                 Surgeon:  Dr. Yisroel Ramming  Surgeon's Group or Practice Name:  [facility]  Phone number:  [phone] Fax number:  [phone]   Type of Clearance Requested:   - Medical  - Pharmacy:  Medications to hold TBD by Cardiology    Type of Anesthesia:  Spinal   Additional requests/questions:    Kandyce Rud   09/30/2021, 2:56 PM

## 2021-09-30 NOTE — Telephone Encounter (Signed)
Left voice mail to call back 

## 2021-10-03 DIAGNOSIS — K5901 Slow transit constipation: Secondary | ICD-10-CM | POA: Insufficient documentation

## 2021-10-03 DIAGNOSIS — R400 Somnolence: Secondary | ICD-10-CM | POA: Insufficient documentation

## 2021-10-03 DIAGNOSIS — E7800 Pure hypercholesterolemia, unspecified: Secondary | ICD-10-CM | POA: Insufficient documentation

## 2021-10-03 DIAGNOSIS — D72819 Decreased white blood cell count, unspecified: Secondary | ICD-10-CM | POA: Insufficient documentation

## 2021-10-03 DIAGNOSIS — G259 Extrapyramidal and movement disorder, unspecified: Secondary | ICD-10-CM | POA: Insufficient documentation

## 2021-10-03 DIAGNOSIS — H04123 Dry eye syndrome of bilateral lacrimal glands: Secondary | ICD-10-CM | POA: Insufficient documentation

## 2021-10-03 DIAGNOSIS — Z9181 History of falling: Secondary | ICD-10-CM | POA: Insufficient documentation

## 2021-10-03 DIAGNOSIS — R1312 Dysphagia, oropharyngeal phase: Secondary | ICD-10-CM | POA: Insufficient documentation

## 2021-10-03 DIAGNOSIS — F419 Anxiety disorder, unspecified: Secondary | ICD-10-CM | POA: Insufficient documentation

## 2021-10-03 DIAGNOSIS — H35039 Hypertensive retinopathy, unspecified eye: Secondary | ICD-10-CM | POA: Insufficient documentation

## 2021-10-03 DIAGNOSIS — E0500 Thyrotoxicosis with diffuse goiter without thyrotoxic crisis or storm: Secondary | ICD-10-CM | POA: Insufficient documentation

## 2021-10-03 DIAGNOSIS — N3281 Overactive bladder: Secondary | ICD-10-CM | POA: Insufficient documentation

## 2021-10-03 DIAGNOSIS — R531 Weakness: Secondary | ICD-10-CM | POA: Diagnosis not present

## 2021-10-03 DIAGNOSIS — F5101 Primary insomnia: Secondary | ICD-10-CM | POA: Insufficient documentation

## 2021-10-03 DIAGNOSIS — E559 Vitamin D deficiency, unspecified: Secondary | ICD-10-CM | POA: Insufficient documentation

## 2021-10-03 DIAGNOSIS — G4721 Circadian rhythm sleep disorder, delayed sleep phase type: Secondary | ICD-10-CM | POA: Insufficient documentation

## 2021-10-03 DIAGNOSIS — E782 Mixed hyperlipidemia: Secondary | ICD-10-CM | POA: Insufficient documentation

## 2021-10-03 DIAGNOSIS — Z9989 Dependence on other enabling machines and devices: Secondary | ICD-10-CM | POA: Insufficient documentation

## 2021-10-03 DIAGNOSIS — E039 Hypothyroidism, unspecified: Secondary | ICD-10-CM | POA: Insufficient documentation

## 2021-10-03 DIAGNOSIS — M797 Fibromyalgia: Secondary | ICD-10-CM | POA: Insufficient documentation

## 2021-10-03 DIAGNOSIS — I509 Heart failure, unspecified: Secondary | ICD-10-CM | POA: Insufficient documentation

## 2021-10-03 DIAGNOSIS — E2839 Other primary ovarian failure: Secondary | ICD-10-CM | POA: Insufficient documentation

## 2021-10-03 DIAGNOSIS — G14 Postpolio syndrome: Secondary | ICD-10-CM | POA: Diagnosis not present

## 2021-10-03 DIAGNOSIS — F3342 Major depressive disorder, recurrent, in full remission: Secondary | ICD-10-CM | POA: Insufficient documentation

## 2021-10-03 NOTE — Telephone Encounter (Signed)
I will forward this to the pre op pool as the pre op provider has been trying to reach the pt.

## 2021-10-03 NOTE — Telephone Encounter (Signed)
Patient contacted as part of preoperative protocol.  She reports fatigue, irregular heartbeats, and low blood pressure.  She will need office visit prior to being cleared for upcoming surgery.  Primary Cardiologist:Brian Jens Som, MD  Chart reviewed as part of pre-operative protocol coverage. Because of Erica Davidson's past medical history and time since last visit, he/she will require a follow-up visit in order to better assess preoperative cardiovascular risk.  Pre-op covering staff: - Please schedule appointment and call patient to inform them. - Please contact requesting surgeon's office via preferred method (i.e, phone, fax) to inform them of need for appointment prior to surgery.  If applicable, this message will also be routed to pharmacy pool and/or primary cardiologist for input on holding anticoagulant/antiplatelet agent as requested below so that this information is available at time of patient's appointment.   Ronney Asters, NP  10/03/2021, 10:33 AM

## 2021-10-03 NOTE — Telephone Encounter (Signed)
Patient returned call

## 2021-10-03 NOTE — Telephone Encounter (Signed)
Pt has appt with Dr. Stanford Breed 10/18/21. See notes from Pre op provider Coletta Memos, FNP. Will send to MD for upcoming appt. Will send FYI to requesting office pt has appt 10/18/21.

## 2021-10-05 DIAGNOSIS — I10 Essential (primary) hypertension: Secondary | ICD-10-CM | POA: Diagnosis not present

## 2021-10-05 DIAGNOSIS — Z01818 Encounter for other preprocedural examination: Secondary | ICD-10-CM | POA: Diagnosis not present

## 2021-10-06 NOTE — Progress Notes (Signed)
[id number] hypertension.  Previously followed by Dr. Caryl Comes.  Apparently had previous evaluation to exclude hyperaldosteronism and pheochromocytoma. Echocardiogram October 2017 showed normal LV function. Cardiac CTA November 2018 showed calcium score of 29, less than 30% left main, less than 30% LAD. Aortic root mildly dilated at 38 mm. Renal Dopplers November 2018 showed no renal artery stenosis. Monitor November 2022 showed sinus rhythm with occasional PAC, brief PAT and rare PVC.  Since last seen, there is no significant dyspnea on exertion, orthopnea, PND, pedal edema, chest pain or syncope.  She continues to have fluctuations in her blood pressure.  Current Outpatient Medications  Medication Sig Dispense Refill   aspirin EC 81 MG tablet Take 1 tablet (81 mg total) by mouth daily. Swallow whole. 90 tablet 3   dexlansoprazole (DEXILANT) 60 MG capsule Take 1 capsule (60 mg total) by mouth daily. 90 capsule 3   hydrochlorothiazide (MICROZIDE) 12.5 MG capsule TAKE 1 CAPSULE BY MOUTH DAILY 90 capsule 3   HYDROmorphone (DILAUDID) 2 MG tablet Take 1 tablet (2 mg total) by mouth every 4 (four) hours as needed for severe pain. (Patient not taking: Reported on 06/30/2021) 10 tablet 0   levothyroxine (SYNTHROID) 137 MCG tablet Take 137 mcg by mouth daily before breakfast.     losartan (COZAAR) 50 MG tablet Take 50 mg by mouth as needed. Take if SBP is > 150     midodrine (PROAMATINE) 2.5 MG tablet Take 1 tablet by mouth every 4 hours while awake if your systolic blood pressure is below 110 (Patient not taking: Reported on 06/30/2021) 90 tablet 1   MYRBETRIQ 25 MG TB24 tablet  (Patient not taking: Reported on 06/30/2021)     RESTASIS 0.05 % ophthalmic emulsion 1 drop 2 (two) times daily.     rosuvastatin (CRESTOR) 20 MG tablet TAKE 1 TABLET BY MOUTH DAILY 90 tablet 3   sertraline (ZOLOFT) 50 MG tablet Take 50 mg by mouth daily.     traMADol (ULTRAM) 50 MG tablet      No current  facility-administered medications for this visit.     Past Medical History:  Diagnosis Date   Arthritis    Blood transfusion without reported diagnosis    Cataract    Colitis    Depression    Diverticulosis    Encounter for Zostavax administration 02/10/2008   Fibromyalgia    GERD (gastroesophageal reflux disease)    Hemorrhoids, internal    Hypertension    Immunization, tetanus-diphtheria 10/2002   Neuromuscular disorder ([place])    Pneumococcal vaccination given 01/2009   Polio    Ulcer     Past Surgical History:  Procedure Laterality Date   ABDOMINAL HYSTERECTOMY     APPENDECTOMY     CHOLECYSTECTOMY     EYE SURGERY      Social History   Socioeconomic History   Marital status: Married    Spouse name: Will Bonnet   Number of children: 2   Years of education: College   Highest education level: Not on file  Occupational History   Occupation: Retired Pharmacist, hospital  Tobacco Use   Smoking status: Never   Smokeless tobacco: Never  Vaping Use   Vaping Use: Never used  Substance and Sexual Activity   Alcohol use: No   Drug use: No   Sexual activity: Yes  Other Topics Concern   Not on file  Social History Narrative   Pt lives at home with spouse in a one story home.  Has  one daughter.  Retired Pharmacist, hospital.  Education: college.    Caffeine Use: quit 09/2012   Patient is right handed   Social Determinants of Health   Financial Resource Strain: Not on file  Food Insecurity: Not on file  Transportation Needs: Not on file  Physical Activity: Not on file  Stress: Not on file  Social Connections: Not on file  Intimate Partner Violence: Not on file    Family History  Problem Relation Age of Onset   Rheum arthritis Mother    Liver disease Father    Cancer Sister        Lung    ROS: no fevers or chills, productive cough, hemoptysis, dysphasia, odynophagia, melena, hematochezia, dysuria, hematuria, rash, seizure activity, orthopnea, PND, pedal edema, claudication. Remaining  systems are negative.  Physical Exam: Well-developed well-nourished in no acute distress.  Skin is warm and dry.  HEENT is normal.  Neck is supple.  Chest is clear to auscultation with normal expansion.  Cardiovascular exam is regular rate and rhythm.  Abdominal exam nontender or distended. No masses palpated. Extremities show no edema. neuro grossly intact  ECG-normal sinus rhythm at a rate of 77, normal axis, nonspecific ST changes.  Personally reviewed  A/P  1 labile hypertension-as outlined in previous notes she has had difficulties with blood pressure spikes and intermittent low blood pressure.  Continue HCTZ.  She also takes losartan if her systolic is above 123456.  Recent potassium 3.2.  We will add K-Dur 20 mg p.o. QOD.  Check potassium and renal function in 1 week.  2 preoperative evaluation prior to knee replacement-patient has not had chest pain and has reasonable functional capacity.  Previous CTA showed mild nonobstructive coronary disease.  She may proceed without further cardiac evaluation.  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  Continue medical therapy with aspirin and statin.  4 hyperlipidemia-continue statin.  5 history of palpitations-symptoms have improved.  Previous monitor showed no significant arrhythmia.  Can add beta-blockade in the future if needed.  Kirk Ruths, MD

## 2021-10-11 ENCOUNTER — Encounter: Payer: Self-pay | Admitting: Cardiology

## 2021-10-11 ENCOUNTER — Other Ambulatory Visit: Payer: Self-pay | Admitting: Cardiology

## 2021-10-14 NOTE — Patient Instructions (Signed)
DUE TO COVID-19 ONLY ONE VISITOR IS ALLOWED TO COME WITH YOU AND STAY IN THE WAITING ROOM ONLY DURING PRE OP AND PROCEDURE DAY OF SURGERY.            Your procedure is scheduled on: 10-25-21   Report to [facility] Main  Entrance   Report to admitting at     0715  AM     Call this number if you have problems the morning of surgery [phone]   Remember: NO SOLID FOOD AFTER MIDNIGHT THE NIGHT PRIOR TO SURGERY. NOTHING BY MOUTH EXCEPT CLEAR LIQUIDS UNTIL  0700 am  .   PLEASE FINISH ENSURE DRINK PER SURGEON ORDER  WHICH NEEDS TO BE COMPLETED AT      0700 am then nothing by mouth.     CLEAR LIQUID DIET                                                                    water Black Coffee and tea, regular and decaf No Creamer                            Plain Jell-O any favor except red or purple                                  Fruit ices (not with fruit pulp)                                      Iced Popsicles                                     Carbonated beverages, regular and diet                                    Cranberry, grape and apple juices Sports drinks like Gatorade Lightly seasoned clear broth or consume(fat free) Sugar, honey syrup  _____________________________________________________________________      BRUSH YOUR TEETH MORNING OF SURGERY AND RINSE YOUR MOUTH OUT, NO CHEWING GUM CANDY OR MINTS.     Take these medicines the morning of surgery with A SIP OF WATER: Sertraline, rosuvastatin, eye drops, tramadol,and tylenol if needed                                 You may not have any metal on your body including hair pins and              piercings  Do not wear jewelry, make-up, lotions, powders,perfumes,        deodorant             Do not wear nail polish on your fingernails or toenails .  Do not shave  48 hours prior to surgery.              Do not bring valuables  to the hospital. [facility] IS NOT             RESPONSIBLE   FOR  VALUABLES.  Contacts, dentures or bridgework may not be worn into surgery.      Patients discharged the day of surgery will not be allowed to drive home. IF YOU ARE HAVING SURGERY AND GOING HOME THE SAME DAY, YOU MUST HAVE AN ADULT TO DRIVE YOU HOME AND BE WITH YOU FOR 24 HOURS. YOU MAY GO HOME BY TAXI OR UBER OR ORTHERWISE, BUT AN ADULT MUST ACCOMPANY YOU HOME AND STAY WITH YOU FOR 24 HOURS.  Name and phone number of your driver:  Special Instructions: N/A              Please read over the following fact sheets you were given: _____________________________________________________________________             [facility] Pre[facility] Before surgery, you can play an important role.  Because skin is not sterile, your skin needs to be as free of germs as possible.  You can reduce the number of germs on your skin by washing with CHG (chlorahexidine gluconate) soap before surgery.  CHG is an antiseptic cleaner which kills germs and bonds with the skin to continue killing germs even after washing. Please DO NOT use if you have an allergy to CHG or antibacterial soaps.  If your skin becomes reddened/irritated stop using the CHG and inform your nurse when you arrive at Short Stay. Do not shave (including legs and underarms) for at least 48 hours prior to the first CHG shower.  You may shave your face/neck. Please follow these instructions carefully:  1.  Shower with CHG Soap the night before surgery and the  morning of Surgery.  2.  If you choose to wash your hair, wash your hair first as usual with your  normal  shampoo.  3.  After you shampoo, rinse your hair and body thoroughly to remove the  shampoo.                           4.  Use CHG as you would any other liquid soap.  You can apply chg directly  to the skin and wash                       Gently with a scrungie or clean washcloth.  5.  Apply the CHG Soap to your body ONLY FROM THE NECK DOWN.   Do not use on face/ open                            Wound or open sores. Avoid contact with eyes, ears mouth and genitals (private parts).                       Wash face,  Genitals (private parts) with your normal soap.             6.  Wash thoroughly, paying special attention to the area where your surgery  will be performed.  7.  Thoroughly rinse your body with warm water from the neck down.  8.  DO NOT shower/wash with your normal soap after using and rinsing off  the CHG Soap.                9.  Pat yourself dry  with a clean towel.            10.  Wear clean pajamas.            11.  Place clean sheets on your bed the night of your first shower and do not  sleep with pets. Day of Surgery : Do not apply any lotions/deodorants the morning of surgery.  Please wear clean clothes to the hospital/surgery center.  FAILURE TO FOLLOW THESE INSTRUCTIONS MAY RESULT IN THE CANCELLATION OF YOUR SURGERY PATIENT SIGNATURE_________________________________  NURSE SIGNATURE__________________________________  ________________________________________________________________________    Erica Davidson  An incentive spirometer is a tool that can help keep your lungs clear and active. This tool measures how well you are filling your lungs with each breath. Taking long deep breaths may help reverse or decrease the chance of developing breathing (pulmonary) problems (especially infection) following: A long period of time when you are unable to move or be active. BEFORE THE PROCEDURE  If the spirometer includes an indicator to show your best effort, your nurse or respiratory therapist will set it to a desired goal. If possible, sit up straight or lean slightly forward. Try not to slouch. Hold the incentive spirometer in an upright position. INSTRUCTIONS FOR USE  Sit on the edge of your bed if possible, or sit up as far as you can in bed or on a chair. Hold the incentive spirometer in an upright position. Breathe out normally. Place the  mouthpiece in your mouth and seal your lips tightly around it. Breathe in slowly and as deeply as possible, raising the piston or the ball toward the top of the column. Hold your breath for 3-5 seconds or for as long as possible. Allow the piston or ball to fall to the bottom of the column. Remove the mouthpiece from your mouth and breathe out normally. Rest for a few seconds and repeat Steps 1 through 7 at least 10 times every 1-2 hours when you are awake. Take your time and take a few normal breaths between deep breaths. The spirometer may include an indicator to show your best effort. Use the indicator as a goal to work toward during each repetition. After each set of 10 deep breaths, practice coughing to be sure your lungs are clear. If you have an incision (the cut made at the time of surgery), support your incision when coughing by placing a pillow or rolled up towels firmly against it. Once you are able to get out of bed, walk around indoors and cough well. You may stop using the incentive spirometer when instructed by your caregiver.  RISKS AND COMPLICATIONS Take your time so you do not get dizzy or light-headed. If you are in pain, you may need to take or ask for pain medication before doing incentive spirometry. It is harder to take a deep breath if you are having pain. AFTER USE Rest and breathe slowly and easily. It can be helpful to keep track of a log of your progress. Your caregiver can provide you with a simple table to help with this. If you are using the spirometer at home, follow these instructions: SEEK MEDICAL CARE IF:  You are having difficultly using the spirometer. You have trouble using the spirometer as often as instructed. Your pain medication is not giving enough relief while using the spirometer. You develop fever of 100.5 F (38.1 C) or higher. SEEK IMMEDIATE MEDICAL CARE IF:  You cough up bloody sputum that had not been present before. You  develop fever of 102 F  (38.9 C) or greater. You develop worsening pain at or near the incision site. MAKE SURE YOU:  Understand these instructions. Will watch your condition. Will get help right away if you are not doing well or get worse. Document Released: 01/01/2007 Document Revised: 11/13/2011 Document Reviewed: 03/04/2007 [facility] Patient Information 2014 [place], [place].   ________________________________________________________________________

## 2021-10-14 NOTE — Progress Notes (Addendum)
PCP - Candace Smith,MD Cardiologist - Olga Millers has clearance appt. 10-18-21  PPM/ICD -  Device Orders -  Rep Notified -   Chest x-ray -  EKG - 06-30-21 epic Stress Test -  ECHO - 2017 Cardiac Cath -  CT coronary 2018  Sleep Study -  CPAP -   Fasting Blood Sugar -  Checks Blood Sugar _____ times a day  Blood Thinner Instructions: Aspirin Instructions:  ERAS Protcol - PRE-SURGERY Ensure or G2-   COVID TEST- N/A COVID vaccine -x4 pfizer  Activity--Able to do ADL,s without SOB  Anesthesia review: HTN, Mild CAD, post polio syndrome, BP drops low and pt. Feels very weak at times when this happens  Patient denies shortness of breath, fever, cough and chest pain at PAT appointment   All instructions explained to the patient, with a verbal understanding of the material. Patient agrees to go over the instructions while at home for a better understanding. Patient also instructed to self quarantine after being tested for COVID-19. The opportunity to ask questions was provided.

## 2021-10-17 ENCOUNTER — Ambulatory Visit (HOSPITAL_COMMUNITY)
Admission: RE | Admit: 2021-10-17 | Discharge: 2021-10-17 | Disposition: A | Discharge status: Home / Self Care | Payer: Medicare PPO | Source: Ambulatory Visit | Attending: Orthopaedic Surgery | Admitting: Orthopaedic Surgery

## 2021-10-17 ENCOUNTER — Other Ambulatory Visit: Payer: Self-pay

## 2021-10-17 ENCOUNTER — Encounter (HOSPITAL_COMMUNITY): Payer: Self-pay

## 2021-10-17 ENCOUNTER — Encounter (HOSPITAL_COMMUNITY)
Admission: RE | Admit: 2021-10-17 | Discharge: 2021-10-17 | Disposition: A | Discharge status: Home / Self Care | Payer: Medicare PPO | Source: Ambulatory Visit | Attending: Orthopaedic Surgery | Admitting: Orthopaedic Surgery

## 2021-10-17 VITALS — BP 162/81 | HR 75 | Temp 98.4°F | Resp 16 | Ht 63.0 in | Wt 145.0 lb

## 2021-10-17 DIAGNOSIS — I10 Essential (primary) hypertension: Secondary | ICD-10-CM

## 2021-10-17 DIAGNOSIS — Z01818 Encounter for other preprocedural examination: Secondary | ICD-10-CM | POA: Diagnosis not present

## 2021-10-17 HISTORY — DX: Family history of other specified conditions: Z84.89

## 2021-10-17 HISTORY — DX: Hypotension, unspecified: I95.9

## 2021-10-17 HISTORY — DX: Other complications of anesthesia, initial encounter: T88.59XA

## 2021-10-17 HISTORY — DX: Inflammatory liver disease, unspecified: K75.9

## 2021-10-17 HISTORY — DX: Postpolio syndrome: G14

## 2021-10-17 HISTORY — DX: Atherosclerotic heart disease of native coronary artery without angina pectoris: I25.10

## 2021-10-17 HISTORY — DX: Pneumonia, unspecified organism: J18.9

## 2021-10-17 HISTORY — DX: Sleep apnea, unspecified: G47.30

## 2021-10-17 LAB — CBC
HCT: 39.8 % (ref 36.0–46.0)
Hemoglobin: 12.9 g/dL (ref 12.0–15.0)
MCH: 28.9 pg (ref 26.0–34.0)
MCHC: 32.4 g/dL (ref 30.0–36.0)
MCV: 89 fL (ref 80.0–100.0)
Platelets: 173 10*3/uL (ref 150–400)
RBC: 4.47 MIL/uL (ref 3.87–5.11)
RDW: 14.4 % (ref 11.5–15.5)
WBC: 3.6 10*3/uL — ABNORMAL LOW (ref 4.0–10.5)
nRBC: 0 % (ref 0.0–0.2)

## 2021-10-17 LAB — SURGICAL PCR SCREEN
MRSA, PCR: NEGATIVE
Staphylococcus aureus: POSITIVE — AB

## 2021-10-17 LAB — BASIC METABOLIC PANEL
Anion gap: 6 (ref 5–15)
BUN: 17 mg/dL (ref 8–23)
CO2: 28 mmol/L (ref 22–32)
Calcium: 9.3 mg/dL (ref 8.9–10.3)
Chloride: 104 mmol/L (ref 98–111)
Creatinine, Ser: 0.65 mg/dL (ref 0.44–1.00)
GFR, Estimated: >60 mL/min (ref >60)
Glucose, Bld: 106 mg/dL — ABNORMAL HIGH (ref 70–99)
Potassium: 3.2 mmol/L — ABNORMAL LOW (ref 3.5–5.1)
Sodium: 138 mmol/L (ref 135–145)

## 2021-10-18 ENCOUNTER — Ambulatory Visit: Payer: Medicare PPO | Admitting: Cardiology

## 2021-10-18 ENCOUNTER — Encounter: Payer: Self-pay | Admitting: Cardiology

## 2021-10-18 VITALS — BP 150/80 | HR 77 | Ht 63.0 in | Wt 149.2 lb

## 2021-10-18 DIAGNOSIS — I2510 Atherosclerotic heart disease of native coronary artery without angina pectoris: Secondary | ICD-10-CM

## 2021-10-18 DIAGNOSIS — R0989 Other specified symptoms and signs involving the circulatory and respiratory systems: Secondary | ICD-10-CM

## 2021-10-18 DIAGNOSIS — E785 Hyperlipidemia, unspecified: Secondary | ICD-10-CM

## 2021-10-18 DIAGNOSIS — Z01810 Encounter for preprocedural cardiovascular examination: Secondary | ICD-10-CM | POA: Diagnosis not present

## 2021-10-18 DIAGNOSIS — E876 Hypokalemia: Secondary | ICD-10-CM

## 2021-10-18 MED ORDER — POTASSIUM CHLORIDE CRYS ER 20 MEQ PO TBCR: 20.0000 meq | EXTENDED_RELEASE_TABLET | ORAL | 3 refills | Status: DC

## 2021-10-18 NOTE — Anesthesia Preprocedure Evaluation (Addendum)
Anesthesia Evaluation  Patient identified by MRN, date of birth, ID band Patient awake    Reviewed: Allergy & Precautions, NPO status , Patient's Chart, lab work & pertinent test results  History of Anesthesia Complications Negative for: history of anesthetic complications  Airway Mallampati: III  TM Distance: >3 FB Neck ROM: Full    Dental  (+) Dental Advisory Given, Teeth Intact   Pulmonary sleep apnea and Continuous Positive Airway Pressure Ventilation ,    Pulmonary exam normal breath sounds clear to auscultation       Cardiovascular hypertension (147/70 in preop, per pt "it's all over the place"), Pt. on medications + CAD  Normal cardiovascular exam Rhythm:Regular Rate:Normal  Echo 06/22/2016 - Procedure narrative: Transthoracic echocardiography. Image  quality was suboptimal. The study was technically difficult.  Intravenous contrast (Definity) was administered.  - Left ventricle: The cavity size was normal. Systolic function was  normal. The estimated ejection fraction was in the range of 55%  to 60%. Wall motion was normal; there were no regional wall  motion abnormalities. Left ventricular diastolic function  parameters were normal.  - Atrial septum: No defect or patent foramen ovale was identified.    Neuro/Psych PSYCHIATRIC DISORDERS Depression  Neuromuscular disease (polio)    GI/Hepatic GERD  Controlled and Medicated,(+) Hepatitis -, B  Endo/Other  Hypothyroidism   Renal/GU negative Renal ROS  negative genitourinary   Musculoskeletal  (+) Arthritis , Osteoarthritis,  Fibromyalgia - (tramadol ), narcotic dependentr knee DJD    Abdominal   Peds negative pediatric ROS (+)  Hematology negative hematology ROS (+) Hct 39.8, plt 173   Anesthesia Other Findings   Reproductive/Obstetrics negative OB ROS                          Anesthesia Physical Anesthesia Plan  ASA:  3  Anesthesia Plan: MAC, Regional and Spinal   Post-op Pain Management: Regional block* and Tylenol PO (pre-op)*   Induction:   PONV Risk Score and Plan: 2 and Propofol infusion and TIVA  Airway Management Planned: Natural Airway and Simple Face Mask  Additional Equipment: None  Intra-op Plan:   Post-operative Plan:   Informed Consent: I have reviewed the patients History and Physical, chart, labs and discussed the procedure including the risks, benefits and alternatives for the proposed anesthesia with the patient or authorized representative who has indicated his/her understanding and acceptance.       Plan Discussed with: CRNA  Anesthesia Plan Comments:        Anesthesia Quick Evaluation

## 2021-10-18 NOTE — Progress Notes (Signed)
Anesthesia Chart Review   Case: [id number] Date/Time: 10/25/21 0947   Procedure: RIGHT TOTAL KNEE ARTHROPLASTY (Right: Knee)   Anesthesia type: Spinal   Pre-op diagnosis: RIGHT KNEE DEGENERATIVE JOINT DISEASE   Location: [facility] ROOM 06 / [facility]   Surgeons: Melrose Nakayama, MD       DISCUSSION:81 y.o. never smoker with h/o GERD, HTN, sleep apnea, CAD, right knee djd scheduled for above procedure 10/25/21 with Dr. Melrose Nakayama.   Pt seen by cardiology 10/18/2021. Per OV note, "preoperative evaluation prior to knee replacement-patient has not had chest pain and has reasonable functional capacity.  Previous CTA showed mild nonobstructive coronary disease.  She may proceed without further cardiac evaluation."  Anticipate pt can proceed with planned procedure barring acute status change.   VS: BP (!) 162/81    Pulse 75    Temp 36.9 C (Oral)    Resp 16    Ht 5\' 3"  (1.6 m)    Wt 65.8 kg    SpO2 97%    BMI 25.69 kg/m   PROVIDERS: Carol Ada, MD is PCP   Kirk Ruths, MD is Cardiologist  LABS: Labs reviewed: Acceptable for surgery. (all labs ordered are listed, but only abnormal results are displayed)  Labs Reviewed  SURGICAL PCR SCREEN - Abnormal; Notable for the following components:      Result Value   Staphylococcus aureus POSITIVE (*)    All other components within normal limits  BASIC METABOLIC PANEL - Abnormal; Notable for the following components:   Potassium 3.2 (*)    Glucose, Bld 106 (*)    All other components within normal limits  CBC - Abnormal; Notable for the following components:   WBC 3.6 (*)    All other components within normal limits  TYPE AND SCREEN     IMAGES:   EKG: 10/18/2021 Rate 77 bpm  NSR Nonspecific ST and T wave abnormality   CV: Echo 06/22/2016 - Procedure narrative: Transthoracic echocardiography. Image    quality was suboptimal. The study was technically difficult.    Intravenous contrast (Definity) was administered.  - Left ventricle:  The cavity size was normal. Systolic function was    normal. The estimated ejection fraction was in the range of 55%    to 60%. Wall motion was normal; there were no regional wall    motion abnormalities. Left ventricular diastolic function    parameters were normal.  - Atrial septum: No defect or patent foramen ovale was identified.  Past Medical History:  Diagnosis Date   Arthritis    Blood transfusion without reported diagnosis    Cataract    Colitis    Complication of anesthesia    trouble waking up   Coronary artery disease    mild   Depression    Diverticulosis    Encounter for Zostavax administration 02/10/2008   Family history of adverse reaction to anesthesia    Son has [place] and vomiting   Fibromyalgia    GERD (gastroesophageal reflux disease)    Hemorrhoids, internal    Hepatitis    Hep B from medication no longer an issue   Hypertension    Immunization, tetanus-diphtheria 10/05/2002   Low BP    Neuromuscular disorder ([facility])    low endurance   Pneumococcal vaccination given 01/02/2009   Pneumonia    Polio    Post-polio syndrome    Sleep apnea    Cpap   Ulcer     Past Surgical History:  Procedure Laterality Date  ABDOMINAL HYSTERECTOMY     APPENDECTOMY     CHOLECYSTECTOMY     EYE SURGERY Bilateral    cataract    MEDICATIONS:  acetaminophen (TYLENOL) 500 MG tablet   aspirin EC 81 MG tablet   Carboxymeth-Glyc-Polysorb PF (REFRESH OPTIVE MEGA-3) 0.5-1-0.5 % SOLN   Cholecalciferol (VITAMIN D) 50 MCG (2000 UT) tablet   cholecalciferol (VITAMIN D3) 25 MCG (1000 UNIT) tablet   hydrochlorothiazide (MICROZIDE) 12.5 MG capsule   levothyroxine (SYNTHROID) 137 MCG tablet   losartan (COZAAR) 50 MG tablet   Melatonin 1 MG CAPS   melatonin 1 MG TABS tablet   Omega-3 1000 MG CAPS   potassium chloride SA (KLOR-CON M) 20 MEQ tablet   Respiratory Therapy Supplies (CARETOUCH 2 CPAP HOSE HANGER) MISC   RESTASIS 0.05 % ophthalmic emulsion   rosuvastatin (CRESTOR)  20 MG tablet   sertraline (ZOLOFT) 50 MG tablet   traMADol (ULTRAM) 50 MG tablet   No current facility-administered medications for this encounter.    Konrad Felix Ward, PA-C [facility] Pre-Surgical Testing [phone]

## 2021-10-18 NOTE — Patient Instructions (Signed)
Medication Instructions:   TAKE POTASSIUM CHLORIDE 20 MEQ ONE TABLET EVERY OTHER DAY  *If you need a refill on your cardiac medications before your next appointment, please call your pharmacy*   Lab Work:  Your physician recommends that you return for lab work Monday 10/24/21  If you have labs (blood work) drawn today and your tests are completely normal, you will receive your results only by: MyChart Message (if you have MyChart) OR A paper copy in the mail If you have any lab test that is abnormal or we need to change your treatment, we will call you to review the results.   Follow-Up: At [facility], you and your health needs are our priority.  As part of our continuing mission to provide you with exceptional heart care, we have created designated Provider Care Teams.  These Care Teams include your primary Cardiologist (physician) and Advanced Practice Providers (APPs -  Physician Assistants and Nurse Practitioners) who all work together to provide you with the care you need, when you need it.  We recommend signing up for the patient portal called "MyChart".  Sign up information is provided on this After Visit Summary.  MyChart is used to connect with patients for Virtual Visits (Telemedicine).  Patients are able to view lab/test results, encounter notes, upcoming appointments, etc.  Non-urgent messages can be sent to your provider as well.   To learn more about what you can do with MyChart, go to [email or web address].    Your next appointment:   6 month(s)  The format for your next appointment:   In Person  Provider:   Olga Millers, MD

## 2021-10-19 NOTE — Care Plan (Signed)
Ortho Bundle Case Management Note  Patient Details  Name: Erica Davidson MRN: [id number] Date of Birth: May 25, 1941  Spoke with patient prior to surgery. She will discharge to home with family to assist. Has rolling walker at home. HHPT referral to [facility] Home care and OPPT set up with [facility]. Patient and MD in agreement with plan. Choice offered.                   DME Arranged:    DME Agency:     HH Arranged:  PT HH Agency:  [facility]  Additional Comments: Please contact me with any questions of if this plan should need to change.  Shauna Hugh,  RN,BSN,MHA,CCM  [facility] Orthopaedic Specialist  [phone] 10/19/2021, 10:24 AM

## 2021-10-24 DIAGNOSIS — E876 Hypokalemia: Secondary | ICD-10-CM | POA: Diagnosis not present

## 2021-10-24 NOTE — H&P (Signed)
TOTAL KNEE ADMISSION H&P  Patient is being admitted for right total knee arthroplasty.  Subjective:  Chief Complaint:right knee pain.  HPI: Erica Davidson, 81 y.o. female, has a history of pain and functional disability in the right knee due to arthritis and has failed non-surgical conservative treatments for greater than 12 weeks to includeNSAID's and/or analgesics, corticosteriod injections, viscosupplementation injections, flexibility and strengthening excercises, supervised PT with diminished ADL's post treatment, use of assistive devices, weight reduction as appropriate, and activity modification.  Onset of symptoms was gradual, starting 5 years ago with gradually worsening course since that time. The patient noted no past surgery on the right knee(s).  Patient currently rates pain in the right knee(s) at 10 out of 10 with activity. Patient has night pain, worsening of pain with activity and weight bearing, pain that interferes with activities of daily living, crepitus, and joint swelling.  Patient has evidence of subchondral cysts, subchondral sclerosis, periarticular osteophytes, and joint space narrowing by imaging studies. There is no active infection.  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   OBSTRUCTIVE SLEEP APNEA 10/07/2009   Essential hypertension 10/07/2009   G E R D 10/07/2009   OSTEOPOROSIS 10/07/2009   COUGH 10/07/2009   Past Medical History:  Diagnosis Date   Arthritis    Blood transfusion without reported diagnosis    Cataract    Colitis    Complication of anesthesia    trouble waking up   Coronary artery disease    mild   Depression    Diverticulosis    Encounter for Zostavax administration 02/10/2008   Family history of adverse reaction to anesthesia    Son has n[place] and vomiting   Fibromyalgia    GERD (gastroesophageal reflux disease)    Hemorrhoids, internal    Hepatitis    Hep B from medication no longer an  issue   Hypertension    Immunization, tetanus-diphtheria 10/05/2002   Low BP    Neuromuscular disorder (HCC)    low endurance   Pneumococcal vaccination given 01/02/2009   Pneumonia    Polio    Post-polio syndrome    Sleep apnea    Cpap   Ulcer     Past Surgical History:  Procedure Laterality Date   ABDOMINAL HYSTERECTOMY     APPENDECTOMY     CHOLECYSTECTOMY     EYE SURGERY Bilateral    cataract    No current facility-administered medications for this encounter.   Current Outpatient Medications  Medication Sig Dispense Refill Last Dose   acetaminophen (TYLENOL) 500 MG tablet Take 500 mg by mouth every 6 (six) hours as needed for moderate pain.      aspirin EC 81 MG tablet Take 1 tablet (81 mg total) by mouth daily. Swallow whole. 90 tablet 3    Carboxymeth-Glyc-Polysorb PF (REFRESH OPTIVE MEGA-3) 0.5-1-0.5 % SOLN Place 1 drop into both eyes in the morning and at bedtime.      Cholecalciferol (VITAMIN D) 50 MCG (2000 UT) tablet Take 2,000 Units by mouth daily.      hydrochlorothiazide (MICROZIDE) 12.5 MG capsule TAKE 1 CAPSULE BY MOUTH DAILY 90 capsule 3    levothyroxine (SYNTHROID) 137 MCG tablet Take 137 mcg by mouth daily before breakfast.      losartan (COZAAR) 50 MG tablet Take 50 mg by mouth daily as needed (Take if SBP is > 150).      melatonin 1 MG TABS tablet Take 2 mg by mouth  at bedtime.      RESTASIS 0.05 % ophthalmic emulsion Place 1 drop into both eyes 2 (two) times daily.      rosuvastatin (CRESTOR) 20 MG tablet TAKE 1 TABLET BY MOUTH DAILY 90 tablet 3    sertraline (ZOLOFT) 50 MG tablet Take 50 mg by mouth daily.      traMADol (ULTRAM) 50 MG tablet Take 50 mg by mouth every 6 (six) hours as needed for severe pain.      cholecalciferol (VITAMIN D3) 25 MCG (1000 UNIT) tablet Take 1,000 mcg by mouth daily.      Melatonin 1 MG CAPS       Omega-3 1000 MG CAPS       potassium chloride SA (KLOR-CON M) 20 MEQ tablet Take 1 tablet (20 mEq total) by mouth every other  day. 45 tablet 3    Respiratory Therapy Supplies (CARETOUCH 2 CPAP HOSE HANGER) MISC       Allergies  Allergen Reactions   Zithromax [Azithromycin] Shortness Of Breath   Ciprofloxacin     REACTION: rash/itch   Iodine Other (See Comments)   Lisinopril Other (See Comments)   Loratadine-Pseudoephedrine Er     REACTION: tachycardia   Milnacipran Other (See Comments)   Penicillins     Questionable allergy per pt   Prednisone     high dose taper - gave pt high anxiety, was able to take depo-medrol 80 mg    Codeine Palpitations    Rapid heart rate    Social History   Tobacco Use   Smoking status: Never   Smokeless tobacco: Never  Substance Use Topics   Alcohol use: No    Family History  Problem Relation Age of Onset   Rheum arthritis Mother    Liver disease Father    Cancer Sister        Lung     Review of Systems  Musculoskeletal:  Positive for arthralgias.       Right knee  All other systems reviewed and are negative.  Objective:  Physical Exam Constitutional:      Appearance: Normal appearance.  HENT:     Head: Normocephalic and atraumatic.     Nose: Nose normal.     Mouth/Throat:     Pharynx: Oropharynx is clear.  Eyes:     Extraocular Movements: Extraocular movements intact.  Cardiovascular:     Rate and Rhythm: Normal rate and regular rhythm.  Pulmonary:     Effort: Pulmonary effort is normal.  Abdominal:     Palpations: Abdomen is soft.  Musculoskeletal:     Cervical back: Normal range of motion.     Comments: Right knee motion is about 5-115.  She has lateral and medial joint line pain with crepitation.  I do not feel an effusion.  Hip motion is good and straight leg raise is negative.  Sensation and motor function are intact in her feet with palpable pulses on both sides.    Skin:    General: Skin is warm and dry.  Neurological:     General: No focal deficit present.     Mental Status: She is alert and oriented to person, place, and time.   Psychiatric:        Mood and Affect: Mood normal.        Behavior: Behavior normal.        Thought Content: Thought content normal.        Judgment: Judgment normal.    Vital signs in last  24 hours:    Labs:   Estimated body mass index is 26.43 kg/m as calculated from the following:   Height as of 10/18/21: 5\' 3"  (1.6 m).   Weight as of 10/18/21: 67.7 kg.   Imaging Review Plain radiographs demonstrate severe degenerative joint disease of the right knee(s). The overall alignment isneutral. The bone quality appears to be good for age and reported activity level.      Assessment/Plan:  End stage primary arthritis, right knee   The patient history, physical examination, clinical judgment of the provider and imaging studies are consistent with end stage degenerative joint disease of the right knee(s) and total knee arthroplasty is deemed medically necessary. The treatment options including medical management, injection therapy arthroscopy and arthroplasty were discussed at length. The risks and benefits of total knee arthroplasty were presented and reviewed. The risks due to aseptic loosening, infection, stiffness, patella tracking problems, thromboembolic complications and other imponderables were discussed. The patient acknowledged the explanation, agreed to proceed with the plan and consent was signed. Patient is being admitted for inpatient treatment for surgery, pain control, PT, OT, prophylactic antibiotics, VTE prophylaxis, progressive ambulation and ADL's and discharge planning. The patient is planning to be discharged home with home health services   Patient's anticipated LOS is less than 2 midnights, meeting these requirements: - Younger than 63 - Lives within 1 hour of care - Has a competent adult at home to recover with post-op recover - NO history of  - Chronic pain requiring opiods  - Diabetes  - Coronary Artery Disease  - Heart failure  - Heart attack  -  Stroke  - DVT/VTE  - Cardiac arrhythmia  - Respiratory Failure/COPD  - Renal failure  - Anemia  - Advanced Liver disease

## 2021-10-25 ENCOUNTER — Other Ambulatory Visit: Payer: Self-pay

## 2021-10-25 ENCOUNTER — Encounter (HOSPITAL_COMMUNITY): Payer: Self-pay | Admitting: Orthopaedic Surgery

## 2021-10-25 ENCOUNTER — Encounter (HOSPITAL_COMMUNITY)
Admission: RE | Disposition: A | Discharge status: Home / Self Care | Payer: Self-pay | Source: Ambulatory Visit | Attending: Orthopaedic Surgery

## 2021-10-25 ENCOUNTER — Ambulatory Visit (HOSPITAL_BASED_OUTPATIENT_CLINIC_OR_DEPARTMENT_OTHER): Payer: Medicare PPO | Admitting: Anesthesiology

## 2021-10-25 ENCOUNTER — Ambulatory Visit (HOSPITAL_COMMUNITY)
Admission: RE | Admit: 2021-10-25 | Discharge: 2021-10-25 | Disposition: A | Discharge status: Home / Self Care | Payer: Medicare PPO | Source: Ambulatory Visit | Attending: Orthopaedic Surgery | Admitting: Orthopaedic Surgery

## 2021-10-25 ENCOUNTER — Ambulatory Visit (HOSPITAL_COMMUNITY): Payer: Medicare PPO | Admitting: Physician Assistant

## 2021-10-25 DIAGNOSIS — E039 Hypothyroidism, unspecified: Secondary | ICD-10-CM

## 2021-10-25 DIAGNOSIS — K219 Gastro-esophageal reflux disease without esophagitis: Secondary | ICD-10-CM | POA: Insufficient documentation

## 2021-10-25 DIAGNOSIS — F32A Depression, unspecified: Secondary | ICD-10-CM | POA: Insufficient documentation

## 2021-10-25 DIAGNOSIS — G14 Postpolio syndrome: Secondary | ICD-10-CM | POA: Insufficient documentation

## 2021-10-25 DIAGNOSIS — I10 Essential (primary) hypertension: Secondary | ICD-10-CM

## 2021-10-25 DIAGNOSIS — B1910 Unspecified viral hepatitis B without hepatic coma: Secondary | ICD-10-CM | POA: Insufficient documentation

## 2021-10-25 DIAGNOSIS — G4733 Obstructive sleep apnea (adult) (pediatric): Secondary | ICD-10-CM | POA: Diagnosis not present

## 2021-10-25 DIAGNOSIS — I2510 Atherosclerotic heart disease of native coronary artery without angina pectoris: Secondary | ICD-10-CM | POA: Diagnosis not present

## 2021-10-25 DIAGNOSIS — Z96651 Presence of right artificial knee joint: Secondary | ICD-10-CM | POA: Diagnosis not present

## 2021-10-25 DIAGNOSIS — M1711 Unilateral primary osteoarthritis, right knee: Secondary | ICD-10-CM | POA: Diagnosis present

## 2021-10-25 DIAGNOSIS — Z79899 Other long term (current) drug therapy: Secondary | ICD-10-CM | POA: Insufficient documentation

## 2021-10-25 DIAGNOSIS — G8918 Other acute postprocedural pain: Secondary | ICD-10-CM | POA: Diagnosis not present

## 2021-10-25 HISTORY — PX: TOTAL KNEE ARTHROPLASTY: SHX125

## 2021-10-25 LAB — BASIC METABOLIC PANEL
BUN/Creatinine Ratio: 22 (ref 12–28)
BUN: 17 mg/dL (ref 8–27)
CO2: 26 mmol/L (ref 20–29)
Calcium: 9.4 mg/dL (ref 8.7–10.3)
Chloride: 100 mmol/L (ref 96–106)
Creatinine, Ser: 0.77 mg/dL (ref 0.57–1.00)
Glucose: 88 mg/dL (ref 70–99)
Potassium: 4.3 mmol/L (ref 3.5–5.2)
Sodium: 143 mmol/L (ref 134–144)
eGFR: 79 mL/min/{1.73_m2} (ref >59)

## 2021-10-25 LAB — TYPE AND SCREEN
ABO/RH(D): O POS
Antibody Screen: NEGATIVE

## 2021-10-25 LAB — ABO/RH: ABO/RH(D): O POS

## 2021-10-25 SURGERY — ARTHROPLASTY, KNEE, TOTAL
Anesthesia: Monitor Anesthesia Care | Site: Knee | Laterality: Right

## 2021-10-25 MED ORDER — CHLORHEXIDINE GLUCONATE 0.12 % MT SOLN
15.0000 mL | Freq: Once | OROMUCOSAL | Status: AC
  Administered 2021-10-25: 15 mL via OROMUCOSAL

## 2021-10-25 MED ORDER — PHENYLEPHRINE HCL (PRESSORS) 10 MG/ML IV SOLN
INTRAVENOUS | Status: AC
  Filled 2021-10-25: qty 1

## 2021-10-25 MED ORDER — ASPIRIN EC 81 MG PO TBEC: 81.0000 mg | DELAYED_RELEASE_TABLET | Freq: Two times a day (BID) | ORAL | 0 refills | Status: AC

## 2021-10-25 MED ORDER — POVIDONE-IODINE 10 % EX SWAB
2.0000 "application " | Freq: Once | CUTANEOUS | Status: AC
  Administered 2021-10-25: 2 via TOPICAL

## 2021-10-25 MED ORDER — 0.9 % SODIUM CHLORIDE (POUR BTL) OPTIME
TOPICAL | Status: DC | PRN
  Administered 2021-10-25: 1000 mL

## 2021-10-25 MED ORDER — BUPIVACAINE HCL (PF) 0.5 % IJ SOLN
INTRAMUSCULAR | Status: DC | PRN
  Administered 2021-10-25: 30 mL via PERINEURAL

## 2021-10-25 MED ORDER — ONDANSETRON HCL 4 MG/2ML IJ SOLN: 4.0000 mg | Freq: Once | INTRAMUSCULAR | Status: DC | PRN

## 2021-10-25 MED ORDER — LACTATED RINGERS IV BOLUS
250.0000 mL | Freq: Once | INTRAVENOUS | Status: AC
  Administered 2021-10-25: 250 mL via INTRAVENOUS

## 2021-10-25 MED ORDER — PROPOFOL 500 MG/50ML IV EMUL
INTRAVENOUS | Status: DC | PRN
  Administered 2021-10-25: 50 ug/kg/min via INTRAVENOUS

## 2021-10-25 MED ORDER — DEXAMETHASONE SODIUM PHOSPHATE 10 MG/ML IJ SOLN
INTRAMUSCULAR | Status: AC
  Filled 2021-10-25: qty 1

## 2021-10-25 MED ORDER — SODIUM CHLORIDE (PF) 0.9 % IJ SOLN
INTRAMUSCULAR | Status: AC
  Filled 2021-10-25: qty 30

## 2021-10-25 MED ORDER — HYDROMORPHONE HCL 1 MG/ML IJ SOLN: 0.2500 mg | INTRAMUSCULAR | Status: DC | PRN

## 2021-10-25 MED ORDER — TRANEXAMIC ACID 1000 MG/10ML IV SOLN
2000.0000 mg | INTRAVENOUS | Status: DC
  Filled 2021-10-25: qty 20

## 2021-10-25 MED ORDER — VANCOMYCIN HCL IN DEXTROSE 1-5 GM/200ML-% IV SOLN
1000.0000 mg | INTRAVENOUS | Status: AC
  Administered 2021-10-25: 1000 mg via INTRAVENOUS
  Filled 2021-10-25: qty 200

## 2021-10-25 MED ORDER — ONDANSETRON HCL 4 MG/2ML IJ SOLN
INTRAMUSCULAR | Status: AC
  Filled 2021-10-25: qty 2

## 2021-10-25 MED ORDER — BUPIVACAINE-EPINEPHRINE 0.5% -1:200000 IJ SOLN
INTRAMUSCULAR | Status: DC | PRN
Start: 2021-10-25 — End: 2021-10-25

## 2021-10-25 MED ORDER — PROPOFOL 10 MG/ML IV BOLUS
INTRAVENOUS | Status: DC | PRN
  Administered 2021-10-25 (×3): 20 mg via INTRAVENOUS

## 2021-10-25 MED ORDER — OXYCODONE HCL 5 MG PO TABS: 5.0000 mg | ORAL_TABLET | Freq: Once | ORAL | Status: DC | PRN

## 2021-10-25 MED ORDER — CEFAZOLIN SODIUM-DEXTROSE 2-4 GM/100ML-% IV SOLN
2.0000 g | Freq: Once | INTRAVENOUS | Status: AC
  Administered 2021-10-25: 2 g via INTRAVENOUS

## 2021-10-25 MED ORDER — TRANEXAMIC ACID-NACL 1000-0.7 MG/100ML-% IV SOLN
1000.0000 mg | INTRAVENOUS | Status: AC
  Administered 2021-10-25: 1000 mg via INTRAVENOUS
  Filled 2021-10-25: qty 100

## 2021-10-25 MED ORDER — LACTATED RINGERS IV BOLUS
500.0000 mL | Freq: Once | INTRAVENOUS | Status: AC
  Administered 2021-10-25: 500 mL via INTRAVENOUS

## 2021-10-25 MED ORDER — DEXAMETHASONE SODIUM PHOSPHATE 10 MG/ML IJ SOLN
INTRAMUSCULAR | Status: DC | PRN
  Administered 2021-10-25: 10 mg via INTRAVENOUS

## 2021-10-25 MED ORDER — AMISULPRIDE (ANTIEMETIC) 5 MG/2ML IV SOLN: 10.0000 mg | Freq: Once | INTRAVENOUS | Status: DC | PRN

## 2021-10-25 MED ORDER — CEFAZOLIN SODIUM-DEXTROSE 2-4 GM/100ML-% IV SOLN: 2.0000 g | Freq: Four times a day (QID) | INTRAVENOUS | Status: DC

## 2021-10-25 MED ORDER — FENTANYL CITRATE PF 50 MCG/ML IJ SOSY
50.0000 ug | PREFILLED_SYRINGE | INTRAMUSCULAR | Status: DC
  Administered 2021-10-25: 50 ug via INTRAVENOUS
  Filled 2021-10-25: qty 2

## 2021-10-25 MED ORDER — BUPIVACAINE LIPOSOME 1.3 % IJ SUSP
INTRAMUSCULAR | Status: DC | PRN
  Administered 2021-10-25: 20 mL

## 2021-10-25 MED ORDER — BUPIVACAINE-EPINEPHRINE (PF) 0.25% -1:200000 IJ SOLN
INTRAMUSCULAR | Status: AC
  Filled 2021-10-25: qty 30

## 2021-10-25 MED ORDER — BUPIVACAINE LIPOSOME 1.3 % IJ SUSP
INTRAMUSCULAR | Status: AC
  Filled 2021-10-25: qty 20

## 2021-10-25 MED ORDER — PROPOFOL 10 MG/ML IV BOLUS
INTRAVENOUS | Status: AC
  Filled 2021-10-25: qty 20

## 2021-10-25 MED ORDER — TRANEXAMIC ACID-NACL 1000-0.7 MG/100ML-% IV SOLN: 1000.0000 mg | Freq: Once | INTRAVENOUS | Status: DC

## 2021-10-25 MED ORDER — CEFAZOLIN SODIUM-DEXTROSE 2-4 GM/100ML-% IV SOLN
INTRAVENOUS | Status: AC
  Filled 2021-10-25: qty 100

## 2021-10-25 MED ORDER — BUPIVACAINE-EPINEPHRINE 0.25% -1:200000 IJ SOLN
INTRAMUSCULAR | Status: DC | PRN
  Administered 2021-10-25: 30 mL

## 2021-10-25 MED ORDER — BUPIVACAINE IN DEXTROSE 0.75-8.25 % IT SOLN
INTRATHECAL | Status: DC | PRN
  Administered 2021-10-25: 1.6 mL via INTRATHECAL

## 2021-10-25 MED ORDER — TRANEXAMIC ACID 1000 MG/10ML IV SOLN
INTRAVENOUS | Status: DC | PRN
  Administered 2021-10-25: 2000 mg via TOPICAL

## 2021-10-25 MED ORDER — DEXAMETHASONE SODIUM PHOSPHATE 10 MG/ML IJ SOLN
INTRAMUSCULAR | Status: DC | PRN
  Administered 2021-10-25: 8 mg via INTRAVENOUS

## 2021-10-25 MED ORDER — LACTATED RINGERS IV SOLN: INTRAVENOUS | Status: DC

## 2021-10-25 MED ORDER — PROPOFOL 500 MG/50ML IV EMUL
INTRAVENOUS | Status: AC
  Filled 2021-10-25: qty 50

## 2021-10-25 MED ORDER — HYDROCODONE-ACETAMINOPHEN 5-325 MG PO TABS: 1.0000 | ORAL_TABLET | Freq: Four times a day (QID) | ORAL | 0 refills | Status: DC | PRN

## 2021-10-25 MED ORDER — MIDAZOLAM HCL 2 MG/2ML IJ SOLN
1.0000 mg | INTRAMUSCULAR | Status: DC
  Filled 2021-10-25: qty 2

## 2021-10-25 MED ORDER — ACETAMINOPHEN 500 MG PO TABS
1000.0000 mg | ORAL_TABLET | Freq: Once | ORAL | Status: AC
  Administered 2021-10-25: 1000 mg via ORAL
  Filled 2021-10-25: qty 2

## 2021-10-25 MED ORDER — PHENYLEPHRINE HCL-NACL 20-0.9 MG/250ML-% IV SOLN
INTRAVENOUS | Status: DC | PRN
  Administered 2021-10-25: 50 ug/min via INTRAVENOUS

## 2021-10-25 MED ORDER — METHOCARBAMOL 500 MG PO TABS
500.0000 mg | ORAL_TABLET | Freq: Four times a day (QID) | ORAL | Status: DC | PRN
Start: 2021-10-25 — End: 2021-10-25

## 2021-10-25 MED ORDER — BUPIVACAINE LIPOSOME 1.3 % IJ SUSP: 20.0000 mL | Freq: Once | INTRAMUSCULAR | Status: DC

## 2021-10-25 MED ORDER — LACTATED RINGERS IV SOLN
INTRAVENOUS | Status: DC
Start: 2021-10-25 — End: 2021-10-25

## 2021-10-25 MED ORDER — SODIUM CHLORIDE (PF) 0.9 % IJ SOLN
INTRAMUSCULAR | Status: DC | PRN
  Administered 2021-10-25: 30 mL

## 2021-10-25 MED ORDER — TIZANIDINE HCL 2 MG PO TABS
2.0000 mg | ORAL_TABLET | Freq: Four times a day (QID) | ORAL | 1 refills | Status: DC | PRN
Start: 2021-10-25 — End: 2022-06-21

## 2021-10-25 MED ORDER — METHOCARBAMOL 500 MG IVPB - SIMPLE MED: 500.0000 mg | Freq: Four times a day (QID) | INTRAVENOUS | Status: DC | PRN

## 2021-10-25 MED ORDER — ORAL CARE MOUTH RINSE: 15.0000 mL | Freq: Once | OROMUCOSAL | Status: AC

## 2021-10-25 MED ORDER — OXYCODONE HCL 5 MG/5ML PO SOLN: 5.0000 mg | Freq: Once | ORAL | Status: DC | PRN

## 2021-10-25 SURGICAL SUPPLY — 51 items
ATTUNE MED DOME PAT 38 KNEE (Knees) ×1 IMPLANT
ATTUNE PS FEM RT SZ 5 CEM KNEE (Femur) ×1 IMPLANT
ATTUNE PSRP INSR SZ5 6 KNEE (Insert) ×1 IMPLANT
BAG COUNTER SPONGE SURGICOUNT (BAG) ×2 IMPLANT
BAG DECANTER FOR FLEXI CONT (MISCELLANEOUS) ×2 IMPLANT
BAG ZIPLOCK 12X15 (MISCELLANEOUS) ×2 IMPLANT
BASE TIBIA ATTUNE KNEE SYS SZ6 (Knees) IMPLANT
BLADE SAGITTAL 25.0X1.19X90 (BLADE) ×2 IMPLANT
BLADE SAW SGTL 11.0X1.19X90.0M (BLADE) ×2 IMPLANT
BLADE SURG SZ10 CARB STEEL (BLADE) ×2 IMPLANT
BNDG ELASTIC 6X5.8 VLCR STR LF (GAUZE/BANDAGES/DRESSINGS) ×2 IMPLANT
BOOTIES KNEE HIGH SLOAN (MISCELLANEOUS) ×2 IMPLANT
BOWL SMART MIX CTS (DISPOSABLE) ×2 IMPLANT
CEMENT HV SMART SET (Cement) ×4 IMPLANT
COVER SURGICAL LIGHT HANDLE (MISCELLANEOUS) ×2 IMPLANT
CUFF TOURN SGL QUICK 34 (TOURNIQUET CUFF) ×1
CUFF TRNQT CYL 34X4.125X (TOURNIQUET CUFF) ×1 IMPLANT
DRAPE INCISE IOBAN 66X45 STRL (DRAPES) ×2 IMPLANT
DRAPE SHEET LG 3/4 BI-LAMINATE (DRAPES) ×2 IMPLANT
DRAPE U-SHAPE 47X51 STRL (DRAPES) ×2 IMPLANT
DRSG AQUACEL AG ADV 3.5X10 (GAUZE/BANDAGES/DRESSINGS) ×2 IMPLANT
DURAPREP 26ML APPLICATOR (WOUND CARE) ×4 IMPLANT
ELECT REM PT RETURN 15FT ADLT (MISCELLANEOUS) ×2 IMPLANT
GLOVE SRG 8 PF TXTR STRL LF DI (GLOVE) ×2 IMPLANT
GLOVE SURG ENC MOIS LTX SZ8 (GLOVE) ×4 IMPLANT
GLOVE SURG UNDER POLY LF SZ8 (GLOVE) ×2
GOWN STRL REUS W/TWL XL LVL3 (GOWN DISPOSABLE) ×4 IMPLANT
HANDPIECE INTERPULSE COAX TIP (DISPOSABLE) ×1
HOLDER FOLEY CATH W/STRAP (MISCELLANEOUS) ×1 IMPLANT
HOOD PEEL AWAY FLYTE STAYCOOL (MISCELLANEOUS) ×6 IMPLANT
KIT TURNOVER KIT A (KITS) IMPLANT
MANIFOLD NEPTUNE II (INSTRUMENTS) ×2 IMPLANT
NEEDLE HYPO 22GX1.5 SAFETY (NEEDLE) ×2 IMPLANT
NS IRRIG 1000ML POUR BTL (IV SOLUTION) ×2 IMPLANT
PACK TOTAL KNEE CUSTOM (KITS) ×2 IMPLANT
PAD ARMBOARD 7.5X6 YLW CONV (MISCELLANEOUS) ×2 IMPLANT
PROTECTOR NERVE ULNAR (MISCELLANEOUS) ×2 IMPLANT
SET HNDPC FAN SPRY TIP SCT (DISPOSABLE) ×1 IMPLANT
SPIKE FLUID TRANSFER (MISCELLANEOUS) ×4 IMPLANT
SUT ETHIBOND NAB CT1 #1 30IN (SUTURE) ×4 IMPLANT
SUT VIC AB 0 CT1 36 (SUTURE) ×2 IMPLANT
SUT VIC AB 2-0 CT1 27 (SUTURE) ×1
SUT VIC AB 2-0 CT1 TAPERPNT 27 (SUTURE) ×1 IMPLANT
SUT VICRYL AB 3-0 FS1 BRD 27IN (SUTURE) ×2 IMPLANT
SUT VLOC 180 0 24IN GS25 (SUTURE) ×2 IMPLANT
TIBIA ATTUNE KNEE SYS BASE SZ6 (Knees) ×2 IMPLANT
TRAY FOLEY MTR SLVR 14FR STAT (SET/KITS/TRAYS/PACK) ×1 IMPLANT
TRAY FOLEY MTR SLVR 16FR STAT (SET/KITS/TRAYS/PACK) IMPLANT
WATER STERILE IRR 1000ML POUR (IV SOLUTION) ×2 IMPLANT
WRAP KNEE MAXI GEL POST OP (GAUZE/BANDAGES/DRESSINGS) ×2 IMPLANT
YANKAUER SUCT BULB TIP NO VENT (SUCTIONS) ×2 IMPLANT

## 2021-10-25 NOTE — Anesthesia Procedure Notes (Signed)
Anesthesia Regional Block: Adductor canal block   Pre-Anesthetic Checklist: , timeout performed,  Correct Patient, Correct Site, Correct Laterality,  Correct Procedure, Correct Position, site marked,  Risks and benefits discussed,  Surgical consent,  Pre-op evaluation,  At surgeon's request and post-op pain management  Laterality: Right  Prep: Maximum Sterile Barrier Precautions used, chloraprep       Needles:  Injection technique: Single-shot  Needle Type: Echogenic Stimulator Needle     Needle Length: 9cm  Needle Gauge: 22     Additional Needles:   Procedures:,,,, ultrasound used (permanent image in chart),,    Narrative:  Start time: 10/25/2021 8:55 AM End time: 10/25/2021 9:00 AM Injection made incrementally with aspirations every 5 mL.  Performed by: Personally  Anesthesiologist: Lannie Fields, DO  Additional Notes: Monitors applied. No increased pain on injection. No increased resistance to injection. Injection made in 5cc increments. Good needle visualization. Patient tolerated procedure well.

## 2021-10-25 NOTE — Op Note (Signed)
PREOP DIAGNOSIS: DJD RIGHT KNEE POSTOP DIAGNOSIS: same PROCEDURE: RIGHT TKR ANESTHESIA: Spinal and MAC ATTENDING SURGEON: Hessie Dibble ASSISTANT: Loni Dolly PA  INDICATIONS FOR PROCEDURE: Erica Davidson is a 81 y.o. female who has struggled for a long time with pain due to degenerative arthritis of the right knee.  The patient has failed many conservative non-operative measures and at this point has pain which limits the ability to sleep and walk.  The patient is offered total knee replacement.  Informed operative consent was obtained after discussion of possible risks of anesthesia, infection, neurovascular injury, DVT, and death.  The importance of the post-operative rehabilitation protocol to optimize result was stressed extensively with the patient.  SUMMARY OF FINDINGS AND PROCEDURE:  Erica Davidson was taken to the operative suite where under the above anesthesia a right knee replacement was performed.  There were advanced degenerative changes and the bone quality was good.  We used the DePuy Attune system and placed size 5 femur, 6 tibia, 38 mm all polyethylene patella, and a size 6 mm spacer.  Loni Dolly PA-C assisted throughout and was invaluable to the completion of the case in that he helped retract and maintain exposure while I placed components.  He also helped close thereby minimizing OR time.  The patient was admitted for appropriate post-op care to include perioperative antibiotics and mechanical and pharmacologic measures for DVT prophylaxis.  DESCRIPTION OF PROCEDURE:  Erica Davidson was taken to the operative suite where the above anesthesia was applied.  The patient was positioned supine and prepped and draped in normal sterile fashion.  An appropriate time out was performed.  After the administration of vancomycin and kefzol pre-op antibiotic the leg was elevated and exsanguinated and a tourniquet inflated. A standard longitudinal incision was made on the anterior knee.   Dissection was carried down to the extensor mechanism.  All appropriate anti-infective measures were used including the pre-operative antibiotic, betadine impregnated drape, and closed hooded exhaust systems for each member of the surgical team.  A medial parapatellar incision was made in the extensor mechanism and the knee cap flipped and the knee flexed.  Some residual meniscal tissues were removed along with any remaining ACL/PCL tissue.  A guide was placed on the tibia and a flat cut was made on it's superior surface.  An intramedullary guide was placed in the femur and was utilized to make anterior and posterior cuts creating an appropriate flexion gap.  A second intramedullary guide was placed in the femur to make a distal cut properly balancing the knee with an extension gap equal to the flexion gap.  The three bones sized to the above mentioned sizes and the appropriate guides were placed and utilized.  A trial reduction was done and the knee easily came to full extension and the patella tracked well on flexion.  The trial components were removed and all bones were cleaned with pulsatile lavage and then dried thoroughly.  Cement was mixed and was pressurized onto the bones followed by placement of the aforementioned components.  Excess cement was trimmed and pressure was held on the components until the cement had hardened.  The tourniquet was deflated and a small amount of bleeding was controlled with cautery and pressure.  The knee was irrigated thoroughly.  The extensor mechanism was re-approximated with #1 ethibond in interrupted fashion.  The knee was flexed and the repair was solid.  The subcutaneous tissues were re-approximated with #0 and #2-0 vicryl and the skin  closed with a subcuticular stitch and steristrips.  A sterile dressing was applied.  Intraoperative fluids, EBL, and tourniquet time can be obtained from anesthesia records.  DISPOSITION:  The patient was taken to recovery room in stable  condition and scheduled to potentially go home same day depending on ability to walk and tolerate liquids.Erica Davidson Erica Davidson 10/25/2021, 11:47 AM

## 2021-10-25 NOTE — Anesthesia Postprocedure Evaluation (Signed)
Anesthesia Post Note  Patient: Erica Davidson  Procedure(s) Performed: RIGHT TOTAL KNEE ARTHROPLASTY (Right: Knee)     Patient location during evaluation: PACU Anesthesia Type: Regional, MAC and Spinal Level of consciousness: awake and alert, oriented and patient cooperative Pain management: pain level controlled Vital Signs Assessment: post-procedure vital signs reviewed and stable Respiratory status: spontaneous breathing, nonlabored ventilation and respiratory function stable Cardiovascular status: blood pressure returned to baseline and stable Postop Assessment: no apparent nausea or vomiting, no headache, no backache and spinal receding Anesthetic complications: no   No notable events documented.  Last Vitals:  Vitals:   10/25/21 1242 10/25/21 1245  BP:  (!) 161/77  Pulse: 75 71  Resp: 18 11  Temp:    SpO2: 92% 92%    Last Pain:  Vitals:   10/25/21 1245  TempSrc:   PainSc: 0-No pain                 Pervis Hocking

## 2021-10-25 NOTE — Transfer of Care (Signed)
Immediate Anesthesia Transfer of Care Note  Patient: Erica Davidson  Procedure(s) Performed: RIGHT TOTAL KNEE ARTHROPLASTY (Right: Knee)  Patient Location: PACU  Anesthesia Type:Spinal  Level of Consciousness: awake  Airway & Oxygen Therapy: Patient Spontanous Breathing and Patient connected to face mask oxygen  Post-op Assessment: Report given to RN and Post -op Vital signs reviewed and stable  Post vital signs: Reviewed and stable  Last Vitals:  Vitals Value Taken Time  BP 151/84 10/25/21 1212  Temp    Pulse 74 10/25/21 1213  Resp 10 10/25/21 1213  SpO2 100 % 10/25/21 1213  Vitals shown include unvalidated device data.  Last Pain:  Vitals:   10/25/21 0901  TempSrc:   PainSc: 0-No pain         Complications: No notable events documented.

## 2021-10-25 NOTE — Evaluation (Signed)
Physical Therapy Evaluation Patient Details Name: Erica Davidson MRN: [id number] DOB: [birth year] Today's Date: 10/25/2021  History of Present Illness  Pt is 81 yo female admitted 10/25/21 for R TKA.  Pt with hx including but not limited to post-polio syndrome, Osteoporosis, arthritis, fibromyalgia  Clinical Impression  Pt is s/p TKA resulting in the deficits listed below (see PT Problem List). At baseline, pt is independent with ADLs and ambulates short community distances with cane - some limitations due to post-polio syndrome.  PT asked to see pt for possible SDDC. Pt has DME and support at home.  Today, pt with excellent pain control, ROM, and quad activation. She ambulated 100' with min guard for safety.  Pt has ramp to enter home. Pt demonstrates safe gait & transfers in order to return home from PT perspective once discharged by MD.  While in hospital, will continue to benefit from PT for skilled therapy to advance mobility and exercises.          Recommendations for follow up therapy are one component of a multi-disciplinary discharge planning process, led by the attending physician.  Recommendations may be updated based on patient status, additional functional criteria and insurance authorization.  Follow Up Recommendations Follow physician's recommendations for discharge plan and follow up therapies    Assistance Recommended at Discharge Intermittent Supervision/Assistance  Patient can return home with the following  A little help with walking and/or transfers;A little help with bathing/dressing/bathroom;Help with stairs or ramp for entrance    Equipment Recommendations None recommended by PT  Recommendations for Other Services       Functional Status Assessment Patient has had a recent decline in their functional status and demonstrates the ability to make significant improvements in function in a reasonable and predictable amount of time.     Precautions / Restrictions  Precautions Precautions: Fall Restrictions Weight Bearing Restrictions: Yes RLE Weight Bearing: Weight bearing as tolerated      Mobility  Bed Mobility Overal bed mobility: Needs Assistance Bed Mobility: Supine to Sit, Sit to Supine     Supine to sit: Supervision Sit to supine: Supervision        Transfers Overall transfer level: Needs assistance Equipment used: Rolling walker (2 wheels) Transfers: Sit to/from Stand Sit to Stand: Min assist           General transfer comment: Cues for hand placement and light min A to rise    Ambulation/Gait Ambulation/Gait assistance: Min guard Gait Distance (Feet): 100 Feet Assistive device: Rolling walker (2 wheels) Gait Pattern/deviations: Step-through pattern Gait velocity: decreased     General Gait Details: Reciprocal gait without LOB; min cues for pushing RW  Stairs Stairs:  (has ramp)          Wheelchair Mobility    Modified Rankin (Stroke Patients Only)       Balance Overall balance assessment: Needs assistance Sitting-balance support: No upper extremity supported Sitting balance-Leahy Scale: Good     Standing balance support: Bilateral upper extremity supported, No upper extremity supported Standing balance-Leahy Scale: Fair Standing balance comment: RW to ambulate - steady; could static stand no AD                             Pertinent Vitals/Pain Pain Assessment Pain Assessment: No/denies pain    Home Living Family/patient expects to be discharged to:: Private residence Living Arrangements: Spouse/significant other Available Help at Discharge: Family;Available 24 hours/day Type of Home: House Home  Access: Ramped entrance       Home Layout: One level Home Equipment: BSC/3in1;Rolling Walker (2 wheels);Cane - single point;Wheelchair - manual      Prior Function               Mobility Comments: Normally walks with cane; only ambulates short community distances due to  post-polio; w/c for longer distance ADLs Comments: Independent with ADLs and light IADLs     Hand Dominance        Extremity/Trunk Assessment   Upper Extremity Assessment Upper Extremity Assessment: Overall WFL for tasks assessed    Lower Extremity Assessment Lower Extremity Assessment: LLE deficits/detail;RLE deficits/detail RLE Deficits / Details: ROM: knee 5 to 90 degrees, ankle and hip WFL; MMT: ankle 5/5, knee and hip 3/5 LLE Deficits / Details: ROM: WFL; MMT: 5/5    Cervical / Trunk Assessment Cervical / Trunk Assessment: Normal  Communication   Communication: HOH  Cognition Arousal/Alertness: Awake/alert Behavior During Therapy: WFL for tasks assessed/performed Overall Cognitive Status: Within Functional Limits for tasks assessed                                          General Comments General comments (skin integrity, edema, etc.): VSS  Educated on safe ice use, no pivots, car transfers, resting with leg straight, and TED hose during day. Also, encouraged walking every 1-2 hours during day. Educated on HEP with focus on mobility the first weeks. Discussed doing exercises within pain control and if pain increasing could decreased ROM, reps, and stop exercises as needed. Encouraged to perform quad sets and ankle pumps frequently for blood flow and to promote full knee extension.     Exercises Total Joint Exercises Ankle Circles/Pumps: AROM, Both, 5 reps, Supine Quad Sets: AROM, Both, 5 reps, Supine Hip ABduction/ADduction: AROM, Right, 5 reps, Supine Long Arc Quad: AROM, Right, 5 reps, Seated Knee Flexion: AROM, Right, 5 reps, Seated Goniometric ROM: R knee 5 to 90 degrees Other Exercises Other Exercises: Educated on exercises within pain control and AAROM techniques if needed   Assessment/Plan    PT Assessment Patient needs continued PT services  PT Problem List Decreased strength;Decreased mobility;Decreased safety awareness;Decreased range  of motion;Decreased activity tolerance;Decreased balance;Decreased knowledge of use of DME;Pain       PT Treatment Interventions DME instruction;Therapeutic activities;Modalities;Gait training;Therapeutic exercise;Patient/family education;Stair training;Balance training;Functional mobility training    PT Goals (Current goals can be found in the Care Plan section)  Acute Rehab PT Goals Patient Stated Goal: return home PT Goal Formulation: With patient/family Time For Goal Achievement: 11/08/21 Potential to Achieve Goals: Good    Frequency 7X/week     Co-evaluation               AM-PAC PT "6 Clicks" Mobility  Outcome Measure Help needed turning from your back to your side while in a flat bed without using bedrails?: None Help needed moving from lying on your back to sitting on the side of a flat bed without using bedrails?: A Little Help needed moving to and from a bed to a chair (including a wheelchair)?: A Little Help needed standing up from a chair using your arms (e.g., wheelchair or bedside chair)?: A Little Help needed to walk in hospital room?: A Little Help needed climbing 3-5 steps with a railing? : A Little 6 Click Score: 19    End of Session Equipment Utilized  During Treatment: Gait belt Activity Tolerance: Patient tolerated treatment well Patient left: in bed;with call bell/phone within reach;with family/visitor present Nurse Communication: Mobility status PT Visit Diagnosis: Other abnormalities of gait and mobility (R26.89);Muscle weakness (generalized) (M62.81)    Time: 5465-6812 PT Time Calculation (min) (ACUTE ONLY): 26 min   Charges:   PT Evaluation $PT Eval Low Complexity: 1 Low PT Treatments $Therapeutic Exercise: 8-22 mins        Anise Salvo, PT Acute Rehab Services Pager [phone] [facility] Rehab [phone]   Rayetta Humphrey 10/25/2021, 2:56 PM

## 2021-10-25 NOTE — Interval H&P Note (Signed)
History and Physical Interval Note:  10/25/2021 9:05 AM  Erica Davidson  has presented today for surgery, with the diagnosis of RIGHT KNEE DEGENERATIVE JOINT DISEASE.  The various methods of treatment have been discussed with the patient and family. After consideration of risks, benefits and other options for treatment, the patient has consented to  Procedure(s): RIGHT TOTAL KNEE ARTHROPLASTY (Right) as a surgical intervention.  The patient's history has been reviewed, patient examined, no change in status, stable for surgery.  I have reviewed the patient's chart and labs.  Questions were answered to the patient's satisfaction.     [name]

## 2021-10-25 NOTE — Anesthesia Procedure Notes (Signed)
Spinal  Patient location during procedure: OR Start time: 10/25/2021 10:07 AM End time: 10/25/2021 10:11 AM Reason for block: surgical anesthesia Staffing Performed: resident/CRNA  Resident/CRNA: Sharlette Dense, CRNA Preanesthetic Checklist Completed: patient identified, IV checked, site marked, risks and benefits discussed, surgical consent, monitors and equipment checked, pre-op evaluation and timeout performed Spinal Block Patient position: sitting Prep: DuraPrep and site prepped and draped Patient monitoring: heart rate, continuous pulse ox and blood pressure Approach: midline Location: L3-4 Injection technique: single-shot Needle Needle type: Sprotte and Pencan  Needle gauge: 24 G Needle length: 9 cm Assessment Sensory level: T4 Events: CSF return Additional Notes Kit expiration date 02/02/2023 and lot # [id number] Clear free flow CSF, negative heme, negative paresthesia Tolerated well and returned to supine position

## 2021-10-25 NOTE — Progress Notes (Signed)
AssistedDr. Beth Finucane with right, ultrasound guided, adductor canal block. Side rails up, monitors on throughout procedure. See vital signs in flow sheet. Tolerated Procedure well.  

## 2021-10-25 NOTE — Anesthesia Procedure Notes (Signed)
Date/Time: 10/25/2021 10:06 AM Performed by: Florene Route, CRNA Oxygen Delivery Method: Simple face mask

## 2021-10-25 NOTE — Interval H&P Note (Signed)
History and Physical Interval Note:  10/25/2021 9:06 AM  Erica Davidson  has presented today for surgery, with the diagnosis of RIGHT KNEE DEGENERATIVE JOINT DISEASE.  The various methods of treatment have been discussed with the patient and family. After consideration of risks, benefits and other options for treatment, the patient has consented to  Procedure(s): RIGHT TOTAL KNEE ARTHROPLASTY (Right) as a surgical intervention.  The patient's history has been reviewed, patient examined, no change in status, stable for surgery.  I have reviewed the patient's chart and labs.  Questions were answered to the patient's satisfaction.     [name]

## 2021-10-26 ENCOUNTER — Encounter (HOSPITAL_COMMUNITY): Payer: Self-pay | Admitting: Orthopaedic Surgery

## 2021-11-07 DIAGNOSIS — M6281 Muscle weakness (generalized): Secondary | ICD-10-CM | POA: Diagnosis not present

## 2021-11-07 DIAGNOSIS — Z96651 Presence of right artificial knee joint: Secondary | ICD-10-CM | POA: Diagnosis not present

## 2021-11-07 DIAGNOSIS — M1711 Unilateral primary osteoarthritis, right knee: Secondary | ICD-10-CM | POA: Diagnosis not present

## 2021-11-07 DIAGNOSIS — Z9889 Other specified postprocedural states: Secondary | ICD-10-CM | POA: Diagnosis not present

## 2021-11-07 DIAGNOSIS — M25661 Stiffness of right knee, not elsewhere classified: Secondary | ICD-10-CM | POA: Diagnosis not present

## 2021-11-14 DIAGNOSIS — Z96651 Presence of right artificial knee joint: Secondary | ICD-10-CM | POA: Diagnosis not present

## 2021-11-14 DIAGNOSIS — M25661 Stiffness of right knee, not elsewhere classified: Secondary | ICD-10-CM | POA: Diagnosis not present

## 2021-11-14 DIAGNOSIS — M6281 Muscle weakness (generalized): Secondary | ICD-10-CM | POA: Diagnosis not present

## 2021-11-17 DIAGNOSIS — M25661 Stiffness of right knee, not elsewhere classified: Secondary | ICD-10-CM | POA: Diagnosis not present

## 2021-11-17 DIAGNOSIS — Z96651 Presence of right artificial knee joint: Secondary | ICD-10-CM | POA: Diagnosis not present

## 2021-11-17 DIAGNOSIS — M6281 Muscle weakness (generalized): Secondary | ICD-10-CM | POA: Diagnosis not present

## 2021-11-21 DIAGNOSIS — K219 Gastro-esophageal reflux disease without esophagitis: Secondary | ICD-10-CM | POA: Diagnosis not present

## 2021-11-21 DIAGNOSIS — R1319 Other dysphagia: Secondary | ICD-10-CM | POA: Diagnosis not present

## 2021-11-21 DIAGNOSIS — E639 Nutritional deficiency, unspecified: Secondary | ICD-10-CM | POA: Diagnosis not present

## 2021-11-21 DIAGNOSIS — R110 Nausea: Secondary | ICD-10-CM | POA: Diagnosis not present

## 2021-11-23 DIAGNOSIS — M6281 Muscle weakness (generalized): Secondary | ICD-10-CM | POA: Diagnosis not present

## 2021-11-23 DIAGNOSIS — M25661 Stiffness of right knee, not elsewhere classified: Secondary | ICD-10-CM | POA: Diagnosis not present

## 2021-11-23 DIAGNOSIS — Z96651 Presence of right artificial knee joint: Secondary | ICD-10-CM | POA: Diagnosis not present

## 2021-11-25 DIAGNOSIS — M6281 Muscle weakness (generalized): Secondary | ICD-10-CM | POA: Diagnosis not present

## 2021-11-25 DIAGNOSIS — Z96651 Presence of right artificial knee joint: Secondary | ICD-10-CM | POA: Diagnosis not present

## 2021-11-25 DIAGNOSIS — M25661 Stiffness of right knee, not elsewhere classified: Secondary | ICD-10-CM | POA: Diagnosis not present

## 2021-11-29 DIAGNOSIS — M6281 Muscle weakness (generalized): Secondary | ICD-10-CM | POA: Diagnosis not present

## 2021-11-29 DIAGNOSIS — M25661 Stiffness of right knee, not elsewhere classified: Secondary | ICD-10-CM | POA: Diagnosis not present

## 2021-11-29 DIAGNOSIS — Z96651 Presence of right artificial knee joint: Secondary | ICD-10-CM | POA: Diagnosis not present

## 2021-12-07 ENCOUNTER — Other Ambulatory Visit: Payer: Self-pay

## 2021-12-07 ENCOUNTER — Emergency Department (HOSPITAL_BASED_OUTPATIENT_CLINIC_OR_DEPARTMENT_OTHER)
Admission: EM | Admit: 2021-12-07 | Discharge: 2021-12-07 | Disposition: A | Discharge status: Home / Self Care | Payer: Medicare PPO | Attending: Emergency Medicine | Admitting: Emergency Medicine

## 2021-12-07 ENCOUNTER — Encounter (HOSPITAL_BASED_OUTPATIENT_CLINIC_OR_DEPARTMENT_OTHER): Payer: Self-pay | Admitting: Obstetrics and Gynecology

## 2021-12-07 DIAGNOSIS — L03115 Cellulitis of right lower limb: Secondary | ICD-10-CM | POA: Diagnosis not present

## 2021-12-07 DIAGNOSIS — R000 Tachycardia, unspecified: Secondary | ICD-10-CM | POA: Diagnosis not present

## 2021-12-07 DIAGNOSIS — R112 Nausea with vomiting, unspecified: Secondary | ICD-10-CM | POA: Diagnosis not present

## 2021-12-07 DIAGNOSIS — R197 Diarrhea, unspecified: Secondary | ICD-10-CM | POA: Insufficient documentation

## 2021-12-07 DIAGNOSIS — R1110 Vomiting, unspecified: Secondary | ICD-10-CM

## 2021-12-07 DIAGNOSIS — Z7982 Long term (current) use of aspirin: Secondary | ICD-10-CM | POA: Diagnosis not present

## 2021-12-07 DIAGNOSIS — E876 Hypokalemia: Secondary | ICD-10-CM | POA: Diagnosis not present

## 2021-12-07 LAB — URINALYSIS, ROUTINE W REFLEX MICROSCOPIC
Bilirubin Urine: 0 — AB
Bilirubin Urine: NEGATIVE
Glucose, UA: 0 mg/dL — AB
Glucose, UA: NEGATIVE mg/dL
Hgb urine dipstick: 0 — AB
Hgb urine dipstick: NEGATIVE
Ketones, ur: 0 mg/dL — AB
Ketones, ur: NEGATIVE mg/dL
Leukocytes,Ua: 0 — AB
Leukocytes,Ua: NEGATIVE
Nitrite: 0 — AB
Nitrite: NEGATIVE
Protein, ur: 0 mg/dL — AB
Specific Gravity, Urine: 1.01 (ref 1.005–1.030)
Specific Gravity, Urine: 1.015 (ref 1.005–1.030)
pH: 5.5 (ref 5.0–8.0)
pH: 6 (ref 5.0–8.0)

## 2021-12-07 LAB — COMPREHENSIVE METABOLIC PANEL
ALT: 9 U/L (ref 0–44)
AST: 18 U/L (ref 15–41)
Albumin: 4 g/dL (ref 3.5–5.0)
Alkaline Phosphatase: 58 U/L (ref 38–126)
Anion gap: 11 (ref 5–15)
BUN: 17 mg/dL (ref 8–23)
CO2: 22 mmol/L (ref 22–32)
Calcium: 9.3 mg/dL (ref 8.9–10.3)
Chloride: 102 mmol/L (ref 98–111)
Creatinine, Ser: 0.71 mg/dL (ref 0.44–1.00)
GFR, Estimated: >60 mL/min (ref >60)
Glucose, Bld: 106 mg/dL — ABNORMAL HIGH (ref 70–99)
Potassium: 2.8 mmol/L — ABNORMAL LOW (ref 3.5–5.1)
Sodium: 135 mmol/L (ref 135–145)
Total Bilirubin: 0.6 mg/dL (ref 0.3–1.2)
Total Protein: 7.1 g/dL (ref 6.5–8.1)

## 2021-12-07 LAB — CBC
HCT: 33.2 % — ABNORMAL LOW (ref 36.0–46.0)
Hemoglobin: 10.6 g/dL — ABNORMAL LOW (ref 12.0–15.0)
MCH: 27 pg (ref 26.0–34.0)
MCHC: 31.9 g/dL (ref 30.0–36.0)
MCV: 84.5 fL (ref 80.0–100.0)
Platelets: 189 10*3/uL (ref 150–400)
RBC: 3.93 MIL/uL (ref 3.87–5.11)
RDW: 14.5 % (ref 11.5–15.5)
WBC: 2.4 10*3/uL — ABNORMAL LOW (ref 4.0–10.5)
nRBC: 0 % (ref 0.0–0.2)

## 2021-12-07 LAB — LIPASE, BLOOD: Lipase: 17 U/L (ref 11–51)

## 2021-12-07 MED ORDER — ONDANSETRON 4 MG PO TBDP: 4.0000 mg | ORAL_TABLET | Freq: Three times a day (TID) | ORAL | 0 refills | Status: DC | PRN

## 2021-12-07 MED ORDER — ACETAMINOPHEN 325 MG PO TABS
650.0000 mg | ORAL_TABLET | Freq: Once | ORAL | Status: AC
  Administered 2021-12-07: 650 mg via ORAL
  Filled 2021-12-07: qty 2

## 2021-12-07 MED ORDER — ONDANSETRON HCL 4 MG/2ML IJ SOLN
4.0000 mg | Freq: Once | INTRAMUSCULAR | Status: AC | PRN
  Administered 2021-12-07: 4 mg via INTRAVENOUS
  Filled 2021-12-07: qty 2

## 2021-12-07 MED ORDER — POTASSIUM CHLORIDE CRYS ER 20 MEQ PO TBCR
40.0000 meq | EXTENDED_RELEASE_TABLET | Freq: Once | ORAL | Status: AC
  Administered 2021-12-07: 40 meq via ORAL
  Filled 2021-12-07: qty 2

## 2021-12-07 MED ORDER — TRAMADOL HCL 50 MG PO TABS
50.0000 mg | ORAL_TABLET | Freq: Once | ORAL | Status: AC
  Administered 2021-12-07: 50 mg via ORAL
  Filled 2021-12-07: qty 1

## 2021-12-07 MED ORDER — PROCHLORPERAZINE EDISYLATE 10 MG/2ML IJ SOLN
10.0000 mg | Freq: Once | INTRAMUSCULAR | Status: AC
  Administered 2021-12-07: 10 mg via INTRAVENOUS
  Filled 2021-12-07: qty 2

## 2021-12-07 MED ORDER — SODIUM CHLORIDE 0.9 % IV BOLUS
1000.0000 mL | Freq: Once | INTRAVENOUS | Status: AC
  Administered 2021-12-07: 1000 mL via INTRAVENOUS

## 2021-12-07 MED ORDER — CEFAZOLIN SODIUM-DEXTROSE 1-4 GM/50ML-% IV SOLN
1.0000 g | Freq: Once | INTRAVENOUS | Status: AC
  Administered 2021-12-07: 1 g via INTRAVENOUS
  Filled 2021-12-07: qty 50

## 2021-12-07 NOTE — Discharge Instructions (Addendum)
Call your primary care doctor or specialist as discussed in the next 2-3 days.   Return immediately back to the ER if:  Your symptoms worsen within the next 12-24 hours. You develop new symptoms such as new fevers, persistent vomiting, new pain, shortness of breath, or new weakness or numbness, or if you have any other concerns.  

## 2021-12-07 NOTE — ED Notes (Signed)
Pt agreeable with d/c plan as discussed by provider- this nurse has verbally reinforced d/c instructions and provided written copy - pt acknowledges verbal understanding and denies any additional questions, concerns, needs.   ?

## 2021-12-07 NOTE — ED Triage Notes (Signed)
Patient reports to the ER for N/V/D since Monday. Patient has been taking zofran without relief. Patient reports she was sent here by her PCP for fluids.  ?

## 2021-12-07 NOTE — ED Notes (Signed)
Repeat Urinalysis ordered and collected to be re-tested for Erica Free, MD. ?

## 2021-12-07 NOTE — ED Provider Notes (Signed)
?[facility]PT ?Provider Note ? ? ?CSN: [id number] ?Arrival date & time: 12/07/21  1400 ? ?  ? ?History ? ?Chief Complaint  ?Patient presents with  ? Emesis  ? ? ?Erica Davidson is a 81 y.o. female. ? ?Patient presents to ER chief complaint of nausea vomiting diarrhea ongoing for 3 days now.  She states that her husband had same symptoms about 4 days ago and has since resolved.  She called her primary care doctor was given Zofran tablets which she has had a hard time swallowing due to her nausea and vomiting.  Complaining of generalized fatigue.  Sent by her doctor for IV fluids to the ER.  Otherwise denies any headache or chest pain no abdominal pain.  No fever no cough. ? ? ?  ? ?Home Medications ?Prior to Admission medications   ?Medication Sig Start Date End Date Taking? Authorizing Provider  ?ondansetron (ZOFRAN-ODT) 4 MG disintegrating tablet Take 1 tablet (4 mg total) by mouth every 8 (eight) hours as needed for up to 10 doses for nausea or vomiting. 12/07/21  Yes Cheryll CockayneHong, Rielly Corlett S, MD  ?acetaminophen (TYLENOL) 500 MG tablet Take 500 mg by mouth every 6 (six) hours as needed for moderate pain.    [provider]  ?aspirin EC 81 MG tablet Take 1 tablet (81 mg total) by mouth 2 (two) times daily after a meal. For 2 weeks then 1 x a day for 2 weeks for blood clot prevention. 10/25/21   Elodia FlorenceNida, Andrew, PA-C  ?Carboxymeth-Glyc-Polysorb PF (REFRESH OPTIVE MEGA-3) 0.5-1-0.5 % SOLN Place 1 drop into both eyes in the morning and at bedtime.    [provider]  ?Cholecalciferol (VITAMIN D) 50 MCG (2000 UT) tablet Take 2,000 Units by mouth daily.    [provider]  ?cholecalciferol (VITAMIN D3) 25 MCG (1000 UNIT) tablet Take 1,000 mcg by mouth daily.    [provider]  ?hydrochlorothiazide (MICROZIDE) 12.5 MG capsule TAKE 1 CAPSULE BY MOUTH DAILY 10/11/21   Lewayne Buntingrenshaw, Brian S, MD  ?HYDROcodone-acetaminophen (NORCO/VICODIN) 5-325 MG tablet Take 1-2 tablets by mouth every  6 (six) hours as needed for moderate pain or severe pain (post op pain). 10/25/21 10/25/22  Elodia FlorenceNida, Andrew, PA-C  ?levothyroxine (SYNTHROID) 137 MCG tablet Take 137 mcg by mouth daily before breakfast.    [provider]  ?losartan (COZAAR) 50 MG tablet Take 50 mg by mouth daily as needed (Take if SBP is > 150).    [provider]  ?Melatonin 1 MG CAPS  05/19/21   [provider]  ?melatonin 1 MG TABS tablet Take 2 mg by mouth at bedtime.    [provider]  ?Omega-3 1000 MG CAPS     [provider]  ?potassium chloride SA (KLOR-CON M) 20 MEQ tablet Take 1 tablet (20 mEq total) by mouth every other day. 10/18/21   Lewayne Buntingrenshaw, Brian S, MD  ?Respiratory Therapy Supplies (CARETOUCH 2 CPAP HOSE HANGER) MISC     [provider]  ?RESTASIS 0.05 % ophthalmic emulsion Place 1 drop into both eyes 2 (two) times daily. 12/23/19   [provider]  ?rosuvastatin (CRESTOR) 20 MG tablet TAKE 1 TABLET BY MOUTH DAILY 08/09/21   Lewayne Buntingrenshaw, Brian S, MD  ?sertraline (ZOLOFT) 50 MG tablet Take 50 mg by mouth daily.    [provider]  ?tiZANidine (ZANAFLEX) 2 MG tablet Take 1-2 tablets (2-4 mg total) by mouth every 6 (six) hours as needed for muscle spasms. 10/25/21 10/25/22  Nida,  Greig Castilla, PA-C  ?traMADol (ULTRAM) 50 MG tablet Take 50 mg by mouth every 6 (six) hours as needed for severe pain. 11/26/19   [provider]  ?   ? ?Allergies    ?Zithromax [azithromycin], Ciprofloxacin, Iodine, Lisinopril, Loratadine-pseudoephedrine er, Milnacipran, Penicillins, Prednisone, and Codeine   ? ?Review of Systems   ?Review of Systems  ?Constitutional:  Negative for fever.  ?HENT:  Negative for ear pain.   ?Eyes:  Negative for pain.  ?Respiratory:  Negative for cough.   ?Cardiovascular:  Negative for chest pain.  ?Gastrointestinal:  Negative for abdominal pain.  ?Genitourinary:  Negative for flank pain.  ?Musculoskeletal:  Negative for back pain.  ?Skin:  Negative for rash.   ?Neurological:  Negative for headaches.  ? ?Physical Exam ?Updated Vital Signs ?BP (!) 177/84   Pulse 73   Temp 98.6 ?F (37 ?C) (Oral)   Resp (!) 22   Ht 5\' 3"  (1.6 m)   Wt 63.5 kg   SpO2 100%   BMI 24.80 kg/m?  ?Physical Exam ?Constitutional:   ?   General: She is not in acute distress. ?   Appearance: Normal appearance.  ?HENT:  ?   Head: Normocephalic.  ?   Nose: Nose normal.  ?Eyes:  ?   Extraocular Movements: Extraocular movements intact.  ?Cardiovascular:  ?   Rate and Rhythm: Normal rate.  ?Pulmonary:  ?   Effort: Pulmonary effort is normal.  ?Abdominal:  ?   Tenderness: There is no abdominal tenderness. There is no guarding or rebound.  ?Musculoskeletal:     ?   General: Normal range of motion.  ?   Cervical back: Normal range of motion.  ?Skin: ?   Comments: Right knee mild warmth noted.  Mild cellulitis noted at incision site of the right knee.  Patient states that this is improving after she started antibiotics with her orthopedist.  ?Neurological:  ?   General: No focal deficit present.  ?   Mental Status: She is alert and oriented to person, place, and time. Mental status is at baseline.  ?   Cranial Nerves: No cranial nerve deficit.  ?   Motor: No weakness.  ?   Gait: Gait normal.  ? ? ?ED Results / Procedures / Treatments   ?Labs ?(all labs ordered are listed, but only abnormal results are displayed) ?Labs Reviewed  ?COMPREHENSIVE METABOLIC PANEL - Abnormal; Notable for the following components:  ?    Result Value  ? Potassium 2.8 (*)   ? Glucose, Bld 106 (*)   ? All other components within normal limits  ?CBC - Abnormal; Notable for the following components:  ? WBC 2.4 (*)   ? Hemoglobin 10.6 (*)   ? HCT 33.2 (*)   ? All other components within normal limits  ?URINALYSIS, ROUTINE W REFLEX MICROSCOPIC - Abnormal; Notable for the following components:  ? Glucose, UA 0 (*)   ? Hgb urine dipstick 0 (*)   ? Bilirubin Urine 0 (*)   ? Ketones, ur 0 (*)   ? Protein, ur 0 (*)   ? Nitrite 0 (*)   ?  Leukocytes,Ua 0 (*)   ? All other components within normal limits  ?URINALYSIS, ROUTINE W REFLEX MICROSCOPIC - Abnormal; Notable for the following components:  ? Protein, ur TRACE (*)   ? All other components within normal limits  ?LIPASE, BLOOD  ? ? ?EKG ?EKG Interpretation ? ?Date/Time:  Wednesday December 07 2021 15:14:34 EDT ?Ventricular Rate:  69 ?PR  Interval:  166 ?QRS Duration: 104 ?QT Interval:  428 ?QTC Calculation: 459 ?R Axis:   69 ?Text Interpretation: Sinus rhythm Borderline ST depression, diffuse leads Confirmed by Norman Clay ([id number]) on 12/07/2021 3:29:49 PM ? ?Radiology ?No results found. ? ?Procedures ?Procedures  ? ? ?Medications Ordered in ED ?Medications  ?sodium chloride 0.9 % bolus 1,000 mL (0 mLs Intravenous Stopped 12/07/21 1656)  ?ondansetron (ZOFRAN) injection 4 mg (4 mg Intravenous Given 12/07/21 1514)  ?potassium chloride SA (KLOR-CON M) CR tablet 40 mEq (40 mEq Oral Given 12/07/21 1554)  ?prochlorperazine (COMPAZINE) injection 10 mg (10 mg Intravenous Given 12/07/21 1619)  ?traMADol (ULTRAM) tablet 50 mg (50 mg Oral Given 12/07/21 1720)  ?acetaminophen (TYLENOL) tablet 650 mg (650 mg Oral Given 12/07/21 1721)  ?potassium chloride SA (KLOR-CON M) CR tablet 40 mEq (40 mEq Oral Given 12/07/21 1837)  ?ceFAZolin (ANCEF) IVPB 1 g/50 mL premix (0 g Intravenous Stopped 12/07/21 1911)  ? ? ?ED Course/ Medical Decision Making/ A&P ?  ?                        ?Medical Decision Making ?Amount and/or Complexity of Data Reviewed ?Labs: ordered. ? ?Risk ?OTC drugs. ?Prescription drug management. ? ? ?Chart review shows recent surgery of the right lower extremity last month. ? ?History also obtained from husband at bedside. ? ?Labs are sent.  Potassium found to be low at 2.8 and repleted here in the ER.  White count on the lower side as well hemoglobin level 10.6 mildly anemic.  Patient given a liter bolus of fluid resuscitation and Zofran.  Subsequently tolerating oral intake. ? ?Patient also states that she has had recent  cellulitis of the right knee and supposed be on Keflex but has missed several doses.  Given a dose of Ancef here in the ER.  Discharged home in stable condition not tolerating oral intake.  Given a prescription of Zo

## 2021-12-07 NOTE — ED Notes (Signed)
Patient provided with PO Medications and Ice Water. Patient to be reassessed at a Later Time. ?

## 2021-12-12 DIAGNOSIS — M25661 Stiffness of right knee, not elsewhere classified: Secondary | ICD-10-CM | POA: Diagnosis not present

## 2021-12-12 DIAGNOSIS — Z96651 Presence of right artificial knee joint: Secondary | ICD-10-CM | POA: Diagnosis not present

## 2021-12-12 DIAGNOSIS — M6281 Muscle weakness (generalized): Secondary | ICD-10-CM | POA: Diagnosis not present

## 2021-12-13 DIAGNOSIS — Z1159 Encounter for screening for other viral diseases: Secondary | ICD-10-CM | POA: Diagnosis not present

## 2021-12-13 DIAGNOSIS — K219 Gastro-esophageal reflux disease without esophagitis: Secondary | ICD-10-CM | POA: Diagnosis not present

## 2021-12-13 DIAGNOSIS — Z1389 Encounter for screening for other disorder: Secondary | ICD-10-CM | POA: Diagnosis not present

## 2021-12-13 DIAGNOSIS — Z0000 Encounter for general adult medical examination without abnormal findings: Secondary | ICD-10-CM | POA: Diagnosis not present

## 2021-12-13 DIAGNOSIS — E559 Vitamin D deficiency, unspecified: Secondary | ICD-10-CM | POA: Diagnosis not present

## 2021-12-13 DIAGNOSIS — E782 Mixed hyperlipidemia: Secondary | ICD-10-CM | POA: Diagnosis not present

## 2021-12-13 DIAGNOSIS — E039 Hypothyroidism, unspecified: Secondary | ICD-10-CM | POA: Diagnosis not present

## 2021-12-13 DIAGNOSIS — M797 Fibromyalgia: Secondary | ICD-10-CM | POA: Diagnosis not present

## 2021-12-13 DIAGNOSIS — I10 Essential (primary) hypertension: Secondary | ICD-10-CM | POA: Diagnosis not present

## 2021-12-14 DIAGNOSIS — K219 Gastro-esophageal reflux disease without esophagitis: Secondary | ICD-10-CM | POA: Diagnosis not present

## 2021-12-14 DIAGNOSIS — R110 Nausea: Secondary | ICD-10-CM | POA: Diagnosis not present

## 2021-12-16 DIAGNOSIS — M25661 Stiffness of right knee, not elsewhere classified: Secondary | ICD-10-CM | POA: Diagnosis not present

## 2021-12-16 DIAGNOSIS — M6281 Muscle weakness (generalized): Secondary | ICD-10-CM | POA: Diagnosis not present

## 2021-12-16 DIAGNOSIS — Z96651 Presence of right artificial knee joint: Secondary | ICD-10-CM | POA: Diagnosis not present

## 2021-12-20 DIAGNOSIS — Z96651 Presence of right artificial knee joint: Secondary | ICD-10-CM | POA: Diagnosis not present

## 2021-12-20 DIAGNOSIS — M6281 Muscle weakness (generalized): Secondary | ICD-10-CM | POA: Diagnosis not present

## 2021-12-20 DIAGNOSIS — M25661 Stiffness of right knee, not elsewhere classified: Secondary | ICD-10-CM | POA: Diagnosis not present

## 2021-12-23 DIAGNOSIS — M25661 Stiffness of right knee, not elsewhere classified: Secondary | ICD-10-CM | POA: Diagnosis not present

## 2021-12-23 DIAGNOSIS — Z96651 Presence of right artificial knee joint: Secondary | ICD-10-CM | POA: Diagnosis not present

## 2021-12-23 DIAGNOSIS — M6281 Muscle weakness (generalized): Secondary | ICD-10-CM | POA: Diagnosis not present

## 2021-12-27 DIAGNOSIS — Z96651 Presence of right artificial knee joint: Secondary | ICD-10-CM | POA: Diagnosis not present

## 2021-12-27 DIAGNOSIS — M25661 Stiffness of right knee, not elsewhere classified: Secondary | ICD-10-CM | POA: Diagnosis not present

## 2021-12-27 DIAGNOSIS — M6281 Muscle weakness (generalized): Secondary | ICD-10-CM | POA: Diagnosis not present

## 2021-12-29 DIAGNOSIS — M6281 Muscle weakness (generalized): Secondary | ICD-10-CM | POA: Diagnosis not present

## 2021-12-29 DIAGNOSIS — Z96651 Presence of right artificial knee joint: Secondary | ICD-10-CM | POA: Diagnosis not present

## 2021-12-29 DIAGNOSIS — M25661 Stiffness of right knee, not elsewhere classified: Secondary | ICD-10-CM | POA: Diagnosis not present

## 2022-01-02 DIAGNOSIS — Z96651 Presence of right artificial knee joint: Secondary | ICD-10-CM | POA: Diagnosis not present

## 2022-01-02 DIAGNOSIS — M25661 Stiffness of right knee, not elsewhere classified: Secondary | ICD-10-CM | POA: Diagnosis not present

## 2022-01-02 DIAGNOSIS — M6281 Muscle weakness (generalized): Secondary | ICD-10-CM | POA: Diagnosis not present

## 2022-01-05 DIAGNOSIS — M6281 Muscle weakness (generalized): Secondary | ICD-10-CM | POA: Diagnosis not present

## 2022-01-05 DIAGNOSIS — Z96651 Presence of right artificial knee joint: Secondary | ICD-10-CM | POA: Diagnosis not present

## 2022-01-05 DIAGNOSIS — M25661 Stiffness of right knee, not elsewhere classified: Secondary | ICD-10-CM | POA: Diagnosis not present

## 2022-01-16 DIAGNOSIS — Z9889 Other specified postprocedural states: Secondary | ICD-10-CM | POA: Diagnosis not present

## 2022-01-16 DIAGNOSIS — Z96651 Presence of right artificial knee joint: Secondary | ICD-10-CM | POA: Diagnosis not present

## 2022-01-24 DIAGNOSIS — R1310 Dysphagia, unspecified: Secondary | ICD-10-CM | POA: Diagnosis not present

## 2022-01-24 DIAGNOSIS — K219 Gastro-esophageal reflux disease without esophagitis: Secondary | ICD-10-CM | POA: Diagnosis not present

## 2022-02-13 DIAGNOSIS — E039 Hypothyroidism, unspecified: Secondary | ICD-10-CM | POA: Diagnosis not present

## 2022-02-13 DIAGNOSIS — E876 Hypokalemia: Secondary | ICD-10-CM | POA: Diagnosis not present

## 2022-02-13 DIAGNOSIS — G4733 Obstructive sleep apnea (adult) (pediatric): Secondary | ICD-10-CM | POA: Diagnosis not present

## 2022-02-13 DIAGNOSIS — E559 Vitamin D deficiency, unspecified: Secondary | ICD-10-CM | POA: Diagnosis not present

## 2022-03-30 DIAGNOSIS — H524 Presbyopia: Secondary | ICD-10-CM | POA: Diagnosis not present

## 2022-03-30 DIAGNOSIS — H353131 Nonexudative age-related macular degeneration, bilateral, early dry stage: Secondary | ICD-10-CM | POA: Diagnosis not present

## 2022-03-30 DIAGNOSIS — H04123 Dry eye syndrome of bilateral lacrimal glands: Secondary | ICD-10-CM | POA: Diagnosis not present

## 2022-04-06 DIAGNOSIS — N390 Urinary tract infection, site not specified: Secondary | ICD-10-CM | POA: Diagnosis not present

## 2022-04-06 DIAGNOSIS — N3281 Overactive bladder: Secondary | ICD-10-CM | POA: Diagnosis not present

## 2022-04-21 DIAGNOSIS — Z09 Encounter for follow-up examination after completed treatment for conditions other than malignant neoplasm: Secondary | ICD-10-CM | POA: Diagnosis not present

## 2022-04-21 DIAGNOSIS — Z96651 Presence of right artificial knee joint: Secondary | ICD-10-CM | POA: Diagnosis not present

## 2022-04-29 IMAGING — CT CT HEAD W/O CM
4 series · 16 of 47 positions shown, 18 images · non-contrast
Comparison: 12/25/2017

CLINICAL DATA: Fall with facial trauma.

EXAM:
CT HEAD WITHOUT CONTRAST
TECHNIQUE: Contiguous axial images were obtained from the base of the skull
through the vertex without intravenous contrast.

[Series 3: head wo · axial · 0.43mm/px · z∈[+498,+613]mm · 7 of 31 slices shown, 9 images]
[im 4/31  brain]
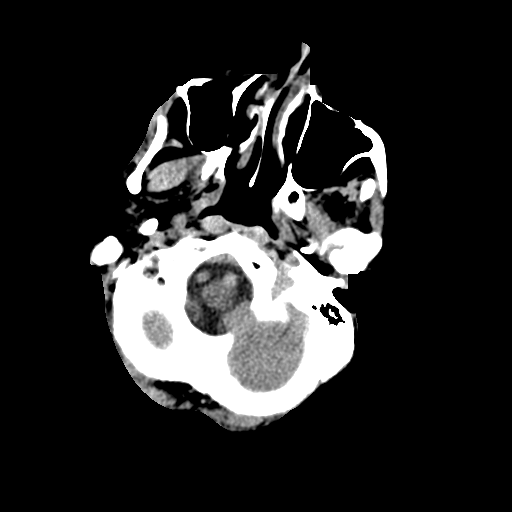
[im 4/31  bone]
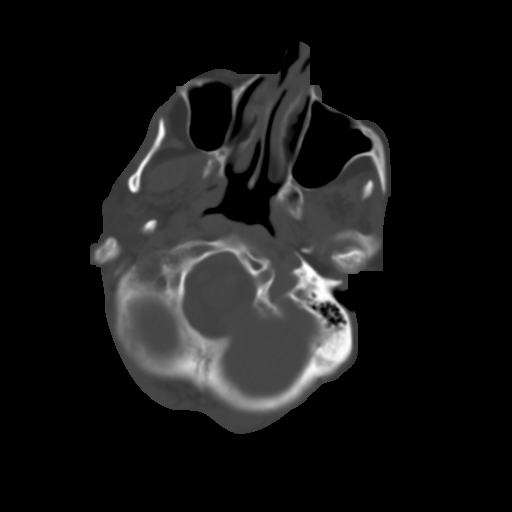
[im 8/31  brain]
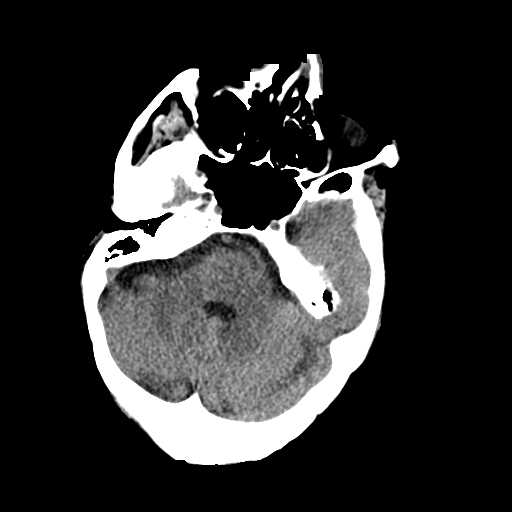
[im 12/31  brain]
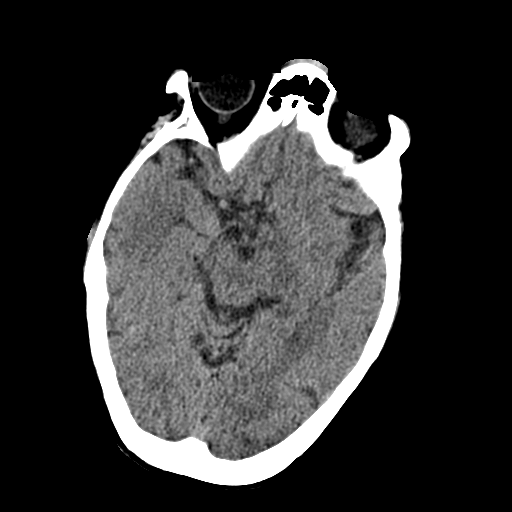
[im 16/31  brain]
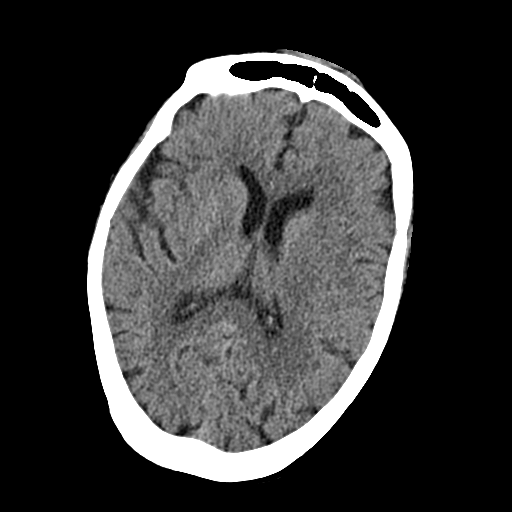
[im 19/31  brain]
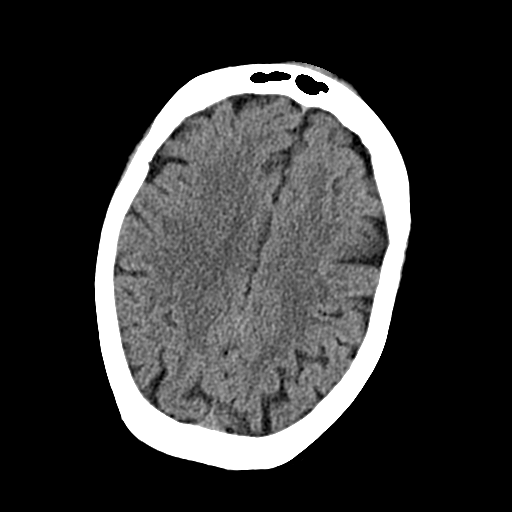
[im 19/31  bone]
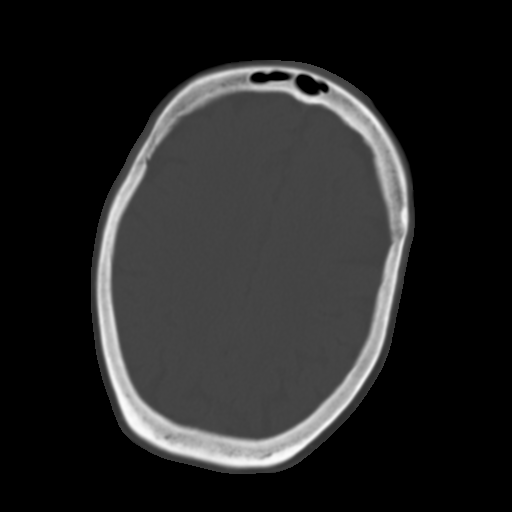
[im 23/31  brain]
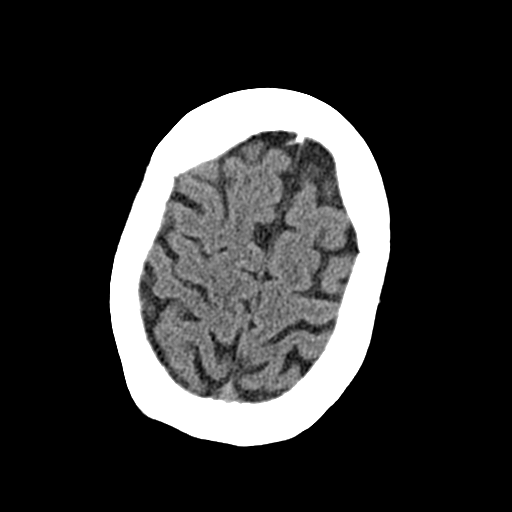
[im 27/31  brain]
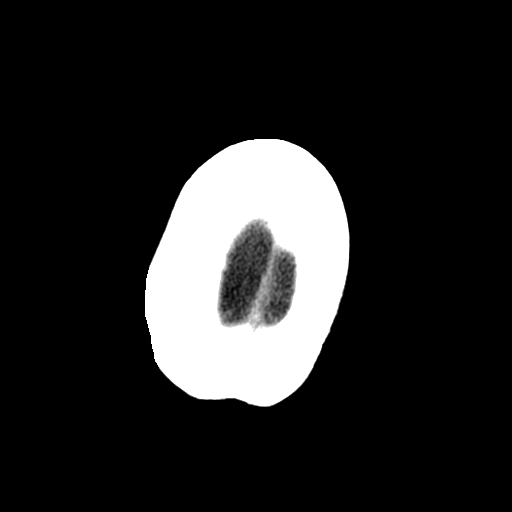

[Series 4: head bone · axial · 0.43mm/px · z∈[+497,+529]mm · 3 of 78 slices shown]
[im 8/78  bone]
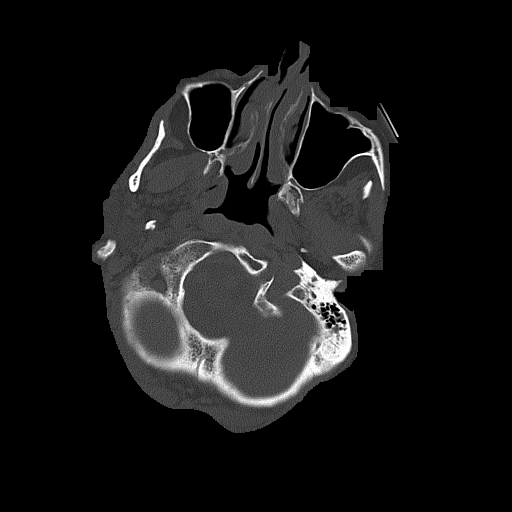
[im 16/78  bone]
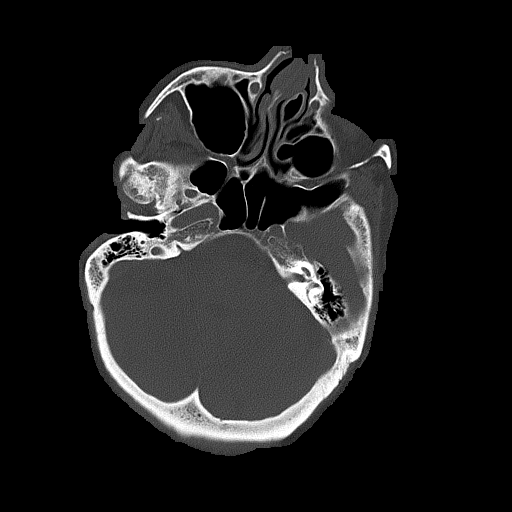
[im 24/78  bone]
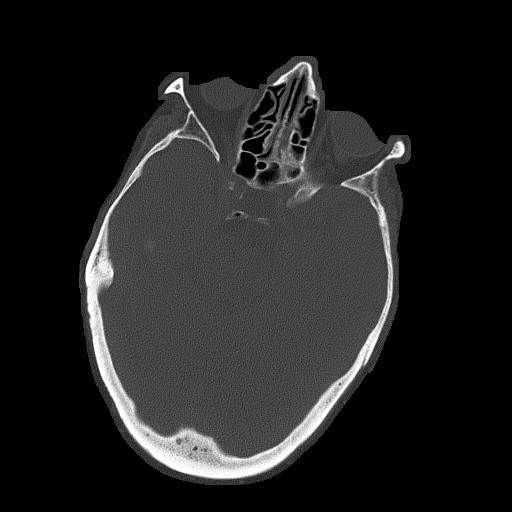

[Series 5: cor soft · coronal · 0.34mm/px · 3 of 75 slices shown]
[im 25/75  brain]
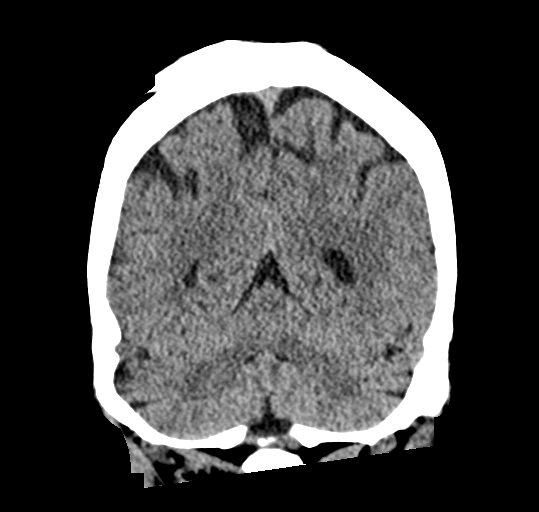
[im 33/75  brain]
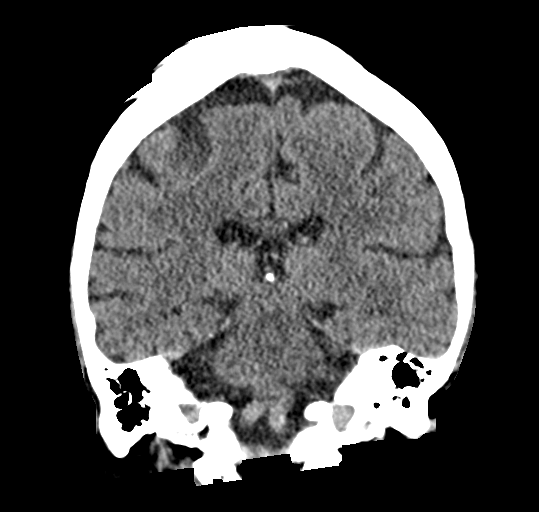
[im 42/75  brain]
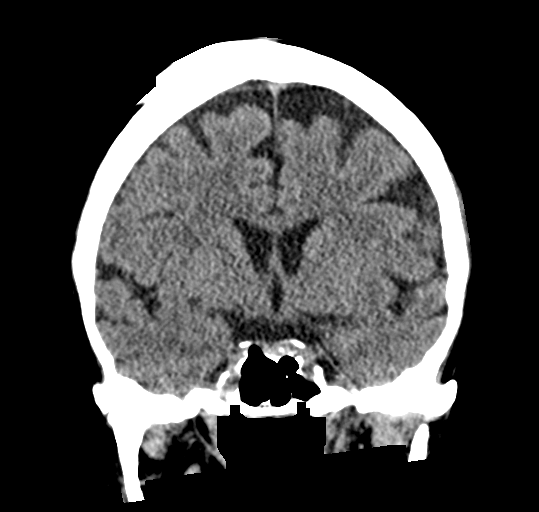

[Series 6: sag soft · sagittal · 0.30mm/px · 3 of 63 slices shown]
[im 21/63  brain]
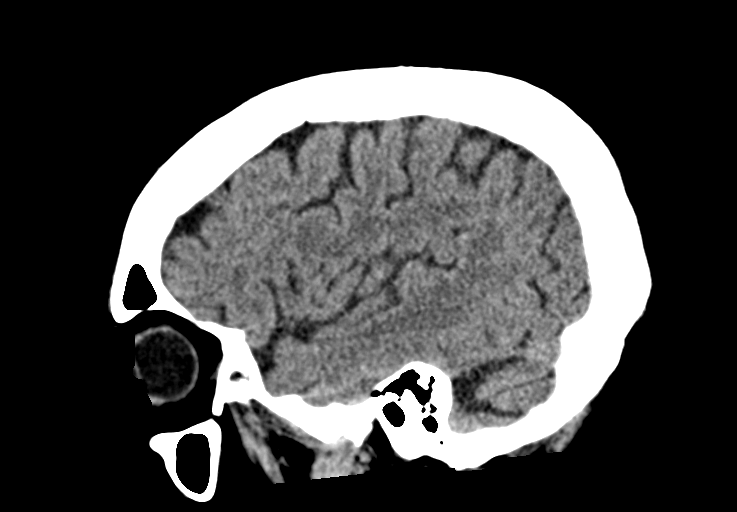
[im 32/63  brain]
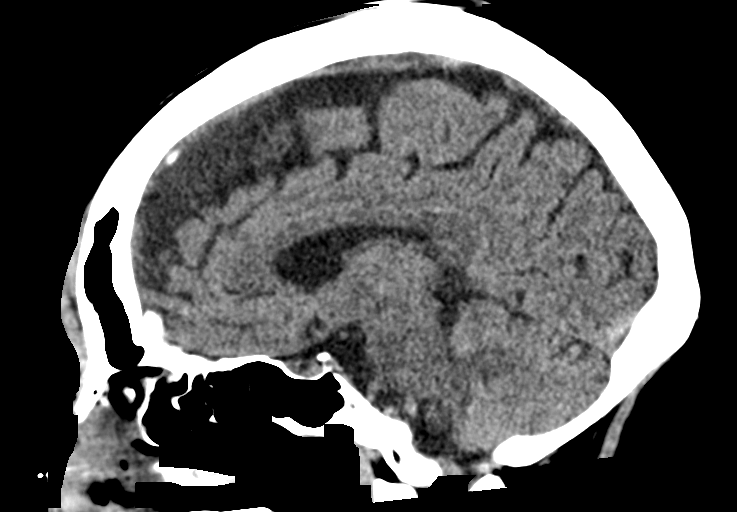
[im 42/63  brain]
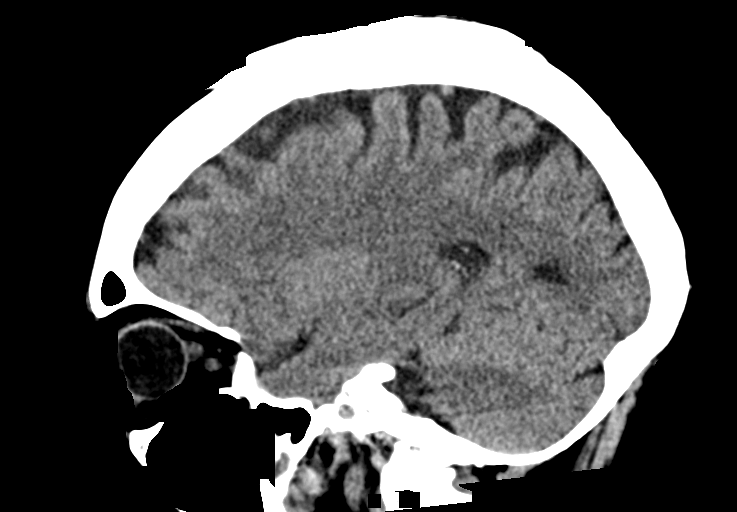

[16 of 47 positions shown; findings below may reference images not displayed]

FINDINGS: Brain: There is no evidence of an acute infarct, intracranial
hemorrhage, mass, midline shift, or extra-axial fluid collection.
The ventricles and sulci are within normal limits for age.

Vascular: Mild calcified atherosclerosis at the skull base. No
hyperdense vessel.

Skull: Mildly displaced nasal bone fractures.

Sinuses/Orbits: Trace chronic left mastoid effusion. Clear paranasal
sinuses. Chronic rightward nasal septal deviation. Bilateral
cataract extraction.

Other: Moderate-sized forehead hematoma.
IMPRESSION: 1. No evidence of acute intracranial abnormality.
2. Mildly displaced nasal bone fractures.
3. Forehead hematoma.

## 2022-04-29 IMAGING — DX DG HUMERUS 2V *L*
2 series · 2 of 2 positions shown · non-contrast
Comparison: Left shoulder radiographs 07/31/2018

CLINICAL DATA: Fall.  Left upper arm pain.

EXAM:
LEFT HUMERUS - 2+ VIEW

[humerus ap]
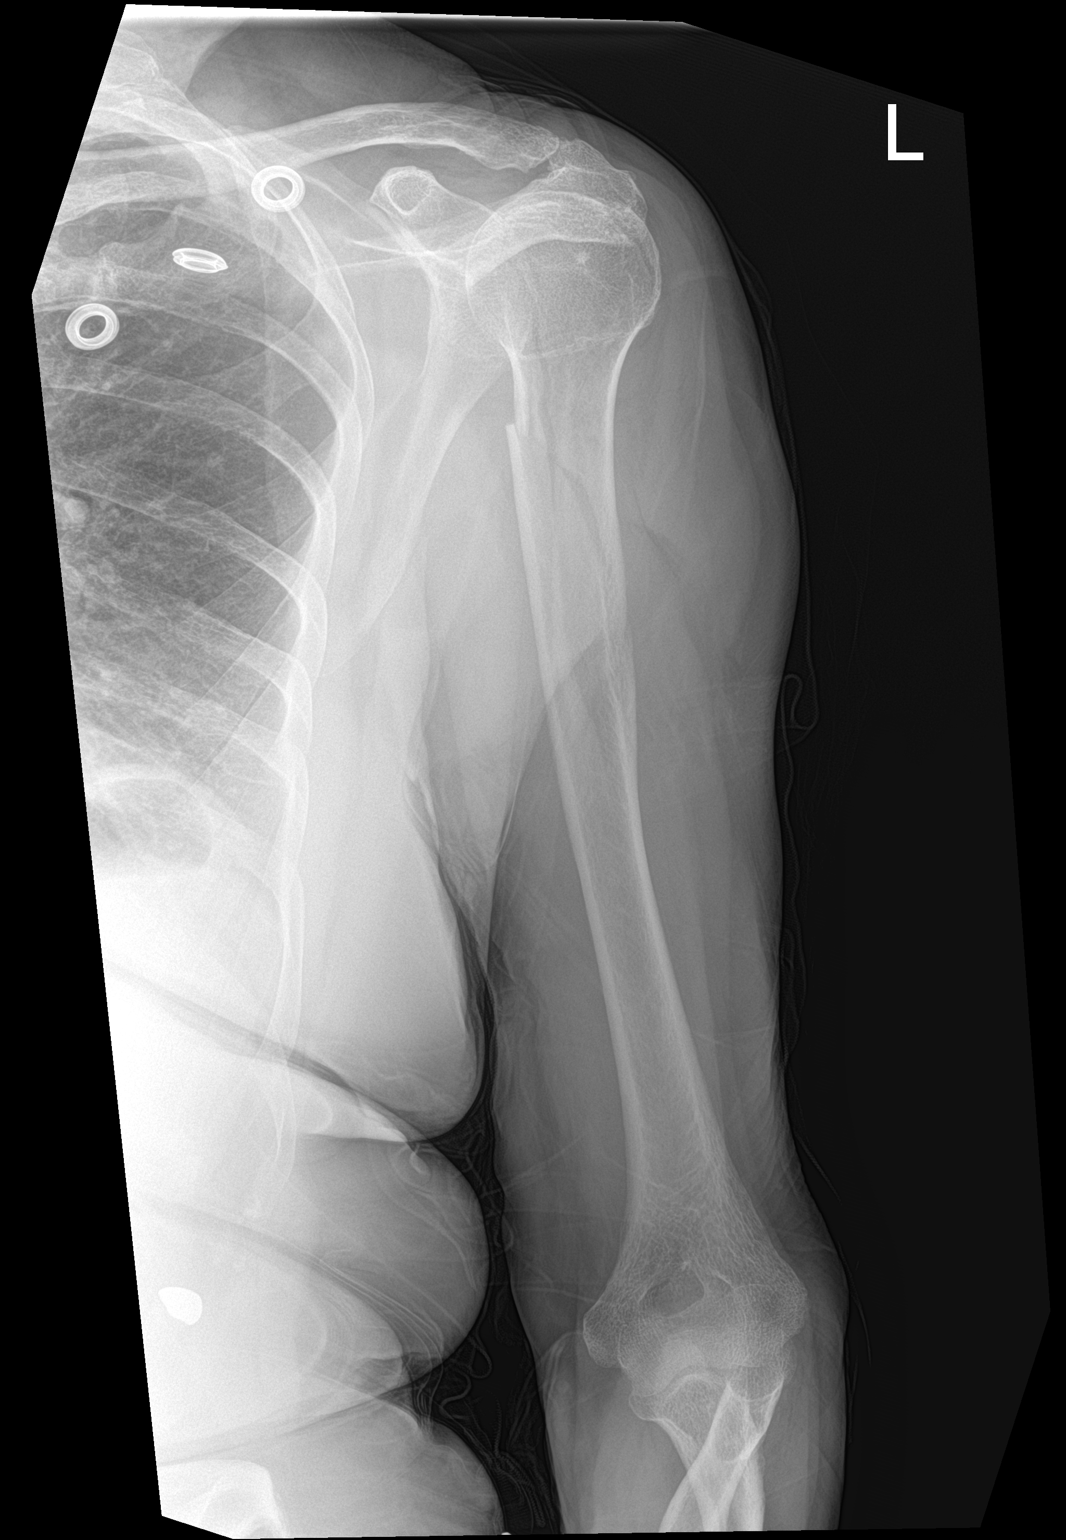

[humerus lat]
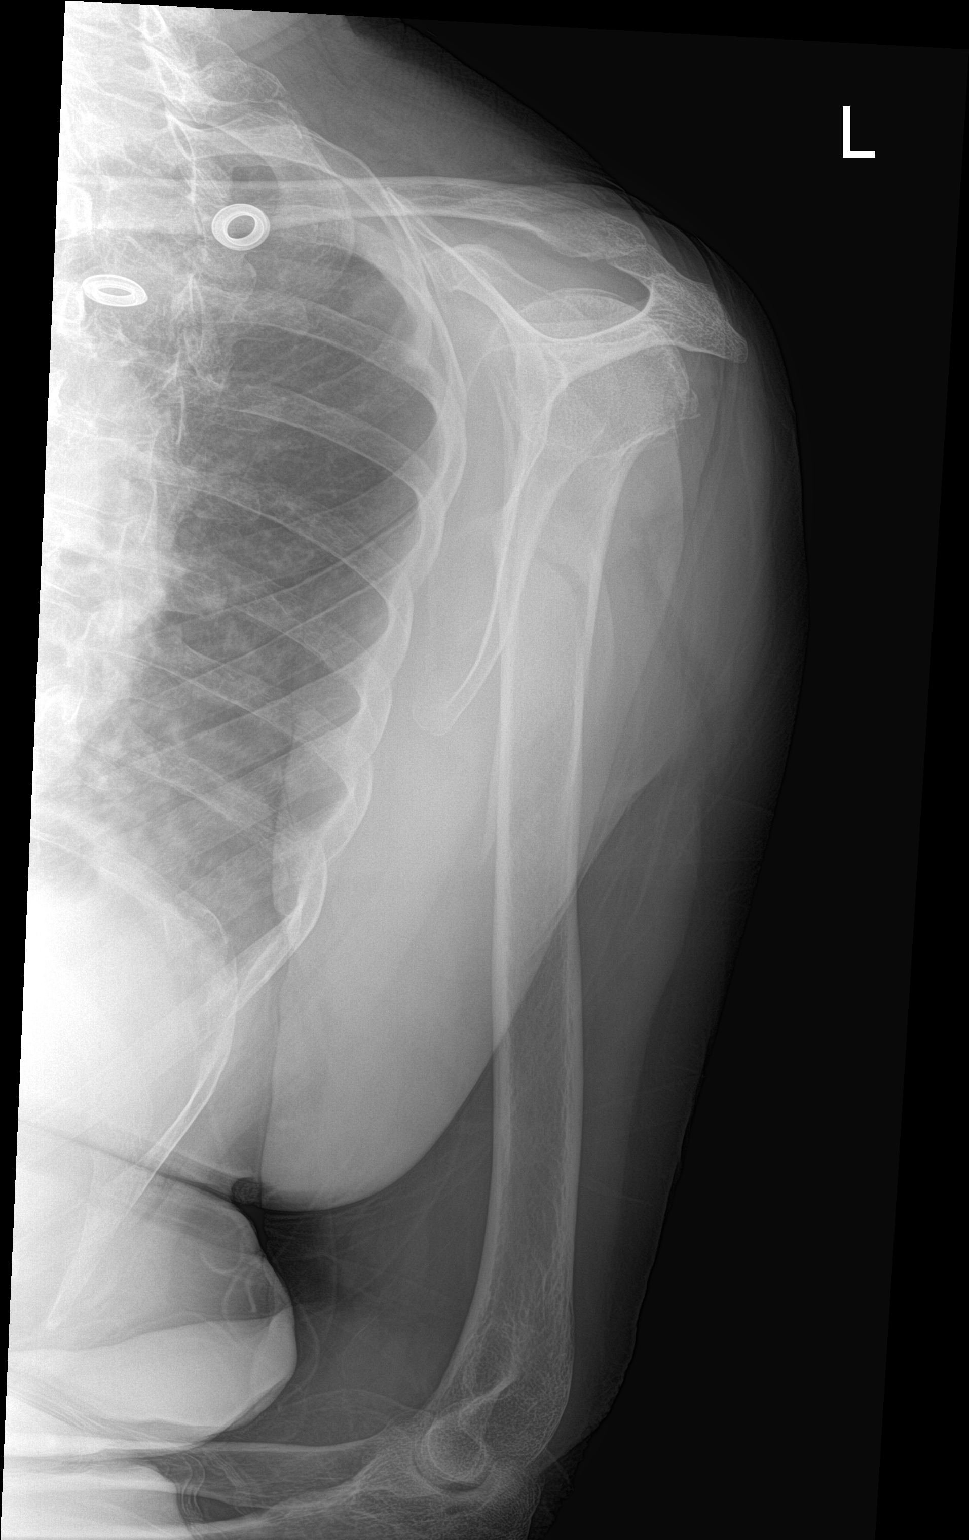

[2 of 2 positions shown; findings below may reference images not displayed]

FINDINGS: There is an oblique, mildly displaced fracture of the proximal
humeral shaft. The fracture extends into the humeral neck and head
without significant associated displacement. The humeral head
remains approximated with the glenoid. The distal humerus appears
intact.
IMPRESSION: Mildly displaced proximal humerus fracture.

## 2022-05-22 DIAGNOSIS — Z1231 Encounter for screening mammogram for malignant neoplasm of breast: Secondary | ICD-10-CM | POA: Diagnosis not present

## 2022-06-08 NOTE — Progress Notes (Signed)
HPI: Follow-up hypertension.  Previously followed by Dr. Graciela Husbands.  Apparently had previous evaluation to exclude hyperaldosteronism and pheochromocytoma. Echocardiogram October 2017 showed normal LV function. Cardiac CTA November 2018 showed calcium score of 29, less than 30% left main, less than 30% LAD. Aortic root mildly dilated at 38 mm. Renal Dopplers November 2018 showed no renal artery stenosis. Monitor November 2022 showed sinus rhythm with occasional PAC, brief PAT and rare PVC.  Since last seen, she denies increased dyspnea on exertion, orthopnea, PND, chest pain or syncope.  Occasional mild pedal edema.  She continues to have occasional problems with labile blood pressure.  At times her blood pressure will be very low in the 90s and she feels weak.  Current Outpatient Medications  Medication Sig Dispense Refill   acetaminophen (TYLENOL) 500 MG tablet Take 500 mg by mouth every 6 (six) hours as needed for moderate pain.     aspirin EC 81 MG tablet Take 1 tablet (81 mg total) by mouth 2 (two) times daily after a meal. For 2 weeks then 1 x a day for 2 weeks for blood clot prevention. 45 tablet 0   Carboxymeth-Glyc-Polysorb PF (REFRESH OPTIVE MEGA-3) 0.5-1-0.5 % SOLN Place 1 drop into both eyes in the morning and at bedtime.     Cholecalciferol (VITAMIN D) 50 MCG (2000 UT) tablet Take 2,000 Units by mouth daily.     DEXILANT 30 MG capsule DR Take 1 capsule by mouth daily.     hydrochlorothiazide (MICROZIDE) 12.5 MG capsule TAKE 1 CAPSULE BY MOUTH DAILY 90 capsule 3   levothyroxine (SYNTHROID) 137 MCG tablet Take 137 mcg by mouth daily before breakfast.     losartan (COZAAR) 50 MG tablet Take 50 mg by mouth daily as needed (Take if SBP is > 150).     melatonin 1 MG TABS tablet Take 2 mg by mouth at bedtime.     potassium chloride SA (KLOR-CON M) 20 MEQ tablet Take 1 tablet (20 mEq total) by mouth every other day. 45 tablet 3   Respiratory Therapy Supplies (CARETOUCH 2 CPAP HOSE HANGER) MISC       RESTASIS 0.05 % ophthalmic emulsion Place 1 drop into both eyes 2 (two) times daily.     rosuvastatin (CRESTOR) 20 MG tablet TAKE 1 TABLET BY MOUTH DAILY 90 tablet 3   sertraline (ZOLOFT) 50 MG tablet Take 50 mg by mouth daily.     traMADol (ULTRAM) 50 MG tablet Take 50 mg by mouth every 6 (six) hours as needed for severe pain.     No current facility-administered medications for this visit.     Past Medical History:  Diagnosis Date   Arthritis    Blood transfusion without reported diagnosis    Cataract    Colitis    Complication of anesthesia    trouble waking up   Coronary artery disease    mild   Depression    Diverticulosis    Encounter for Zostavax administration 02/10/2008   Family history of adverse reaction to anesthesia    Son has n[place] and vomiting   Fibromyalgia    GERD (gastroesophageal reflux disease)    Hemorrhoids, internal    Hepatitis    Hep B from medication no longer an issue   Hypertension    Immunization, tetanus-diphtheria 10/05/2002   Low BP    Neuromuscular disorder (HCC)    low endurance   Pneumococcal vaccination given 01/02/2009   Pneumonia    Polio  Post-polio syndrome    Sleep apnea    Cpap   Ulcer     Past Surgical History:  Procedure Laterality Date   ABDOMINAL HYSTERECTOMY     APPENDECTOMY     CHOLECYSTECTOMY     EYE SURGERY Bilateral    cataract   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   Procedure: RIGHT TOTAL KNEE ARTHROPLASTY;  Surgeon: Melrose Nakayama, MD;  Location: [facility];  Service: Orthopedics;  Laterality: Right;    Social History   Socioeconomic History   Marital status: Married    Spouse name: Will Bonnet   Number of children: 2   Years of education: College   Highest education level: Not on file  Occupational History   Occupation: Retired Pharmacist, hospital  Tobacco Use   Smoking status: Never    Passive exposure: Never   Smokeless tobacco: Never  Vaping Use   Vaping Use: Never used  Substance and Sexual  Activity   Alcohol use: No   Drug use: No   Sexual activity: Not Currently  Other Topics Concern   Not on file  Social History Narrative   Pt lives at home with spouse in a one story home.  Has one daughter.  Retired Pharmacist, hospital.  Education: college.    Caffeine Use: quit 09/2012   Patient is right handed   Social Determinants of Health   Financial Resource Strain: Not on file  Food Insecurity: Not on file  Transportation Needs: Not on file  Physical Activity: Not on file  Stress: Not on file  Social Connections: Not on file  Intimate Partner Violence: Not on file    Family History  Problem Relation Age of Onset   Rheum arthritis Mother    Liver disease Father    Cancer Sister        Lung    ROS: no fevers or chills, productive cough, hemoptysis, dysphasia, odynophagia, melena, hematochezia, dysuria, hematuria, rash, seizure activity, orthopnea, PND, pedal edema, claudication. Remaining systems are negative.  Physical Exam: Well-developed well-nourished in no acute distress.  Skin is warm and dry.  HEENT is normal.  Neck is supple.  Chest is clear to auscultation with normal expansion.  Cardiovascular exam is regular rate and rhythm.  Abdominal exam nontender or distended. No masses palpated. Extremities show no edema. neuro grossly intact  ECG- personally reviewed  A/P  1 coronary artery disease-nonobstructive on prior CTA.  No chest pain.  Continue medical therapy including aspirin and statin.  2 hypertension-patient continues to have problems with labile blood pressure.  She will continue HCTZ at present dose.  Note her potassium was low in the past but she states that this was recently checked by her primary care physician and is now normal.  She will take her losartan if her systolic is above 710.  As outlined above she has had work-up for secondary hypertension in the past.  3 hyperlipidemia-continue statin.  4 palpitations-no recent symptoms.  Will consider  addition of beta-blockade in the future as necessary.  Kirk Ruths, MD

## 2022-06-13 DIAGNOSIS — E039 Hypothyroidism, unspecified: Secondary | ICD-10-CM | POA: Diagnosis not present

## 2022-06-13 DIAGNOSIS — Z23 Encounter for immunization: Secondary | ICD-10-CM | POA: Diagnosis not present

## 2022-06-13 DIAGNOSIS — G4733 Obstructive sleep apnea (adult) (pediatric): Secondary | ICD-10-CM | POA: Diagnosis not present

## 2022-06-13 DIAGNOSIS — M6281 Muscle weakness (generalized): Secondary | ICD-10-CM | POA: Diagnosis not present

## 2022-06-13 DIAGNOSIS — E782 Mixed hyperlipidemia: Secondary | ICD-10-CM | POA: Diagnosis not present

## 2022-06-13 DIAGNOSIS — I10 Essential (primary) hypertension: Secondary | ICD-10-CM | POA: Diagnosis not present

## 2022-06-13 DIAGNOSIS — F5101 Primary insomnia: Secondary | ICD-10-CM | POA: Diagnosis not present

## 2022-06-21 ENCOUNTER — Ambulatory Visit: Payer: Medicare PPO | Attending: Cardiology | Admitting: Cardiology

## 2022-06-21 ENCOUNTER — Encounter: Payer: Self-pay | Admitting: Cardiology

## 2022-06-21 VITALS — BP 148/82 | HR 77 | Ht 63.0 in | Wt 144.0 lb

## 2022-06-21 DIAGNOSIS — R0989 Other specified symptoms and signs involving the circulatory and respiratory systems: Secondary | ICD-10-CM

## 2022-06-21 DIAGNOSIS — E785 Hyperlipidemia, unspecified: Secondary | ICD-10-CM | POA: Diagnosis not present

## 2022-06-21 DIAGNOSIS — I2510 Atherosclerotic heart disease of native coronary artery without angina pectoris: Secondary | ICD-10-CM

## 2022-06-21 NOTE — Patient Instructions (Signed)
  Follow-Up: At [facility], you and your health needs are our priority.  As part of our continuing mission to provide you with exceptional heart care, we have created designated Provider Care Teams.  These Care Teams include your primary Cardiologist (physician) and Advanced Practice Providers (APPs -  Physician Assistants and Nurse Practitioners) who all work together to provide you with the care you need, when you need it.  We recommend signing up for the patient portal called "MyChart".  Sign up information is provided on this After Visit Summary.  MyChart is used to connect with patients for Virtual Visits (Telemedicine).  Patients are able to view lab/test results, encounter notes, upcoming appointments, etc.  Non-urgent messages can be sent to your provider as well.   To learn more about what you can do with MyChart, go to [email or web address].    Your next appointment:   6 month(s)  The format for your next appointment:   In Person  Provider:   Brian Crenshaw, MD    

## 2022-07-31 DIAGNOSIS — L570 Actinic keratosis: Secondary | ICD-10-CM | POA: Diagnosis not present

## 2022-07-31 DIAGNOSIS — L821 Other seborrheic keratosis: Secondary | ICD-10-CM | POA: Diagnosis not present

## 2022-07-31 DIAGNOSIS — D225 Melanocytic nevi of trunk: Secondary | ICD-10-CM | POA: Diagnosis not present

## 2022-08-16 DIAGNOSIS — R3915 Urgency of urination: Secondary | ICD-10-CM | POA: Diagnosis not present

## 2022-08-16 DIAGNOSIS — R350 Frequency of micturition: Secondary | ICD-10-CM | POA: Diagnosis not present

## 2022-08-16 DIAGNOSIS — N3946 Mixed incontinence: Secondary | ICD-10-CM | POA: Diagnosis not present

## 2022-08-31 ENCOUNTER — Encounter: Payer: Self-pay | Admitting: Family Medicine

## 2022-08-31 ENCOUNTER — Ambulatory Visit
Admission: RE | Admit: 2022-08-31 | Discharge: 2022-08-31 | Disposition: A | Discharge status: Home / Self Care | Payer: Medicare PPO | Source: Ambulatory Visit | Attending: Family Medicine | Admitting: Family Medicine

## 2022-08-31 ENCOUNTER — Other Ambulatory Visit: Payer: Self-pay | Admitting: Family Medicine

## 2022-08-31 DIAGNOSIS — S0512XA Contusion of eyeball and orbital tissues, left eye, initial encounter: Secondary | ICD-10-CM

## 2022-08-31 DIAGNOSIS — R55 Syncope and collapse: Secondary | ICD-10-CM | POA: Diagnosis not present

## 2022-08-31 DIAGNOSIS — S0033XA Contusion of nose, initial encounter: Secondary | ICD-10-CM

## 2022-08-31 DIAGNOSIS — S0990XA Unspecified injury of head, initial encounter: Secondary | ICD-10-CM

## 2022-08-31 DIAGNOSIS — S0083XA Contusion of other part of head, initial encounter: Secondary | ICD-10-CM | POA: Diagnosis not present

## 2022-08-31 DIAGNOSIS — S0003XA Contusion of scalp, initial encounter: Secondary | ICD-10-CM | POA: Diagnosis not present

## 2022-10-04 ENCOUNTER — Other Ambulatory Visit: Payer: Self-pay | Admitting: Cardiology

## 2022-10-04 DIAGNOSIS — E876 Hypokalemia: Secondary | ICD-10-CM

## 2022-10-19 ENCOUNTER — Other Ambulatory Visit: Payer: Self-pay | Admitting: Cardiology

## 2022-10-19 DIAGNOSIS — I2510 Atherosclerotic heart disease of native coronary artery without angina pectoris: Secondary | ICD-10-CM

## 2022-10-20 DIAGNOSIS — M6281 Muscle weakness (generalized): Secondary | ICD-10-CM | POA: Diagnosis not present

## 2022-10-20 DIAGNOSIS — R5383 Other fatigue: Secondary | ICD-10-CM | POA: Diagnosis not present

## 2022-11-03 DIAGNOSIS — M1711 Unilateral primary osteoarthritis, right knee: Secondary | ICD-10-CM | POA: Diagnosis not present

## 2022-11-24 DIAGNOSIS — G4733 Obstructive sleep apnea (adult) (pediatric): Secondary | ICD-10-CM | POA: Diagnosis not present

## 2023-02-09 DIAGNOSIS — M549 Dorsalgia, unspecified: Secondary | ICD-10-CM | POA: Diagnosis not present

## 2023-02-09 DIAGNOSIS — K219 Gastro-esophageal reflux disease without esophagitis: Secondary | ICD-10-CM | POA: Diagnosis not present

## 2023-02-09 DIAGNOSIS — G14 Postpolio syndrome: Secondary | ICD-10-CM | POA: Diagnosis not present

## 2023-02-09 DIAGNOSIS — R600 Localized edema: Secondary | ICD-10-CM | POA: Diagnosis not present

## 2023-02-09 DIAGNOSIS — R0989 Other specified symptoms and signs involving the circulatory and respiratory systems: Secondary | ICD-10-CM | POA: Diagnosis not present

## 2023-02-15 DIAGNOSIS — G4733 Obstructive sleep apnea (adult) (pediatric): Secondary | ICD-10-CM | POA: Diagnosis not present

## 2023-02-15 DIAGNOSIS — I10 Essential (primary) hypertension: Secondary | ICD-10-CM | POA: Diagnosis not present

## 2023-02-19 ENCOUNTER — Telehealth: Payer: Self-pay | Admitting: Cardiology

## 2023-02-19 NOTE — Telephone Encounter (Signed)
Pt c/o BP issue: STAT if pt c/o blurred vision, one-sided weakness or slurred speech  1. What are your last 5 BP readings?   115/63 (today) 134/54  110/56 201/101 (about a week ago) 212/93  2. Are you having any other symptoms (ex. Dizziness, headache, blurred vision, passed out)?   A few months ago for two weeks she has had lightheadedness and had passed out.  3. What is your BP issue?    Patient stated her BP is all over the place and has passed out once.  Patient noted that when her BP drop low she gets very weak.

## 2023-02-19 NOTE — Telephone Encounter (Signed)
Spoke to patient she stated her B/P has been up and down.She has been having weakness, feeling like she could sleep all day for the past 2 months.Stated 2 months ago she did black out.She saw PCP who ordered a head ct which was ok.Dr.Crenshaw's schedule is full.Appointment scheduled with Erica Levering NP 6/25 at 8:50 am.Advised continue to monitor B/P and pulse daily.Bring a log of readings to appointment.

## 2023-02-25 NOTE — Progress Notes (Unsigned)
Cardiology Clinic Note   Date: 02/27/2023 ID: Davidson, Erica 04-Jul-1941, MRN [id number]  Primary Cardiologist:  Olga Millers, MD  Patient Profile    Erica Davidson is a 82 y.o. female who presents to the clinic today for concerns of labile BP.     Past medical history significant for: Nonobstructive CAD. Coronary CTA 07/27/2017: Calcium score 29 (45th percentile).  Nonobstructive mild calcified LM and distal LAD disease. Palpitations. Echo 06/22/2016: EF 55 to 60%.  No significant valvular disease. 7 day Zio 07/18/2021: NSR, SB, ST, occasional PAC, brief PAT, rare PVC. Hypertension. OSA. GERD. Postpolio syndrome.     History of Present Illness    Erica Davidson was first evaluated by Dr. Graciela Husbands on 07/03/2017 for labile BP at the request of Dr. Merri Brunette.  SBP ranging from 100-220.  Often high BP spikes overnight associated with weakness and fatigue.  She reported gait instability and dyspnea attributed to postpolio syndrome.  Renal ultrasound was normal.  Coronary CTA showed nonobstructive CAD.  BP had improved on follow-up and she was instructed to continue to follow-up with PCP with cardiology following as needed.  Patient was first evaluated by Dr. Jens Som on 08/25/2019 for hypotension.  She reported episodic low BP associated with extreme fatigue sometimes lasting 2 days at a time.  She was taking as needed midodrine.  She reported dyspnea with heavy exertion but none with routine activities.  Chlorothiazide was discontinued at that time.  Patient was last seen in the office by Dr. Jens Som on 06/21/2022 for routine follow-up.  She reported occasional pedal edema and occasional problems with labile BP.  She reported SBP dropping to the 90s with associated weakness.  Patient was instructed to take losartan if SBP >120.  Today, patient with a long history of poorly controlled BP. She reports her BP has always dropped low but seems to be happening more frequently since  January. She had a syncopal episode late December/early January in which she became lightheaded while getting ready for bed and passed out injuring her head and face. She was found to have low TSH and medications were adjusted.  No further syncopal episodes.  In early April she began experiencing weakness and palpitations with BP 198/107. She took losartan and eventually BP came down to 130/73. She had low BP for the next 4 days and felt weak. She noticed increased foot and ankle edema bilaterally. In May and June she had continued lability of BP with readings 105-200/60-110. HR 55-75. Continued feet and ankle edema not always resolved in the AM. She does not wear compression socks, as she is unable to get them on herself. She has been taking losartan nearly every day. She reports the palpitations are occasional and not particularly bothersome. She does report occasionally feeling as though her heart rhythm is irregular. She denies chest pain or shortness of breath. She reports at one point she was instructed to increase hydrochlorothiazide by PCP and was unable to tolerate it. She felt bloated and "just terrible" so she went back to her normal dose.     ROS: All other systems reviewed and are otherwise negative except as noted in History of Present Illness.  Studies Reviewed    EKG Interpretation  Date/Time:  Tuesday February 27 2023 08:54:13 EDT Ventricular Rate:  81 PR Interval:  162 QRS Duration: 102 QT Interval:  412 QTC Calculation: 478 R Axis:   46 Text Interpretation: Normal sinus rhythm Nonspecific ST and T wave abnormality When  compared with ECG of 07-Dec-2021 15:14, PREVIOUS ECG IS PRESENT Confirmed by Carlos Levering [phone]) on 02/27/2023 9:04:34 AM   Risk Assessment/Calculations      HYPERTENSION CONTROL Vitals:   02/27/23 0852 02/27/23 1320  BP: (!) 172/74 (!) 180/80    The patient's blood pressure is elevated above target today.  In order to address the patient's elevated  BP: A referral to the [facility] will be placed.           Physical Exam    VS:  BP (!) 172/74 (BP Location: Left Arm, Patient Position: Sitting, Cuff Size: Normal)   Pulse 81   Ht 5\' 3"  (1.6 m)   Wt 147 lb 12.8 oz (67 kg)   SpO2 96%   BMI 26.18 kg/m  , BMI Body mass index is 26.18 kg/m.  GEN: Well nourished, well developed, in no acute distress. Neck: No JVD or carotid bruits. Cardiac:  RRR. No murmurs. No rubs or gallops.   Respiratory:  Respirations regular and unlabored. Clear to auscultation without rales, wheezing or rhonchi. GI: Soft, nontender, nondistended. Extremities: Radials/DP/PT 2+ and equal bilaterally. No clubbing or cyanosis. Mild nonpitting edema bilateral feet and ankles.   Skin: Warm and dry, no rash. Neuro: Strength intact.  Assessment & Plan    Hypertension: BP today 172/74 on intake and 180/80 on my recheck.  Patient denies headaches, dizziness or vision changes.  She reports associated weakness.  BP has been very labile particularly through the May and June with BP running 105-200/60-110. This has been an ongoing problem for patient for years and she has "given up on anyone figuring it out." She had been taking losartan as needed but is now taking it daily. Her PCP tried to increase hydrochlorothiazide but she could not tolerate it stating in made her feel "just terrible" so she went back to previous dose. She is very frustrated with her situations and wants to understand why she is having weakness episodes. Suggested she try an extra 1/2 tablet of hydrochlorothiazide when BP is elevated. Will refer to Dr. Duke Salvia in hypertension clinic. Patient was initially resistant to referral but ultimately agreed.  Nonobstructive CAD.  Coronary CTA November 2018 showed calcium score of 29 and nonobstructive mild calcified LM and distal LAD. Patient denies chest pain, pressure, tightness. No shortness of breath or DOE. Continue rosuvastatin,  aspirin. Palpitations.  7-day ZIO November 2022 showed occasional PACs, brief PAT and rare PVCs.  Patient reports occasional palpitations and irregular heart rhythm. Given syncopal episode and continued palpitations, will get a 2 week Zio.  Syncope. Patient reports at the end of December/beginning of January she had a syncopal episode while getting ready for bed. She reports some mild lightheadedness for a few days prior. Lightheadedness worsened that evening and she passed injuring her head and face. She was later found to have low TSH and medications were adjusted. She states her PCP has encouraged her to pursue a reason for her syncopal episode. No further episodes of syncope. Will get carotid US for further evaluation.   Disposition: Referral to Dr. Duke Salvia at hypertension clinic. 2 week Zio. Carotid US. Keep previously scheduled follow up with Dr. Jens Som in August or return sooner as needed.          Signed, Etta Grandchild. Kloe Oates, DNP, NP-C

## 2023-02-26 DIAGNOSIS — G4733 Obstructive sleep apnea (adult) (pediatric): Secondary | ICD-10-CM | POA: Diagnosis not present

## 2023-02-27 ENCOUNTER — Encounter: Payer: Self-pay | Admitting: Student

## 2023-02-27 ENCOUNTER — Ambulatory Visit: Payer: Medicare PPO | Attending: Student | Admitting: Student

## 2023-02-27 ENCOUNTER — Ambulatory Visit (INDEPENDENT_AMBULATORY_CARE_PROVIDER_SITE_OTHER): Payer: Medicare PPO

## 2023-02-27 VITALS — BP 180/80 | HR 81 | Ht 63.0 in | Wt 147.8 lb

## 2023-02-27 DIAGNOSIS — I2510 Atherosclerotic heart disease of native coronary artery without angina pectoris: Secondary | ICD-10-CM

## 2023-02-27 DIAGNOSIS — R55 Syncope and collapse: Secondary | ICD-10-CM | POA: Diagnosis not present

## 2023-02-27 DIAGNOSIS — I10 Essential (primary) hypertension: Secondary | ICD-10-CM | POA: Diagnosis not present

## 2023-02-27 DIAGNOSIS — R002 Palpitations: Secondary | ICD-10-CM

## 2023-02-27 NOTE — Progress Notes (Unsigned)
Enrolled for [employer] to mail a ZIO XT long term holter monitor to the patients address on file.   Dr. Crenshaw to read. 

## 2023-02-27 NOTE — Patient Instructions (Addendum)
Medication Instructions:   You may take an extra half tablet of hydrochlorothiazide if your systolic (top number) of blood pressure greater than 160  *If you need a refill on your cardiac medications before your next appointment, please call your pharmacy*  Lab Work: NONE ordered at this time of appointment   If you have labs (blood work) drawn today and your tests are completely normal, you will receive your results only by: MyChart Message (if you have MyChart) OR A paper copy in the mail If you have any lab test that is abnormal or we need to change your treatment, we will call you to review the results.  Testing/Procedures: Carlos Levering, NP has requested that you have a carotid duplex. This test is an ultrasound of the carotid arteries in your neck. It looks at blood flow through these arteries that supply the brain with blood. Allow one hour for this exam. There are no restrictions or special instructions.  ZIO XT- Long Term Monitor Instructions  Carlos Levering, NP has requested you wear a ZIO patch monitor for 14 days.  This is a single patch monitor. [employer] supplies one patch monitor per enrollment. Additional stickers are not available. Please do not apply patch if you will be having a Nuclear Stress Test,  Echocardiogram, Cardiac CT, MRI, or Chest Xray during the period you would be wearing the  monitor. The patch cannot be worn during these tests. You cannot remove and re-apply the  ZIO XT patch monitor.  Your ZIO patch monitor will be mailed 3 day USPS to your address on file. It may take 3-5 days  to receive your monitor after you have been enrolled.  Once you have received your monitor, please review the enclosed instructions. Your monitor  has already been registered assigning a specific monitor serial # to you.  Billing and Patient Assistance Program Information  We have supplied [employer] with any of your insurance information on file for billing  purposes. [employer] offers a sliding scale Patient Assistance Program for patients that do not have  insurance, or whose insurance does not completely cover the cost of the ZIO monitor.  You must apply for the Patient Assistance Program to qualify for this discounted rate.  To apply, please call [employer] at [phone], select option 4, select option 2, ask to apply for  Patient Assistance Program. [name] will ask your household income, and how many people  are in your household. They will quote your out-of-pocket cost based on that information.  [employer] will also be able to set up a 78-month, interest-free payment plan if needed.  Applying the monitor   Shave hair from upper left chest.  Hold abrader disc by orange tab. Rub abrader in 40 strokes over the upper left chest as  indicated in your monitor instructions.  Clean area with 4 enclosed alcohol pads. Let dry.  Apply patch as indicated in monitor instructions. Patch will be placed under collarbone on left  side of chest with arrow pointing upward.  Rub patch adhesive wings for 2 minutes. Remove white label marked "1". Remove the white  label marked "2". Rub patch adhesive wings for 2 additional minutes.  While looking in a mirror, press and release button in center of patch. A small green light will  flash 3-4 times. This will be your only indicator that the monitor has been turned on.  Do not shower for the first 24 hours. You may shower after the first 24 hours.  Press the button  if you feel a symptom. You will hear a small click. Record Date, Time and  Symptom in the Patient Logbook.  When you are ready to remove the patch, follow instructions on the last 2 pages of Patient  Logbook. Stick patch monitor onto the last page of Patient Logbook.  Place Patient Logbook in the blue and white box. Use locking tab on box and tape box closed  securely. The blue and white box has prepaid postage on it. Please place it in the mailbox as  soon  as possible. Your physician should have your test results approximately 7 days after the  monitor has been mailed back to [facility].  Call [facility] Customer Care at [phone] if you have questions regarding  your ZIO XT patch monitor. Call them immediately if you see an orange light blinking on your  monitor.  If your monitor falls off in less than 4 days, contact our Monitor department at [phone].  If your monitor becomes loose or falls off after 4 days call [employer] at [phone] for  suggestions on securing your monitor  Follow-Up: At [facility], you and your health needs are our priority.  As part of our continuing mission to provide you with exceptional heart care, we have created designated Provider Care Teams.  These Care Teams include your primary Cardiologist (physician) and Advanced Practice Providers (APPs -  Physician Assistants and Nurse Practitioners) who all work together to provide you with the care you need, when you need it.   Your next appointment:   As previously scheduled  Provider:   Olga Millers, MD     Other Instructions You have been referred to Advanced Hypertension Clinic with Dr. Chilton Si

## 2023-03-01 DIAGNOSIS — E559 Vitamin D deficiency, unspecified: Secondary | ICD-10-CM | POA: Diagnosis not present

## 2023-03-01 DIAGNOSIS — Z23 Encounter for immunization: Secondary | ICD-10-CM | POA: Diagnosis not present

## 2023-03-01 DIAGNOSIS — K219 Gastro-esophageal reflux disease without esophagitis: Secondary | ICD-10-CM | POA: Diagnosis not present

## 2023-03-01 DIAGNOSIS — Z0000 Encounter for general adult medical examination without abnormal findings: Secondary | ICD-10-CM | POA: Diagnosis not present

## 2023-03-01 DIAGNOSIS — Z1331 Encounter for screening for depression: Secondary | ICD-10-CM | POA: Diagnosis not present

## 2023-03-01 DIAGNOSIS — E039 Hypothyroidism, unspecified: Secondary | ICD-10-CM | POA: Diagnosis not present

## 2023-03-01 DIAGNOSIS — E782 Mixed hyperlipidemia: Secondary | ICD-10-CM | POA: Diagnosis not present

## 2023-03-01 DIAGNOSIS — G14 Postpolio syndrome: Secondary | ICD-10-CM | POA: Diagnosis not present

## 2023-03-01 DIAGNOSIS — I10 Essential (primary) hypertension: Secondary | ICD-10-CM | POA: Diagnosis not present

## 2023-03-06 ENCOUNTER — Ambulatory Visit (HOSPITAL_COMMUNITY)
Admission: RE | Admit: 2023-03-06 | Discharge: 2023-03-06 | Disposition: A | Discharge status: Home / Self Care | Payer: Medicare PPO | Source: Ambulatory Visit | Attending: Student | Admitting: Student

## 2023-03-06 DIAGNOSIS — R55 Syncope and collapse: Secondary | ICD-10-CM | POA: Insufficient documentation

## 2023-03-08 DIAGNOSIS — R002 Palpitations: Secondary | ICD-10-CM | POA: Diagnosis not present

## 2023-04-02 DIAGNOSIS — H353131 Nonexudative age-related macular degeneration, bilateral, early dry stage: Secondary | ICD-10-CM | POA: Diagnosis not present

## 2023-04-02 DIAGNOSIS — H02881 Meibomian gland dysfunction right upper eyelid: Secondary | ICD-10-CM | POA: Diagnosis not present

## 2023-04-02 DIAGNOSIS — H04123 Dry eye syndrome of bilateral lacrimal glands: Secondary | ICD-10-CM | POA: Diagnosis not present

## 2023-04-02 DIAGNOSIS — G902 Horner's syndrome: Secondary | ICD-10-CM | POA: Diagnosis not present

## 2023-04-11 ENCOUNTER — Encounter: Payer: Self-pay | Admitting: Cardiology

## 2023-04-26 NOTE — Progress Notes (Signed)
HPI: Follow-up hypertension.  Previously followed by Dr. Graciela Husbands.  Apparently had previous evaluation to exclude hyperaldosteronism and pheochromocytoma. Echocardiogram October 2017 showed normal LV function. Cardiac CTA November 2018 showed calcium score of 29, less than 30% left main, less than 30% LAD. Aortic root mildly dilated at 38 mm. Renal Dopplers November 2018 showed no renal artery stenosis. Monitor November 2022 showed sinus rhythm with occasional PAC, brief PAT and rare PVC.  Carotid Dopplers July 2024 showed no evidence of stenosis bilaterally.  Monitor July 2024 showed sinus rhythm with PACs, short runs of SVT and rare PVC.  Since last seen, she continues to have difficulties with labile hypertension.  She only takes her losartan when her blood pressure spikes.  However most of the time she states it runs low.  She feels fatigued and weak when her systolic is 110 or diastolic is less than 70.  She denies chest pain, dyspnea.  Current Outpatient Medications  Medication Sig Dispense Refill   acetaminophen (TYLENOL) 500 MG tablet Take 500 mg by mouth every 6 (six) hours as needed for moderate pain.     aspirin EC 81 MG tablet Take 1 tablet (81 mg total) by mouth 2 (two) times daily after a meal. For 2 weeks then 1 x a day for 2 weeks for blood clot prevention. 45 tablet 0   Carboxymeth-Glyc-Polysorb PF (REFRESH OPTIVE MEGA-3) 0.5-1-0.5 % SOLN Place 1 drop into both eyes in the morning and at bedtime.     Cholecalciferol (VITAMIN D) 50 MCG (2000 UT) tablet Take 2,000 Units by mouth daily.     DEXILANT 30 MG capsule DR Take 1 capsule by mouth daily.     hydrochlorothiazide (MICROZIDE) 12.5 MG capsule TAKE 1 CAPSULE BY MOUTH DAILY 90 capsule 3   levothyroxine (SYNTHROID) 125 MCG tablet Take 125 mcg by mouth every morning.     losartan (COZAAR) 50 MG tablet Take 50 mg by mouth daily as needed (Take if SBP is > 150).     melatonin 1 MG TABS tablet Take 2 mg by mouth at bedtime.      potassium chloride SA (KLOR-CON M) 20 MEQ tablet TAKE 1 TABLET BY MOUTH EVERY OTHER DAY 45 tablet 10   Respiratory Therapy Supplies (CARETOUCH 2 CPAP HOSE HANGER) MISC      RESTASIS 0.05 % ophthalmic emulsion Place 1 drop into both eyes 2 (two) times daily.     rosuvastatin (CRESTOR) 20 MG tablet TAKE 1 TABLET BY MOUTH DAILY 90 tablet 3   sertraline (ZOLOFT) 50 MG tablet Take 50 mg by mouth daily.     traMADol (ULTRAM) 50 MG tablet Take 50 mg by mouth every 6 (six) hours as needed for severe pain.     No current facility-administered medications for this visit.     Past Medical History:  Diagnosis Date   Arthritis    Blood transfusion without reported diagnosis    Cataract    Colitis    Complication of anesthesia    trouble waking up   Coronary artery disease    mild   Depression    Diverticulosis    Encounter for Zostavax administration 02/10/2008   Family history of adverse reaction to anesthesia    Son has [place] and vomiting   Fibromyalgia    GERD (gastroesophageal reflux disease)    Hemorrhoids, internal    Hepatitis    Hep B from medication no longer an issue   Hypertension    Immunization, tetanus-diphtheria 10/05/2002  Low BP    Neuromuscular disorder (HCC)    low endurance   Pneumococcal vaccination given 01/02/2009   Pneumonia    Polio    Post-polio syndrome    Sleep apnea    Cpap   Ulcer     Past Surgical History:  Procedure Laterality Date   ABDOMINAL HYSTERECTOMY     APPENDECTOMY     CHOLECYSTECTOMY     EYE SURGERY Bilateral    cataract   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   Procedure: RIGHT TOTAL KNEE ARTHROPLASTY;  Surgeon: Marcene Corning, MD;  Location: [facility];  Service: Orthopedics;  Laterality: Right;    Social History   Socioeconomic History   Marital status: Married    Spouse name: Starleen Blue   Number of children: 2   Years of education: College   Highest education level: Not on file  Occupational History   Occupation:  Retired Runner, broadcasting/film/video  Tobacco Use   Smoking status: Never    Passive exposure: Never   Smokeless tobacco: Never  Vaping Use   Vaping status: Never Used  Substance and Sexual Activity   Alcohol use: No   Drug use: No   Sexual activity: Not Currently  Other Topics Concern   Not on file  Social History Narrative   Pt lives at home with spouse in a one story home.  Has one daughter.  Retired Runner, broadcasting/film/video.  Education: college.    Caffeine Use: quit 09/2012   Patient is right handed   Social Determinants of Health   Financial Resource Strain: Not on file  Food Insecurity: Not on file  Transportation Needs: Not on file  Physical Activity: Not on file  Stress: Not on file  Social Connections: Not on file  Intimate Partner Violence: Not on file    Family History  Problem Relation Age of Onset   Rheum arthritis Mother    Liver disease Father    Cancer Sister        Lung    ROS: no fevers or chills, productive cough, hemoptysis, dysphasia, odynophagia, melena, hematochezia, dysuria, hematuria, rash, seizure activity, orthopnea, PND, pedal edema, claudication. Remaining systems are negative.  Physical Exam: Well-developed well-nourished in no acute distress.  Skin is warm and dry.  HEENT is normal.  Neck is supple.  Chest is clear to auscultation with normal expansion.  Cardiovascular exam is regular rate and rhythm.  Abdominal exam nontender or distended. No masses palpated. Extremities show no edema. neuro grossly intact   A/P  1 coronary artery disease-noted to be nonobstructive on previous CTA.  She is not having chest pain.  Continue aspirin and statin.  2 hyperlipidemia-continue statin.  3 labile hypertension-patient continues to have difficulties predominantly with low blood pressure but does have spikes when she takes losartan.  She feels weak and fatigued when her systolic is less than 120 and her diastolic is less than 70.  I will discontinue her HCTZ and allow her blood  pressure to run higher to see if this improves her symptoms.  4 palpitations-previous monitor as outlined above.  Olga Millers, MD

## 2023-05-02 ENCOUNTER — Ambulatory Visit: Payer: Medicare PPO | Attending: Cardiology | Admitting: Cardiology

## 2023-05-02 ENCOUNTER — Encounter: Payer: Self-pay | Admitting: Cardiology

## 2023-05-02 VITALS — BP 156/69 | HR 85 | Ht 63.0 in | Wt 147.0 lb

## 2023-05-02 DIAGNOSIS — I2510 Atherosclerotic heart disease of native coronary artery without angina pectoris: Secondary | ICD-10-CM | POA: Diagnosis not present

## 2023-05-02 DIAGNOSIS — I10 Essential (primary) hypertension: Secondary | ICD-10-CM | POA: Diagnosis not present

## 2023-05-02 DIAGNOSIS — E785 Hyperlipidemia, unspecified: Secondary | ICD-10-CM

## 2023-05-02 DIAGNOSIS — R002 Palpitations: Secondary | ICD-10-CM | POA: Diagnosis not present

## 2023-05-02 NOTE — Patient Instructions (Signed)
Medication Instructions:   DISCONTINUE HCTZ  *If you need a refill on your cardiac medications before your next appointment, please call your pharmacy*   Lab Work:  None ordered.  If you have labs (blood work) drawn today and your tests are completely normal, you will receive your results only by: MyChart Message (if you have MyChart) OR A paper copy in the mail If you have any lab test that is abnormal or we need to change your treatment, we will call you to review the results.   Testing/Procedures:  None ordered.   Follow-Up: At [facility], you and your health needs are our priority.  As part of our continuing mission to provide you with exceptional heart care, we have created designated Provider Care Teams.  These Care Teams include your primary Cardiologist (physician) and Advanced Practice Providers (APPs -  Physician Assistants and Nurse Practitioners) who all work together to provide you with the care you need, when you need it.  We recommend signing up for the patient portal called "MyChart".  Sign up information is provided on this After Visit Summary.  MyChart is used to connect with patients for Virtual Visits (Telemedicine).  Patients are able to view lab/test results, encounter notes, upcoming appointments, etc.  Non-urgent messages can be sent to your provider as well.   To learn more about what you can do with MyChart, go to [email or web address].    Your next appointment:   6 month(s)  Provider:   Olga Millers, MD

## 2023-05-15 ENCOUNTER — Encounter (HOSPITAL_BASED_OUTPATIENT_CLINIC_OR_DEPARTMENT_OTHER): Payer: Self-pay | Admitting: Cardiovascular Disease

## 2023-05-15 ENCOUNTER — Ambulatory Visit (HOSPITAL_BASED_OUTPATIENT_CLINIC_OR_DEPARTMENT_OTHER): Payer: Medicare PPO | Admitting: Cardiovascular Disease

## 2023-05-15 VITALS — BP 200/78 | HR 71 | Ht 63.0 in | Wt 148.7 lb

## 2023-05-15 DIAGNOSIS — I10 Essential (primary) hypertension: Secondary | ICD-10-CM

## 2023-05-15 DIAGNOSIS — B91 Sequelae of poliomyelitis: Secondary | ICD-10-CM

## 2023-05-15 NOTE — Progress Notes (Signed)
Advanced Hypertension Clinic Initial Assessment:    Date:  05/15/2023   ID:  Erica Davidson, DOB 11-20-1940, MRN [id number]  PCP:  Merri Brunette, MD  Cardiologist:  Olga Millers, MD  Nephrologist:  Referring MD: Carlos Levering, NP   CC: Hypertension  History of Present Illness:    Erica Davidson is a 82 y.o. female with a hx of nonobstructive CAD, hypertension, hyperlipidemia,  here to establish care in the Advanced Hypertension Clinic. Initially seen by Dr. Graciela Husbands in 06/2017 for labile blood pressures ranging 100-220 systolic. Renal dopplers 07/2017 were negative for renal artery stenosis. Coronary CTA showed a coronary calcium score of 29 with nonobstructive disease. Aortic root was mildly dilated at 38 mm. She was later evaluated by Dr. Jens Som 08/25/2019 for hypotension associated with extreme fatigue. She was taking prn midodrine at that time. At her visit with Carlos Levering, NP 02/2023, her blood pressure was 180/80. It was noted that she had a syncopal episode in late December 2023 and was found to have low TSH. In 12/2022 she developed weakness and palpitations associated with blood pressure 198/107, with improvement to 130/73 after taking prn losartan. Her blood pressure remained low for 4 days afterwards. From May to her visit with Gavin Pound her blood pressures were labile ranging 105-200/60-110, and she was referred to the Advanced Hypertension clinic. Carotid dopplers 03/2023 were normal. She also wore a heart monitor showing 3 runs of SVT, longest lasting 37.8 seconds. Rare PACs and PVCs. She was seen by Dr. Jens Som 05/02/2023, and blood pressure was 156/69 in the office. She noted mostly low pressures at home. HCTZ was discontinued.   Today, she confirms struggling with labile blood pressure for years. Initially her blood pressure was hypertensive and had been managed on Losartan for a long time until her BP became labile. In the past 6 months, her blood pressure has been  dropping low (100s-110s) every day associated with overwhelming fatigue and somnolence lasting for 2-3 hours. Usually occurs about 2 hours after she wakes up. She is unable to function during this time. Her hypotensive episodes used to be more sporadic but are now occurring daily. In the office her blood pressure is 177/80 initially, and 200/78 on manual recheck. She did not check her blood pressure this morning and therefore did not take the losartan. As previously advised by her providers, she no longer takes losartan unless her blood pressure is >150 systolic. She may need to take the losartan about 2 days a week. Mostly her blood pressures are staying too low lately. She presents a blood pressure log which is personally reviewed. She admits to not drinking enough water, but does try to have at least 1 bottle of water daily. Drinks 2 cups of coffee a day. We discussed using compression socks with zippers as she was previously unable to put them on. However, she does note easy bleeding with abrasive skin injuries. She denies any palpitations, chest pain, shortness of breath, peripheral edema, lightheadedness, syncope, orthopnea, or PND.  Previous antihypertensives: N/a  Past Medical History:  Diagnosis Date   Arthritis    Blood transfusion without reported diagnosis    Cataract    Colitis    Complication of anesthesia    trouble waking up   Coronary artery disease    mild   Depression    Diverticulosis    Encounter for Zostavax administration 02/10/2008   Family history of adverse reaction to anesthesia    Son has naseau and vomiting  Fibromyalgia    GERD (gastroesophageal reflux disease)    Hemorrhoids, internal    Hepatitis    Hep B from medication no longer an issue   Hypertension    Immunization, tetanus-diphtheria 10/05/2002   Low BP    Neuromuscular disorder (HCC)    low endurance   Pneumococcal vaccination given 01/02/2009   Pneumonia    Polio    Post-polio syndrome     Sleep apnea    Cpap   Ulcer     Past Surgical History:  Procedure Laterality Date   ABDOMINAL HYSTERECTOMY     APPENDECTOMY     CHOLECYSTECTOMY     EYE SURGERY Bilateral    cataract   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   Procedure: RIGHT TOTAL KNEE ARTHROPLASTY;  Surgeon: Marcene Corning, MD;  Location: [facility];  Service: Orthopedics;  Laterality: Right;    Current Medications: Current Meds  Medication Sig   acetaminophen (TYLENOL) 500 MG tablet Take 500 mg by mouth every 6 (six) hours as needed for moderate pain.   aspirin EC 81 MG tablet Take 1 tablet (81 mg total) by mouth 2 (two) times daily after a meal. For 2 weeks then 1 x a day for 2 weeks for blood clot prevention.   Carboxymeth-Glyc-Polysorb PF (REFRESH OPTIVE MEGA-3) 0.5-1-0.5 % SOLN Place 1 drop into both eyes in the morning and at bedtime.   Cholecalciferol (VITAMIN D) 50 MCG (2000 UT) tablet Take 2,000 Units by mouth daily.   DEXILANT 30 MG capsule DR Take 1 capsule by mouth daily.   levothyroxine (SYNTHROID) 125 MCG tablet Take 125 mcg by mouth every morning.   losartan (COZAAR) 50 MG tablet Take 50 mg by mouth daily as needed (Take if SBP is > 150).   melatonin 1 MG TABS tablet Take 2 mg by mouth at bedtime.   potassium chloride SA (KLOR-CON M) 20 MEQ tablet TAKE 1 TABLET BY MOUTH EVERY OTHER DAY   Respiratory Therapy Supplies (CARETOUCH 2 CPAP HOSE HANGER) MISC    RESTASIS 0.05 % ophthalmic emulsion Place 1 drop into both eyes 2 (two) times daily.   rosuvastatin (CRESTOR) 20 MG tablet TAKE 1 TABLET BY MOUTH DAILY   sertraline (ZOLOFT) 50 MG tablet Take 50 mg by mouth daily.   traMADol (ULTRAM) 50 MG tablet Take 50 mg by mouth every 6 (six) hours as needed for severe pain.     Allergies:   Zithromax [azithromycin], Ciprofloxacin, Iodine, Lisinopril, Loratadine-pseudoephedrine er, Milnacipran, Penicillins, Prednisone, and Codeine   Social History   Socioeconomic History   Marital status: Married    Spouse  name: Starleen Blue   Number of children: 2   Years of education: College   Highest education level: Not on file  Occupational History   Occupation: Retired Runner, broadcasting/film/video  Tobacco Use   Smoking status: Never    Passive exposure: Never   Smokeless tobacco: Never  Vaping Use   Vaping status: Never Used  Substance and Sexual Activity   Alcohol use: No   Drug use: No   Sexual activity: Not Currently  Other Topics Concern   Not on file  Social History Narrative   Pt lives at home with spouse in a one story home.  Has one daughter.  Retired Runner, broadcasting/film/video.  Education: college.    Caffeine Use: quit 09/2012   Patient is right handed   Social Determinants of Health   Financial Resource Strain: Not on file  Food Insecurity: No Food Insecurity (05/15/2023)   Hunger  Vital Sign    Worried About Programme researcher, broadcasting/film/video in the Last Year: Never true    Ran Out of Food in the Last Year: Never true  Transportation Needs: No Transportation Needs (05/15/2023)   PRAPARE - Administrator, Civil Service (Medical): No    Lack of Transportation (Non-Medical): No  Physical Activity: Inactive (05/15/2023)   Exercise Vital Sign    Days of Exercise per Week: 0 days    Minutes of Exercise per Session: 0 min  Stress: Not on file  Social Connections: Not on file     Family History: The patient's family history includes Cancer in her sister; Liver disease in her father; Rheum arthritis in her mother.  ROS:   Please see the history of present illness.    (+) Fatigue/Malaise (+) Somnolence (+) Easy bleeding All other systems reviewed and are negative.  EKGs/Labs/Other Studies Reviewed:    Monitor  03/2023: Patch Wear Time:  1 days and 14 hours (2024-06-28T13:24:13-0400 to 2024-06-30T03:35:01-399)   Patient had a min HR of 52 bpm, max HR of 122 bpm, and avg HR of 77 bpm. Predominant underlying rhythm was Sinus Rhythm. 3 Supraventricular Tachycardia runs occurred, the run with the fastest interval lasting  5 beats with a max rate of 122 bpm, the  longest lasting 37.8 secs with an avg rate of 97 bpm. Isolated SVEs were rare (<1.0%), SVE Triplets were rare (<1.0%), and no SVE Couplets were present. Isolated VEs were rare (<1.0%), and no VE Couplets or VE Triplets were present.    Sinus bradycardia, NSR, sinus tachycardia, pacs, short runs of SVT and rare PVC.  Bilateral Carotid Dopplers  03/06/2023: Summary:  Right Carotid: There is no evidence of stenosis in the right ICA. The extracranial vessels were near-normal with only minimal wall thickening or plaque.   Left Carotid: There is no evidence of stenosis in the left ICA. There was no evidence of thrombus, dissection, atherosclerotic plaque or stenosis in the cervical carotid system.   Vertebrals:  Bilateral vertebral arteries demonstrate antegrade flow.  Subclavians: Normal flow hemodynamics were seen in bilateral subclavian arteries.   EKG:  EKG is personally reviewed. 05/15/2023: Not ordered.  Recent Labs: No results found for requested labs within last 365 days.   Recent Lipid Panel    Component Value Date/Time   CHOL 159 10/27/2020 1218   TRIG 75 10/27/2020 1218   HDL 81 10/27/2020 1218   CHOLHDL 2.0 10/27/2020 1218   LDLCALC 64 10/27/2020 1218    Physical Exam:    VS:  BP (!) 200/78 (BP Location: Right Arm, Patient Position: Sitting, Cuff Size: Normal)   Pulse 71   Ht 5\' 3"  (1.6 m)   Wt 148 lb 11.2 oz (67.4 kg)   SpO2 96%   BMI 26.34 kg/m  , BMI Body mass index is 26.34 kg/m. GENERAL:  Well appearing HEENT: Pupils equal round and reactive, fundi not visualized, oral mucosa unremarkable NECK:  No jugular venous distention, waveform within normal limits, carotid upstroke brisk and symmetric, no bruits, no thyromegaly LUNGS:  Clear to auscultation bilaterally HEART:  RRR.  PMI not displaced or sustained, S1 and S2 within normal limits, no S3, no S4, no clicks, no rubs, no murmurs ABD:  Flat, positive bowel sounds normal in  frequency in pitch, no bruits, no rebound, no guarding, no midline pulsatile mass, no hepatomegaly, no splenomegaly EXT:  2 plus pulses throughout, 1+ LE edema, no cyanosis, no clubbing SKIN:  No rashes,  no nodules NEURO:  Cranial nerves II through XII grossly intact, motor grossly intact throughout PSYCH:  Cognitively intact, oriented to person place and time   ASSESSMENT/PLAN:    # Hypertension with labile blood pressure Patient reports daily episodes of hypotension causing fatigue and weakness, with occasional episodes of hypertension. Currently taking Losartan as needed when BP >150. Also taking Hydrochlorothiazide 12.5mg  daily for leg swelling.  She tried to stop HCTZ but could not tolerate it.  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 -Continue Losartan as needed for BP >150. -Continue Hydrochlorothiazide 12.5mg  daily. -Increase fluid intake to 2-3 liters per day. -Consider use of compression socks to prevent rapid drops in blood pressure. -Track blood pressure morning and night for a month to identify patterns and optimize timing of Losartan dose. -Follow-up in 4-6 weeks to reassess blood pressure control.  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 -Continue current management strategies. -Consider consultation with a neurologist.     # Hyperlipidemia:  Continue rosuvastatin.  Screening for Secondary Hypertension:     Relevant Labs/Studies:    Latest Ref Rng & Units 12/07/2021    2:26 PM 10/24/2021   12:45 PM 10/17/2021    1:44 PM  Basic Labs  Sodium 135 - 145 mmol/L 135  143  138   Potassium 3.5 - 5.1 mmol/L 2.8  4.3  3.2   Creatinine 0.44 - 1.00 mg/dL 2.13  0.86  5.78        Latest Ref Rng & Units 12/25/2017    6:44 AM  Thyroid   TSH 0.350 - 4.500 uIU/mL 0.577                 07/19/2017   11:39 AM  Renovascular   Renal Artery [place] Completed Yes    Disposition:    FU in Advanced Hypertension Clinic in 4-6 weeks.   Medication Adjustments/Labs and Tests Ordered: Current medicines are reviewed at length with the patient today.  Concerns regarding medicines are outlined above.   No orders of the defined types were placed in this encounter.  No orders of the defined types were placed in this encounter.  I,Mathew Stumpf,acting as a Neurosurgeon for Chilton Si, MD.,have documented all relevant documentation on the behalf of Chilton Si, MD,as directed by  Chilton Si, MD while in the presence of Chilton Si, MD.  I, Sheralyn Pinegar C. Duke Salvia, MD have reviewed all documentation for this visit.  The documentation of the exam, diagnosis, procedures, and orders on 05/15/2023 are all accurate and complete.   Signed, Chilton Si, MD  05/15/2023 5:52 PM    [facility]

## 2023-05-15 NOTE — Patient Instructions (Signed)
Medication Instructions:  Your physician recommends that you continue on your current medications as directed. Please refer to the Current Medication list given to you today.  Labwork: NONE  Testing/Procedures: NONE  Follow-Up: 4-6 WEEKS IN ADV HTN CLINIC/PHARM D   Any Other Special Instructions Will Be Listed Below (If Applicable). TRY THE COMPRESSION STOCKINGS WITH ZIPPER  DRINK 2 & 1/2 TO 3 LITERS DAILY   MONITOR YOUR BLOOD PRESSURE DAILY AND LOG. BRING LOG AND MACHINE TO FOLLOW UP   If you need a refill on your cardiac medications before your next appointment, please call your pharmacy.

## 2023-05-28 DIAGNOSIS — G4733 Obstructive sleep apnea (adult) (pediatric): Secondary | ICD-10-CM | POA: Diagnosis not present

## 2023-06-08 DIAGNOSIS — E2839 Other primary ovarian failure: Secondary | ICD-10-CM | POA: Diagnosis not present

## 2023-06-08 DIAGNOSIS — Z1231 Encounter for screening mammogram for malignant neoplasm of breast: Secondary | ICD-10-CM | POA: Diagnosis not present

## 2023-06-08 DIAGNOSIS — R29890 Loss of height: Secondary | ICD-10-CM | POA: Diagnosis not present

## 2023-06-08 DIAGNOSIS — M8588 Other specified disorders of bone density and structure, other site: Secondary | ICD-10-CM | POA: Diagnosis not present

## 2023-06-21 DIAGNOSIS — Z23 Encounter for immunization: Secondary | ICD-10-CM | POA: Diagnosis not present

## 2023-06-21 DIAGNOSIS — M810 Age-related osteoporosis without current pathological fracture: Secondary | ICD-10-CM | POA: Diagnosis not present

## 2023-06-21 DIAGNOSIS — E039 Hypothyroidism, unspecified: Secondary | ICD-10-CM | POA: Diagnosis not present

## 2023-06-26 ENCOUNTER — Encounter: Payer: Self-pay | Admitting: Pharmacist

## 2023-06-26 ENCOUNTER — Ambulatory Visit: Payer: Medicare PPO | Attending: Cardiology | Admitting: Pharmacist

## 2023-06-26 ENCOUNTER — Telehealth (HOSPITAL_BASED_OUTPATIENT_CLINIC_OR_DEPARTMENT_OTHER): Payer: Self-pay

## 2023-06-26 VITALS — BP 157/73 | HR 73

## 2023-06-26 DIAGNOSIS — R0989 Other specified symptoms and signs involving the circulatory and respiratory systems: Secondary | ICD-10-CM

## 2023-06-26 NOTE — Progress Notes (Signed)
Patient ID: Erica Davidson                 DOB: May 13, 1941                      MRN: [id number]     HPI: Erica Davidson is a 82 y.o. female referred by Dr. Duke Salvia to HTN clinic. PMH is significant for labile HTN, OSA, hypothyroidism, osteoporosis, and a history of polio.  Patient presents today to discuss labile HTN. Woke up this morning and BP was 196/94. Took hydrochlorothiazide and losartan 50mg  at 8:30am. Has not rechecked yet.  Reports typically within 2 hours is when she becomes hypotensive. Feels like she immediately loses all energy and falls asleep. Has fallen before.  Home logs:  10/22 morning: 196/94  10/21 morning: 110/60 10/21 evening: 136/77  10/20 morning: 190/104 10/20 evening: 179/97  10/19 morning: 139/73. No evening results  10/16 morning: 129/78 10/16 evening: 135/82  Very frustrated about BP dropping so quickly and suddenly. She finds it hard to make plans or keep a schedule because she does not know if she will be asleep.  Current HTN meds:  Losartan 50mg  if SBP >150 Hydrochlorothiazide 12.5mg  daily  Wt Readings from Last 3 Encounters:  05/15/23 148 lb 11.2 oz (67.4 kg)  05/02/23 147 lb (66.7 kg)  02/27/23 147 lb 12.8 oz (67 kg)   BP Readings from Last 3 Encounters:  06/26/23 (!) 157/73  05/15/23 (!) 200/78  05/02/23 (!) 156/69   Pulse Readings from Last 3 Encounters:  06/26/23 73  05/15/23 71  05/02/23 85    Renal function: CrCl cannot be calculated (Patient's most recent lab result is older than the maximum 21 days allowed.).  Past Medical History:  Diagnosis Date   Arthritis    Blood transfusion without reported diagnosis    Cataract    Colitis    Complication of anesthesia    trouble waking up   Coronary artery disease    mild   Depression    Diverticulosis    Encounter for Zostavax administration 02/10/2008   Family history of adverse reaction to anesthesia    Son has naseau and vomiting   Fibromyalgia    GERD  (gastroesophageal reflux disease)    Hemorrhoids, internal    Hepatitis    Hep B from medication no longer an issue   Hypertension    Immunization, tetanus-diphtheria 10/05/2002   Low BP    Neuromuscular disorder (HCC)    low endurance   Pneumococcal vaccination given 01/02/2009   Pneumonia    Polio    Post-polio syndrome    Sleep apnea    Cpap   Ulcer     Current Outpatient Medications on File Prior to Visit  Medication Sig Dispense Refill   alendronate (FOSAMAX) 70 MG tablet Take 70 mg by mouth once a week.     GEMTESA 75 MG TABS Take 1 tablet by mouth daily.     lansoprazole (PREVACID) 30 MG capsule Take 30 mg by mouth 2 (two) times daily.     acetaminophen (TYLENOL) 500 MG tablet Take 500 mg by mouth every 6 (six) hours as needed for moderate pain.     aspirin EC 81 MG tablet Take 1 tablet (81 mg total) by mouth 2 (two) times daily after a meal. For 2 weeks then 1 x a day for 2 weeks for blood clot prevention. 45 tablet 0   Carboxymeth-Glyc-Polysorb PF (REFRESH OPTIVE MEGA-3) 0.5-1-0.5 %  SOLN Place 1 drop into both eyes in the morning and at bedtime.     Cholecalciferol (VITAMIN D) 50 MCG (2000 UT) tablet Take 2,000 Units by mouth daily.     levothyroxine (SYNTHROID) 125 MCG tablet Take 125 mcg by mouth every morning.     losartan (COZAAR) 50 MG tablet Take 50 mg by mouth daily as needed (Take if SBP is > 150). In the evening     melatonin 1 MG TABS tablet Take 2 mg by mouth at bedtime.     potassium chloride SA (KLOR-CON M) 20 MEQ tablet TAKE 1 TABLET BY MOUTH EVERY OTHER DAY 45 tablet 10   RESTASIS 0.05 % ophthalmic emulsion Place 1 drop into both eyes 2 (two) times daily.     rosuvastatin (CRESTOR) 20 MG tablet TAKE 1 TABLET BY MOUTH DAILY 90 tablet 3   sertraline (ZOLOFT) 50 MG tablet Take 50 mg by mouth daily.     traMADol (ULTRAM) 50 MG tablet Take 50 mg by mouth every 6 (six) hours as needed for severe pain.     No current facility-administered medications on file prior  to visit.    Allergies  Allergen Reactions   Zithromax [Azithromycin] Shortness Of Breath   Ciprofloxacin     REACTION: rash/itch   Iodine Other (See Comments)    Doesn't remember what happened   Lisinopril Other (See Comments)    cough   Loratadine-Pseudoephedrine Er     REACTION: tachycardia   Milnacipran Other (See Comments)    Doesn't remember what happens   Penicillins     Questionable allergy per pt   Prednisone     high dose taper - gave pt high anxiety, was able to take depo-medrol 80 mg    Codeine Palpitations    Rapid heart rate     Assessment/Plan:  1. Hypertension -  HYPERTENSION CONTROL Vitals:   06/26/23 1138 06/26/23 1139  BP: (!) 156/81 (!) 157/73    The patient's blood pressure is elevated above target today.  In order to address the patient's elevated BP: A current anti-hypertensive medication was adjusted today.; Blood pressure will be monitored at home to determine if medication changes need to be made.; A referral to the [facility] will be placed.   Patient BP in room 156/81 which is improved from her reading when she woke up this morning. Difficult to assess trends however patient BP seems to be elevated more in mornings and then rapidly falls after medications. Will have patient switch losartan 50mg  from morning to evening to try to provide better round the clock coverage.   Patient will continue to monitor at home and report back with results. Also considered splitting losartan to 25mg  BID.  Continue hydrochlorothiazide 12.5mg  in morning Change losartan 50mg  to evening  Laural Golden, PharmD, BCACP, CDCES, CPP [address], [address] [place], [place], [address] Phone: [phone], Fax: [phone]

## 2023-06-26 NOTE — Telephone Encounter (Signed)
Received the following Secure chat from Gillian Shields, NP  "Talked to Laural Golden. He saw her today and made some changes so she does not need to see me Thursday. Can we reschedule for HTN clinic 4-6 weeks, please? TY! looks like 12/5 I have a 2:45 p "   Called patient and left detailed message about appointment changes. Thursday appointment cancelled and new appointment scheduled 12/5 at 2:45pm. Gave instructions to call if appointment time does not work for her.

## 2023-06-26 NOTE — Patient Instructions (Addendum)
It was nice meeting you today  Since your blood pressure is high in the morning and then drops after you take your medications, I suggest trying to take the losartan in the evening and continue the hydrochlorothiazide in the morning  Try to increase the amount of tea you are drinking  Send me over your readings via mychart or call on the phone  Laural Golden, PharmD, BCACP, CDCES, CPP [address], [address] [place], [place], [address] Phone: [phone], Fax: [phone]

## 2023-06-28 ENCOUNTER — Encounter (HOSPITAL_BASED_OUTPATIENT_CLINIC_OR_DEPARTMENT_OTHER): Payer: Medicare PPO | Admitting: Family

## 2023-06-28 NOTE — Telephone Encounter (Signed)
2nd call attempt, appointment for today cancelled and rescheduled for December.

## 2023-07-23 ENCOUNTER — Emergency Department (HOSPITAL_COMMUNITY)
Admission: EM | Admit: 2023-07-23 | Discharge: 2023-07-23 | Disposition: A | Discharge status: Home / Self Care | Payer: Medicare PPO

## 2023-07-23 ENCOUNTER — Other Ambulatory Visit: Payer: Self-pay

## 2023-07-23 ENCOUNTER — Emergency Department (HOSPITAL_COMMUNITY): Payer: Medicare PPO

## 2023-07-23 ENCOUNTER — Encounter (HOSPITAL_COMMUNITY): Payer: Self-pay | Admitting: Emergency Medicine

## 2023-07-23 DIAGNOSIS — I2510 Atherosclerotic heart disease of native coronary artery without angina pectoris: Secondary | ICD-10-CM | POA: Diagnosis not present

## 2023-07-23 DIAGNOSIS — Z79899 Other long term (current) drug therapy: Secondary | ICD-10-CM | POA: Diagnosis not present

## 2023-07-23 DIAGNOSIS — R079 Chest pain, unspecified: Secondary | ICD-10-CM

## 2023-07-23 DIAGNOSIS — R0789 Other chest pain: Secondary | ICD-10-CM | POA: Insufficient documentation

## 2023-07-23 DIAGNOSIS — R918 Other nonspecific abnormal finding of lung field: Secondary | ICD-10-CM | POA: Diagnosis not present

## 2023-07-23 DIAGNOSIS — E039 Hypothyroidism, unspecified: Secondary | ICD-10-CM | POA: Insufficient documentation

## 2023-07-23 DIAGNOSIS — Z7982 Long term (current) use of aspirin: Secondary | ICD-10-CM | POA: Insufficient documentation

## 2023-07-23 DIAGNOSIS — J90 Pleural effusion, not elsewhere classified: Secondary | ICD-10-CM | POA: Diagnosis not present

## 2023-07-23 DIAGNOSIS — I10 Essential (primary) hypertension: Secondary | ICD-10-CM | POA: Diagnosis not present

## 2023-07-23 DIAGNOSIS — J9811 Atelectasis: Secondary | ICD-10-CM | POA: Diagnosis not present

## 2023-07-23 LAB — BASIC METABOLIC PANEL
Anion gap: 10 (ref 5–15)
BUN: 19 mg/dL (ref 8–23)
CO2: 25 mmol/L (ref 22–32)
Calcium: 8.4 mg/dL — ABNORMAL LOW (ref 8.9–10.3)
Chloride: 102 mmol/L (ref 98–111)
Creatinine, Ser: 0.77 mg/dL (ref 0.44–1.00)
GFR, Estimated: >60 mL/min (ref >60)
Glucose, Bld: 85 mg/dL (ref 70–99)
Potassium: 3.3 mmol/L — ABNORMAL LOW (ref 3.5–5.1)
Sodium: 137 mmol/L (ref 135–145)

## 2023-07-23 LAB — CBC
HCT: 35.5 % — ABNORMAL LOW (ref 36.0–46.0)
Hemoglobin: 11.7 g/dL — ABNORMAL LOW (ref 12.0–15.0)
MCH: 29.3 pg (ref 26.0–34.0)
MCHC: 33 g/dL (ref 30.0–36.0)
MCV: 89 fL (ref 80.0–100.0)
Platelets: 147 10*3/uL — ABNORMAL LOW (ref 150–400)
RBC: 3.99 MIL/uL (ref 3.87–5.11)
RDW: 14.7 % (ref 11.5–15.5)
WBC: 5.2 10*3/uL (ref 4.0–10.5)
nRBC: 0 % (ref 0.0–0.2)

## 2023-07-23 LAB — TROPONIN I (HIGH SENSITIVITY)
Troponin I (High Sensitivity): 14 ng/L (ref <18)
Troponin I (High Sensitivity): 12 ng/L (ref <18)

## 2023-07-23 MED ORDER — LABETALOL HCL 5 MG/ML IV SOLN
5.0000 mg | Freq: Once | INTRAVENOUS | Status: AC
  Administered 2023-07-23: 5 mg via INTRAVENOUS
  Filled 2023-07-23: qty 4

## 2023-07-23 MED ORDER — IOHEXOL 350 MG/ML SOLN
100.0000 mL | Freq: Once | INTRAVENOUS | Status: AC | PRN
  Administered 2023-07-23: 100 mL via INTRAVENOUS

## 2023-07-23 MED ORDER — ACETAMINOPHEN 500 MG PO TABS
1000.0000 mg | ORAL_TABLET | Freq: Once | ORAL | Status: AC
  Administered 2023-07-23: 1000 mg via ORAL
  Filled 2023-07-23: qty 2

## 2023-07-23 NOTE — ED Triage Notes (Addendum)
Patient complains of chest pain that started yesterday and has since decreased. While at her MD office, there was a concern for abnormalities on her EKG, so she was sent here. She describes the pains location as across the chest and into bilateral shoulders and down the right arm. The pain began as a sharp but over time has become dull.  Patient has not taken her BP medication today.  HX Afib, Hypertension

## 2023-07-23 NOTE — Discharge Instructions (Signed)
Follow-up with your cardiologist.  Return if symptoms worsen.  Follow-up with your primary care doctor as well.

## 2023-07-23 NOTE — ED Provider Notes (Signed)
[facility]ERGENCY DEPARTMENT AT [facility] Provider Note   CSN: [id number] Arrival date & time: 07/23/23  1317     History {Add pertinent medical, surgical, social history, OB history to HPI:1} Chief Complaint  Patient presents with   Chest Pain    Erica Davidson is a 82 y.o. female.  82 year old female with past medical history of hypertension, hypothyroidism, and atrial fibrillation presenting to the emergency department today with chest pain.  The patient states it began yesterday.  She reports that it was a very sharp pain that started in the center of her chest and radiates to her back.  She reports that it also radiates down her right arm.  She reports that was very severe yesterday.  She states that she woke up this morning and it was persistent.  It has improved now she is saying it is more of a pressure sensation.  She denies any fevers, chills, or cough.  Denies any nausea or diaphoresis.  The pain is not exertional.  She was into the emergency department today for further evaluation after calling her doctor.  Denies a history of ACS.  Denies a history of DVT or pulmonary embolism, recent surgeries, recent travel.  She denies any leg pain or swelling.   Chest Pain      Home Medications Prior to Admission medications   Medication Sig Start Date End Date Taking? Authorizing Provider  acetaminophen (TYLENOL) 500 MG tablet Take 500 mg by mouth every 6 (six) hours as needed for moderate pain.    [provider]  alendronate (FOSAMAX) 70 MG tablet Take 70 mg by mouth once a week. 06/21/23   [provider]  aspirin EC 81 MG tablet Take 1 tablet (81 mg total) by mouth 2 (two) times daily after a meal. For 2 weeks then 1 x a day for 2 weeks for blood clot prevention. 10/25/21   Elodia Florence, PA-C  Carboxymeth-Glyc-Polysorb PF (REFRESH OPTIVE MEGA-3) 0.5-1-0.5 % SOLN Place 1 drop into both eyes in the morning and at bedtime.    [provider]   Cholecalciferol (VITAMIN D) 50 MCG (2000 UT) tablet Take 2,000 Units by mouth daily.    [provider]  GEMTESA 75 MG TABS Take 1 tablet by mouth daily. 05/31/23   [provider]  lansoprazole (PREVACID) 30 MG capsule Take 30 mg by mouth 2 (two) times daily. 06/14/23   [provider]  levothyroxine (SYNTHROID) 125 MCG tablet Take 125 mcg by mouth every morning. 03/01/23   [provider]  losartan (COZAAR) 50 MG tablet Take 50 mg by mouth daily as needed (Take if SBP is > 150). In the evening    [provider]  melatonin 1 MG TABS tablet Take 2 mg by mouth at bedtime.    [provider]  potassium chloride SA (KLOR-CON M) 20 MEQ tablet TAKE 1 TABLET BY MOUTH EVERY OTHER DAY 10/04/22   Lewayne Bunting, MD  RESTASIS 0.05 % ophthalmic emulsion Place 1 drop into both eyes 2 (two) times daily. 12/23/19   [provider]  rosuvastatin (CRESTOR) 20 MG tablet TAKE 1 TABLET BY MOUTH DAILY 10/20/22   Lewayne Bunting, MD  sertraline (ZOLOFT) 50 MG tablet Take 50 mg by mouth daily.    [provider]  traMADol (ULTRAM) 50 MG tablet Take 50 mg by mouth every 6 (six) hours as needed for severe pain. 11/26/19   [provider]  Allergies    Zithromax [azithromycin], Ciprofloxacin, Iodine, Lisinopril, Loratadine-pseudoephedrine er, Milnacipran, Penicillins, Prednisone, and Codeine    Review of Systems   Review of Systems  Cardiovascular:  Positive for chest pain.  All other systems reviewed and are negative.   Physical Exam Updated Vital Signs BP (!) 181/83   Pulse 70   Temp 97.9 F (36.6 C) (Oral)   Resp 18   Ht 5\' 3"  (1.6 m)   Wt 67.6 kg   SpO2 96%   BMI 26.39 kg/m  Physical Exam Vitals and nursing note reviewed.   Gen: NAD Eyes: PERRL, EOMI HEENT: no oropharyngeal swelling Neck: trachea midline Resp: clear to auscultation bilaterally Card: RRR, no murmurs, rubs, or gallops Abd: nontender,  nondistended Extremities: no calf tenderness, no edema Vascular: 2+ radial pulses bilaterally, 2+ DP pulses bilaterally Neuro: No focal deficits Skin: no rashes Psyc: acting appropriately   ED Results / Procedures / Treatments   Labs (all labs ordered are listed, but only abnormal results are displayed) Labs Reviewed  BASIC METABOLIC PANEL - Abnormal; Notable for the following components:      Result Value   Potassium 3.3 (*)    Calcium 8.4 (*)    All other components within normal limits  CBC - Abnormal; Notable for the following components:   Hemoglobin 11.7 (*)    HCT 35.5 (*)    Platelets 147 (*)    All other components within normal limits  TROPONIN I (HIGH SENSITIVITY)  TROPONIN I (HIGH SENSITIVITY)    EKG EKG Interpretation Date/Time:  Monday July 23 2023 14:38:51 EST Ventricular Rate:  76 PR Interval:  159 QRS Duration:  100 QT Interval:  418 QTC Calculation: 470 R Axis:   65  Text Interpretation: Sinus rhythm Confirmed by Beckey Downing [id number]) on 07/23/2023 2:55:17 PM  Radiology No results found.  Procedures Procedures  {Document cardiac monitor, telemetry assessment procedure when appropriate:1}  Medications Ordered in ED Medications  labetalol (NORMODYNE) injection 5 mg (has no administration in time range)    ED Course/ Medical Decision Making/ A&P   {   Click here for ABCD2, HEART and other calculatorsREFRESH Note before signing :1}                              Medical Decision Making 82 year old female with past medical history of hypertension, atrial fibrillation, and hypothyroidism presenting to the emergency department today with chest pain.  Patient blood pressure is significantly elevated here on arrival.  In the room evaluating her her blood pressures are over 190 systolic.  I will give her labetalol for this.  Will obtain basic lab as well as an EKG, chest x-ray, and troponin for further evaluation for ACS, pulmonary edema, pulmonary  infiltrates, or pneumothorax.  Given the chest pain radiating to her back and right arm I will also obtain a CT angiogram to evaluate for aortic dissection given the description of her pain that started abruptly yesterday morning and that was very severe.  I discussed her case with cardiology for ultimate disposition.  The patient's EKG interpreted by me shows a sinus rhythm with normal axis, normal intervals, nonspecific ST-T changes.  Amount and/or Complexity of Data Reviewed Labs: ordered. Radiology: ordered.  Risk Prescription drug management.   ***  {Document critical care time when appropriate:1} {Document review of labs and clinical decision tools ie heart score, Chads2Vasc2 etc:1}  {Document your independent review of radiology images, and any  outside records:1} {Document your discussion with family members, caretakers, and with consultants:1} {Document social determinants of health affecting pt's care:1} {Document your decision making why or why not admission, treatments were needed:1} Final Clinical Impression(s) / ED Diagnoses Final diagnoses:  None    Rx / DC Orders ED Discharge Orders     None

## 2023-07-23 NOTE — ED Provider Notes (Signed)
Repeat troponin is stable at 12.  CT dissection study is unremarkable per radiology report.  She is chest pain-free.  Overall cardiology team will follow-up outpatient.  I do not think she is having anginal symptoms at this time she is chest pain-free.  Seems like this could be muscular as well given her history.  Understands return precautions and discharged in the ED in good condition after we discussed things.  This chart was dictated using voice recognition software.  Despite best efforts to proofread,  errors can occur which can change the documentation meaning.    Virgina Norfolk, DO 07/23/23 1949

## 2023-07-25 DIAGNOSIS — E039 Hypothyroidism, unspecified: Secondary | ICD-10-CM | POA: Diagnosis not present

## 2023-08-09 ENCOUNTER — Encounter (HOSPITAL_BASED_OUTPATIENT_CLINIC_OR_DEPARTMENT_OTHER): Payer: Medicare PPO | Admitting: Family

## 2023-08-21 DIAGNOSIS — N3281 Overactive bladder: Secondary | ICD-10-CM | POA: Diagnosis not present

## 2023-08-21 DIAGNOSIS — E039 Hypothyroidism, unspecified: Secondary | ICD-10-CM | POA: Diagnosis not present

## 2023-08-21 DIAGNOSIS — E782 Mixed hyperlipidemia: Secondary | ICD-10-CM | POA: Diagnosis not present

## 2023-08-21 DIAGNOSIS — E7800 Pure hypercholesterolemia, unspecified: Secondary | ICD-10-CM | POA: Diagnosis not present

## 2023-08-21 DIAGNOSIS — I10 Essential (primary) hypertension: Secondary | ICD-10-CM | POA: Diagnosis not present

## 2023-08-21 DIAGNOSIS — K219 Gastro-esophageal reflux disease without esophagitis: Secondary | ICD-10-CM | POA: Diagnosis not present

## 2023-08-21 DIAGNOSIS — F419 Anxiety disorder, unspecified: Secondary | ICD-10-CM | POA: Diagnosis not present

## 2023-08-21 DIAGNOSIS — G14 Postpolio syndrome: Secondary | ICD-10-CM | POA: Diagnosis not present

## 2023-08-21 DIAGNOSIS — F3342 Major depressive disorder, recurrent, in full remission: Secondary | ICD-10-CM | POA: Diagnosis not present

## 2023-08-30 DIAGNOSIS — G4733 Obstructive sleep apnea (adult) (pediatric): Secondary | ICD-10-CM | POA: Diagnosis not present

## 2023-10-09 NOTE — Progress Notes (Deleted)
 HPI: Follow-up hypertension.  Previously followed by Dr. Graciela Husbands.  Apparently had previous evaluation to exclude hyperaldosteronism and pheochromocytoma. Echocardiogram October 2017 showed normal LV function. Cardiac CTA November 2018 showed calcium score of 29, less than 30% left main, less than 30% LAD. Aortic root mildly dilated at 38 mm. Renal Dopplers November 2018 showed no renal artery stenosis. Monitor November 2022 showed sinus rhythm with occasional PAC, brief PAT and rare PVC.  Carotid Dopplers July 2024 showed no evidence of stenosis bilaterally.  Monitor July 2024 showed sinus rhythm with PACs, short runs of SVT and rare PVC.  CTA November 2024 showed no pulmonary embolus.  Since last seen,   Current Outpatient Medications  Medication Sig Dispense Refill   acetaminophen (TYLENOL) 500 MG tablet Take 500 mg by mouth every 6 (six) hours as needed for moderate pain.     alendronate (FOSAMAX) 70 MG tablet Take 70 mg by mouth once a week.     aspirin EC 81 MG tablet Take 1 tablet (81 mg total) by mouth 2 (two) times daily after a meal. For 2 weeks then 1 x a day for 2 weeks for blood clot prevention. 45 tablet 0   Carboxymeth-Glyc-Polysorb PF (REFRESH OPTIVE MEGA-3) 0.5-1-0.5 % SOLN Place 1 drop into both eyes in the morning and at bedtime.     Cholecalciferol (VITAMIN D) 50 MCG (2000 UT) tablet Take 2,000 Units by mouth daily.     GEMTESA 75 MG TABS Take 1 tablet by mouth daily.     lansoprazole (PREVACID) 30 MG capsule Take 30 mg by mouth 2 (two) times daily.     levothyroxine (SYNTHROID) 125 MCG tablet Take 125 mcg by mouth every morning.     losartan (COZAAR) 50 MG tablet Take 50 mg by mouth daily as needed (Take if SBP is > 150). In the evening     melatonin 1 MG TABS tablet Take 2 mg by mouth at bedtime.     potassium chloride SA (KLOR-CON M) 20 MEQ tablet TAKE 1 TABLET BY MOUTH EVERY OTHER DAY 45 tablet 10   RESTASIS 0.05 % ophthalmic emulsion Place 1 drop into both eyes 2 (two)  times daily.     rosuvastatin (CRESTOR) 20 MG tablet TAKE 1 TABLET BY MOUTH DAILY 90 tablet 3   sertraline (ZOLOFT) 50 MG tablet Take 50 mg by mouth daily.     traMADol (ULTRAM) 50 MG tablet Take 50 mg by mouth every 6 (six) hours as needed for severe pain.     No current facility-administered medications for this visit.     Past Medical History:  Diagnosis Date   Arthritis    Blood transfusion without reported diagnosis    Cataract    Colitis    Complication of anesthesia    trouble waking up   Coronary artery disease    mild   Depression    Diverticulosis    Encounter for Zostavax administration 02/10/2008   Family history of adverse reaction to anesthesia    Son has naseau and vomiting   Fibromyalgia    GERD (gastroesophageal reflux disease)    Hemorrhoids, internal    Hepatitis    Hep B from medication no longer an issue   Hypertension    Immunization, tetanus-diphtheria 10/05/2002   Low BP    Neuromuscular disorder (HCC)    low endurance   Pneumococcal vaccination given 01/02/2009   Pneumonia    Polio    Post-polio syndrome    Sleep  apnea    Cpap   Ulcer     Past Surgical History:  Procedure Laterality Date   ABDOMINAL HYSTERECTOMY     APPENDECTOMY     CHOLECYSTECTOMY     EYE SURGERY Bilateral    cataract   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   Procedure: RIGHT TOTAL KNEE ARTHROPLASTY;  Surgeon: Marcene Corning, MD;  Location: [facility];  Service: Orthopedics;  Laterality: Right;    Social History   Socioeconomic History   Marital status: Married    Spouse name: Starleen Blue   Number of children: 2   Years of education: College   Highest education level: Not on file  Occupational History   Occupation: Retired Runner, broadcasting/film/video  Tobacco Use   Smoking status: Never    Passive exposure: Never   Smokeless tobacco: Never  Vaping Use   Vaping status: Never Used  Substance and Sexual Activity   Alcohol use: No   Drug use: No   Sexual activity: Not  Currently  Other Topics Concern   Not on file  Social History Narrative   Pt lives at home with spouse in a one story home.  Has one daughter.  Retired Runner, broadcasting/film/video.  Education: college.    Caffeine Use: quit 09/2012   Patient is right handed   Social Drivers of Health   Financial Resource Strain: Not on file  Food Insecurity: No Food Insecurity (05/15/2023)   Hunger Vital Sign    Worried About Running Out of Food in the Last Year: Never true    Ran Out of Food in the Last Year: Never true  Transportation Needs: No Transportation Needs (05/15/2023)   PRAPARE - Administrator, Civil Service (Medical): No    Lack of Transportation (Non-Medical): No  Physical Activity: Inactive (05/15/2023)   Exercise Vital Sign    Days of Exercise per Week: 0 days    Minutes of Exercise per Session: 0 min  Stress: Not on file  Social Connections: Not on file  Intimate Partner Violence: Not on file    Family History  Problem Relation Age of Onset   Rheum arthritis Mother    Liver disease Father    Cancer Sister        Lung    ROS: no fevers or chills, productive cough, hemoptysis, dysphasia, odynophagia, melena, hematochezia, dysuria, hematuria, rash, seizure activity, orthopnea, PND, pedal edema, claudication. Remaining systems are negative.  Physical Exam: Well-developed well-nourished in no acute distress.  Skin is warm and dry.  HEENT is normal.  Neck is supple.  Chest is clear to auscultation with normal expansion.  Cardiovascular exam is regular rate and rhythm.  Abdominal exam nontender or distended. No masses palpated. Extremities show no edema. neuro grossly intact  ECG- personally reviewed  A/P  1 hypertension-patient has had difficulties with labile hypertension.  2 coronary calcification-noted on previous CTA.  She denies chest pain.  Continue aspirin and statin.  3 hyperlipidemia-continue statin.  4 palpitations-no recent symptoms.  Olga Millers, MD

## 2023-10-11 DIAGNOSIS — S00502A Unspecified superficial injury of oral cavity, initial encounter: Secondary | ICD-10-CM | POA: Diagnosis not present

## 2023-10-16 ENCOUNTER — Encounter: Payer: Self-pay | Admitting: Allergy and Immunology

## 2023-10-16 ENCOUNTER — Other Ambulatory Visit: Payer: Self-pay

## 2023-10-16 ENCOUNTER — Ambulatory Visit: Payer: Medicare PPO | Admitting: Allergy and Immunology

## 2023-10-16 VITALS — BP 162/84 | HR 93 | Temp 98.0°F | Resp 18 | Ht 63.0 in | Wt 148.7 lb

## 2023-10-16 DIAGNOSIS — R5383 Other fatigue: Secondary | ICD-10-CM | POA: Insufficient documentation

## 2023-10-16 DIAGNOSIS — J3089 Other allergic rhinitis: Secondary | ICD-10-CM

## 2023-10-16 DIAGNOSIS — K219 Gastro-esophageal reflux disease without esophagitis: Secondary | ICD-10-CM | POA: Diagnosis not present

## 2023-10-16 DIAGNOSIS — G528 Disorders of other specified cranial nerves: Secondary | ICD-10-CM | POA: Insufficient documentation

## 2023-10-16 MED ORDER — LORATADINE 10 MG PO TABS: 10.0000 mg | ORAL_TABLET | Freq: Every day | ORAL | 5 refills | Status: DC

## 2023-10-16 MED ORDER — FAMOTIDINE 40 MG PO TABS: 40.0000 mg | ORAL_TABLET | Freq: Every day | ORAL | 5 refills | Status: DC

## 2023-10-16 MED ORDER — LANSOPRAZOLE 30 MG PO CPDR: 30.0000 mg | DELAYED_RELEASE_CAPSULE | Freq: Two times a day (BID) | ORAL | 5 refills | Status: DC

## 2023-10-16 NOTE — Progress Notes (Unsigned)
[place]   Dear Erica Davidson,  Thank you for referring Erica Davidson to the [facility] [place] on 10/16/2023.   Below is a summation of this patient's evaluation and recommendations.  Thank you for your referral. I will keep you informed about this patient's response to treatment.   If you have any questions please do not hesitate to contact me.   Sincerely,  Erica Priest, MD Allergy / Immunology [place] [facility]   ______________________________________________________________________    NEW PATIENT NOTE  Referring Provider: Merri Brunette, MD Primary Provider: Merri Brunette, MD Date of office visit: 10/16/2023    Subjective:   Chief Complaint:  Erica Davidson (DOB: Mar 16, 1941) is a 83 y.o. female who presents to the clinic on 10/16/2023 with a chief complaint of No chief complaint on file. Marland Kitchen     HPI: Erica Davidson presents to this clinic in evaluation of postnasal drip.  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  She did well for many years but she has found that over the course of the past year she has had more drainage in her throat.  She complains of having throat clearing and raspy voice and something lodged in her throat and drainage she can never clear out of her throat.  She also apparently has some problems with drinking very thin fluids such as water.  Sometimes she will drink some thin fluids and she will go into a coughing spell.  This occurs even though she has been using Prevacid twice a day and taking some Tums.  She drinks decaf coffee but occasionally has a Coke about 1 time per week.  She has daily chocolate consumption at 10 AM every day with the consumption of chocolate covered almonds.  She does have some  sneezing and some itchy watery eyes on occasion without any anosmia or decreased ability to taste or ugly nasal discharge or headaches.  Past Medical History:  Diagnosis Date   Arthritis    Blood transfusion without reported diagnosis    Cataract    Colitis    Complication of anesthesia    trouble waking up   Coronary artery disease    mild   Depression    Diverticulosis    Encounter for Zostavax administration 02/10/2008   Family history of adverse reaction to anesthesia    Son has naseau and vomiting   Fibromyalgia    GERD (gastroesophageal reflux disease)    Hemorrhoids, internal    Hepatitis    Hep B from medication no longer an issue   Hypertension    Immunization, tetanus-diphtheria 10/05/2002   Low BP    Neuromuscular disorder (HCC)    low endurance   Pneumococcal vaccination given 01/02/2009   Pneumonia    Polio    Post-polio syndrome    Sleep apnea    Cpap   Ulcer     Past Surgical History:  Procedure Laterality Date   ABDOMINAL HYSTERECTOMY     APPENDECTOMY     CHOLECYSTECTOMY     EYE SURGERY Bilateral    cataract   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   Procedure: RIGHT TOTAL KNEE ARTHROPLASTY;  Surgeon: Marcene Corning, MD;  Location: [facility];  Service: Orthopedics;  Laterality: Right;  Allergies as of 10/16/2023       Reactions   Zithromax [azithromycin] Shortness Of Breath   Atenolol Other (See Comments)   Ciprofloxacin    REACTION: rash/itch   Cyclobenzaprine Other (See Comments)   Iodine Other (See Comments)   Doesn't remember what happened   Lisinopril Other (See Comments)   cough   Loratadine-pseudoephedrine Er    REACTION: tachycardia   Methocarbamol Other (See Comments)   Milnacipran Other (See Comments)   Doesn't remember what happens   Penicillins    Questionable allergy per pt   Prednisone    high dose taper - gave pt high anxiety, was able to take depo-medrol 80 mg    Solifenacin    Other Reaction(s): bloating/swelling    Codeine Palpitations   Rapid heart rate        Medication List    acetaminophen 500 MG tablet Commonly known as: TYLENOL Take 500 mg by mouth every 6 (six) hours as needed for moderate pain.   alendronate 70 MG tablet Commonly known as: FOSAMAX Take 70 mg by mouth once a week.   aspirin EC 81 MG tablet Take 1 tablet (81 mg total) by mouth 2 (two) times daily after a meal. For 2 weeks then 1 x a day for 2 weeks for blood clot prevention.   Gemtesa 75 MG Tabs Generic drug: Vibegron Take 1 tablet by mouth daily.   lansoprazole 30 MG capsule Commonly known as: PREVACID Take 30 mg by mouth 2 (two) times daily.   levothyroxine 137 MCG tablet Commonly known as: SYNTHROID Take 137 mcg by mouth. 1 tablet in the morning on an empty stomach Orally Once a day   levothyroxine 125 MCG tablet Commonly known as: SYNTHROID Take 137 mcg by mouth every morning.   losartan 50 MG tablet Commonly known as: COZAAR Take 50 mg by mouth daily as needed (Take if SBP is > 150). In the evening   melatonin 1 MG Tabs tablet Take 2 mg by mouth at bedtime.   potassium chloride SA 20 MEQ tablet Commonly known as: KLOR-CON M TAKE 1 TABLET BY MOUTH EVERY OTHER DAY   Refresh Optive Mega-3 0.5-1-0.5 % Soln Generic drug: Carboxymeth-Glyc-Polysorb PF Place 1 drop into both eyes in the morning and at bedtime.   Restasis 0.05 % ophthalmic emulsion Generic drug: cycloSPORINE Place 1 drop into both eyes 2 (two) times daily.   rosuvastatin 20 MG tablet Commonly known as: CRESTOR TAKE 1 TABLET BY MOUTH DAILY   sertraline 50 MG tablet Commonly known as: ZOLOFT Take 50 mg by mouth daily.   traMADol 50 MG tablet Commonly known as: ULTRAM Take 50 mg by mouth every 6 (six) hours as needed for severe pain.   Vitamin D 50 MCG (2000 UT) tablet Take 2,000 Units by mouth daily.    Review of systems negative except as noted in HPI / PMHx or noted below:  Review of Systems  Constitutional: Negative.    HENT: Negative.    Eyes: Negative.   Respiratory: Negative.    Cardiovascular: Negative.   Gastrointestinal: Negative.   Genitourinary: Negative.   Musculoskeletal: Negative.   Skin: Negative.   Neurological: Negative.   Endo/Heme/Allergies: Negative.   Psychiatric/Behavioral: Negative.      Family History  Problem Relation Age of Onset   Rheum arthritis Mother    Liver disease Father    Cancer Sister        Lung   Asthma Daughter    Allergic rhinitis Daughter  Social History   Socioeconomic History   Marital status: Married    Spouse name: Starleen Blue   Number of children: 2   Years of education: College   Highest education level: Not on file  Occupational History   Occupation: Retired Runner, broadcasting/film/video  Tobacco Use   Smoking status: Never    Passive exposure: Never   Smokeless tobacco: Never  Vaping Use   Vaping status: Never Used  Substance and Sexual Activity   Alcohol use: No   Drug use: No   Sexual activity: Not Currently  Other Topics Concern   Not on file  Social History Narrative   Pt lives at home with spouse in a one story home.  Has one daughter.  Retired Runner, broadcasting/film/video.  Education: college.    Caffeine Use: quit 09/2012   Patient is right handed   Social Drivers of Health   Financial Resource Strain: Not on file  Food Insecurity: No Food Insecurity (05/15/2023)   Hunger Vital Sign    Worried About Running Out of Food in the Last Year: Never true    Ran Out of Food in the Last Year: Never true  Transportation Needs: No Transportation Needs (05/15/2023)   PRAPARE - Administrator, Civil Service (Medical): No    Lack of Transportation (Non-Medical): No  Physical Activity: Inactive (05/15/2023)   Exercise Vital Sign    Days of Exercise per Week: 0 days    Minutes of Exercise per Session: 0 min  Stress: Not on file  Social Connections: Not on file  Intimate Partner Violence: Not on file    Environmental and Social history  Lives in a house  with a dry environment, no animals located inside the household, carpet in the bedroom, plastic on the bed, no plastic on the pillow, no smoking ongoing with inside the household.  Objective:   Vitals:   10/16/23 1011  BP: (!) 162/84  Pulse: 93  Resp: 18  Temp: 98 F (36.7 C)  SpO2: 97%   Height: 5\' 3"  (160 cm) Weight: 148 lb 11.2 oz (67.4 kg)  Physical Exam Constitutional:      Appearance: She is not diaphoretic.     Comments: Raspy voice. Throat clearing  HENT:     Head: Normocephalic.     Right Ear: Tympanic membrane, ear canal and external ear normal.     Left Ear: Tympanic membrane, ear canal and external ear normal.     Nose: Nose normal. No mucosal edema or rhinorrhea.     Mouth/Throat:     Pharynx: Uvula midline. No oropharyngeal exudate.  Eyes:     Conjunctiva/sclera: Conjunctivae normal.  Neck:     Thyroid: No thyromegaly.     Trachea: Trachea normal. No tracheal tenderness or tracheal deviation.  Cardiovascular:     Rate and Rhythm: Normal rate and regular rhythm.     Heart sounds: Normal heart sounds, S1 normal and S2 normal. No murmur heard. Pulmonary:     Effort: No respiratory distress.     Breath sounds: Normal breath sounds. No stridor. No wheezing or rales.  Lymphadenopathy:     Head:     Right side of head: No tonsillar adenopathy.     Left side of head: No tonsillar adenopathy.     Cervical: No cervical adenopathy.  Skin:    Findings: No erythema or rash.     Nails: There is no clubbing.  Neurological:     Mental Status: She is alert.  Diagnostics: Allergy skin tests were not performed.   Results of a chest CT angio obtained 23 July 2023 identifies the following:  Cardiovascular: No filling defects in the pulmonary arteries to suggest pulmonary emboli. Heart is normal size. Aorta is normal caliber. Scattered coronary artery and aortic calcifications.   Mediastinum/Nodes: No mediastinal, hilar, or axillary adenopathy. Trachea and  esophagus are unremarkable. Thyroid unremarkable. Small hiatal hernia.   Lungs/Pleura: Trace right pleural effusion. Minimal dependent and bibasilar atelectasis bilaterally. No confluent airspace opacities.  Results of a CT scan orbits obtained 31 August 2022 identifies the following:  Visualized sinuses: There is trace mucosal thickening in the left maxillary sinus.   Assessment and Plan:    No diagnosis found.  Patient Instructions   1. Treat and prevent LPR:   A. Lansoprazole 30 mg - 1 tablet 2 times per day  B. Famotidine 40 mg - 1 tablet in evening  C. Eliminate caffeine and chocolate consumption  D. Replace throat clearing with drinking / swallowing maneuver  2. Treat and prevent inflammation of upper airway:   A. OTC Nasacort - 1 spray each nostril 1 time per day  B. Loratadine 10 mg - 1 tablet 1-2 times per day  3. Further evaluation of L[place]nex with ENT???  4. Further evaluation for aspiration???  5. Influenza = Tamiflu. Covid = Paxlovid  6. Return to clinic in 4 weeks or earlier if problem   Erica Priest, MD Allergy / Immunology [place] [facility] [place]

## 2023-10-16 NOTE — Patient Instructions (Addendum)
  1. Treat and prevent LPR:   A. Lansoprazole 30 mg - 1 tablet 2 times per day  B. Famotidine 40 mg - 1 tablet in evening  C. Eliminate caffeine and chocolate consumption  D. Replace throat clearing with drinking / swallowing maneuver  2. Treat and prevent inflammation of upper airway:   A. OTC Nasacort - 1 spray each nostril 1 time per day  B. Loratadine 10 mg - 1 tablet 1-2 times per day  3. Further evaluation of [place] with ENT???  4. Further evaluation for aspiration???  5. Influenza = Tamiflu. Covid = Paxlovid  6. Return to clinic in 4 weeks or earlier if problem

## 2023-10-17 ENCOUNTER — Other Ambulatory Visit: Payer: Self-pay | Admitting: Cardiology

## 2023-10-17 ENCOUNTER — Other Ambulatory Visit: Payer: Self-pay

## 2023-10-17 ENCOUNTER — Encounter: Payer: Self-pay | Admitting: Allergy and Immunology

## 2023-10-17 DIAGNOSIS — E876 Hypokalemia: Secondary | ICD-10-CM

## 2023-10-17 DIAGNOSIS — I2510 Atherosclerotic heart disease of native coronary artery without angina pectoris: Secondary | ICD-10-CM

## 2023-10-17 MED ORDER — POTASSIUM CHLORIDE CRYS ER 20 MEQ PO TBCR: 20.0000 meq | EXTENDED_RELEASE_TABLET | ORAL | 2 refills | Status: DC

## 2023-10-23 ENCOUNTER — Ambulatory Visit: Payer: Medicare PPO | Admitting: Cardiology

## 2023-10-26 DIAGNOSIS — E039 Hypothyroidism, unspecified: Secondary | ICD-10-CM | POA: Diagnosis not present

## 2023-11-13 ENCOUNTER — Ambulatory Visit: Payer: Medicare PPO | Admitting: Allergy and Immunology

## 2023-11-13 ENCOUNTER — Encounter: Payer: Self-pay | Admitting: Allergy and Immunology

## 2023-11-13 ENCOUNTER — Other Ambulatory Visit: Payer: Self-pay

## 2023-11-13 VITALS — BP 142/78 | HR 71 | Temp 98.2°F | Resp 18 | Ht 63.0 in | Wt 147.8 lb

## 2023-11-13 DIAGNOSIS — J3089 Other allergic rhinitis: Secondary | ICD-10-CM

## 2023-11-13 DIAGNOSIS — K219 Gastro-esophageal reflux disease without esophagitis: Secondary | ICD-10-CM

## 2023-11-13 MED ORDER — FAMOTIDINE 40 MG PO TABS: 40.0000 mg | ORAL_TABLET | Freq: Every evening | ORAL | 1 refills | Status: DC

## 2023-11-13 MED ORDER — LANSOPRAZOLE 30 MG PO CPDR: 30.0000 mg | DELAYED_RELEASE_CAPSULE | Freq: Two times a day (BID) | ORAL | 1 refills | Status: DC

## 2023-11-13 MED ORDER — LORATADINE 10 MG PO TABS: 10.0000 mg | ORAL_TABLET | Freq: Every day | ORAL | 1 refills | Status: DC | PRN

## 2023-11-13 MED ORDER — TRIAMCINOLONE ACETONIDE 55 MCG/ACT NA AERO: 1.0000 | INHALATION_SPRAY | Freq: Every day | NASAL | 1 refills | Status: DC

## 2023-11-13 NOTE — Progress Notes (Unsigned)
 [place] - [place] - [place] - [place] [facility] [place]   Follow-up Note  Referring Provider: Merri Brunette, MD Primary Provider: Merri Brunette, MD Date of Office Visit: 11/13/2023  Subjective:   Erica Davidson (DOB: 23-Sep-1940) is a 83 y.o. female who [facility] in re-evaluation of the following:  HPI: Erica Davidson returns to this clinic in evaluation of LPR and allergic rhinitis.  I last saw her in this clinic during her initial evaluation of 16 October 2023.  She has had dramatic improvement regarding her throat clearing and raspy voice and something lodged in her throat and problems with drinking fluids with the development of choking.  All of those issues have resolved.  She has no problems with her nose.  She has eliminated the consumption of her chocolate almonds.  Allergies as of 11/13/2023       Reactions   Zithromax [azithromycin] Shortness Of Breath   Atenolol Other (See Comments)   Ciprofloxacin    REACTION: rash/itch   Cyclobenzaprine Other (See Comments)   Iodine Other (See Comments)   Doesn't remember what happened   Lisinopril Other (See Comments)   cough   Loratadine-pseudoephedrine Er    REACTION: tachycardia   Methocarbamol Other (See Comments)   Milnacipran Other (See Comments)   Doesn't remember what happens   Penicillins    Questionable allergy per pt   Prednisone    high dose taper - gave pt high anxiety, was able to take depo-medrol 80 mg    Solifenacin    Other Reaction(s): bloating/swelling   Codeine Palpitations   Rapid heart rate        Medication List    acetaminophen 500 MG tablet Commonly known as: TYLENOL Take 500 mg by mouth every 6 (six) hours as needed for moderate pain.   alendronate 70 MG tablet Commonly known as: FOSAMAX Take 70 mg by mouth once a week.   aspirin EC 81 MG tablet Take 1 tablet (81 mg total) by mouth 2 (two) times daily after a meal. For 2 weeks then 1 x a day  for 2 weeks for blood clot prevention.   famotidine 40 MG tablet Commonly known as: PEPCID Take 1 tablet (40 mg total) by mouth daily.   Gemtesa 75 MG Tabs Generic drug: Vibegron Take 1 tablet by mouth daily.   hydrochlorothiazide 12.5 MG capsule Commonly known as: MICROZIDE TAKE 1 CAPSULE BY MOUTH DAILY   lansoprazole 30 MG capsule Commonly known as: PREVACID Take 1 capsule (30 mg total) by mouth 2 (two) times daily.   levothyroxine 137 MCG tablet Commonly known as: SYNTHROID Take 137 mcg by mouth. 1 tablet in the morning on an empty stomach Orally Once a day   levothyroxine 125 MCG tablet Commonly known as: SYNTHROID Take 137 mcg by mouth every morning.   loratadine 10 MG tablet Commonly known as: Claritin Take 1 tablet (10 mg total) by mouth daily.   losartan 50 MG tablet Commonly known as: COZAAR Take 50 mg by mouth daily as needed (Take if SBP is > 150). In the evening   melatonin 1 MG Tabs tablet Take 2 mg by mouth at bedtime.   potassium chloride SA 20 MEQ tablet Commonly known as: KLOR-CON M Take 1 tablet (20 mEq total) by mouth every other day.   Refresh Optive Mega-3 0.5-1-0.5 % Soln Generic drug: Carboxymeth-Glyc-Polysorb PF Place 1 drop into both eyes in the morning and at bedtime.   Restasis 0.05 %  ophthalmic emulsion Generic drug: cycloSPORINE Place 1 drop into both eyes 2 (two) times daily.   rosuvastatin 20 MG tablet Commonly known as: CRESTOR Take 1 tablet (20 mg total) by mouth daily. KEEP OV.   sertraline 50 MG tablet Commonly known as: ZOLOFT Take 50 mg by mouth daily.   traMADol 50 MG tablet Commonly known as: ULTRAM Take 50 mg by mouth every 6 (six) hours as needed for severe pain.   Vitamin D 50 MCG (2000 UT) tablet Take 2,000 Units by mouth daily.    Past Medical History:  Diagnosis Date   Arthritis    Blood transfusion without reported diagnosis    Cataract    Colitis    Complication of anesthesia    trouble waking up    Coronary artery disease    mild   Depression    Diverticulosis    Encounter for Zostavax administration 02/10/2008   Family history of adverse reaction to anesthesia    Son has naseau and vomiting   Fibromyalgia    GERD (gastroesophageal reflux disease)    Hemorrhoids, internal    Hepatitis    Hep B from medication no longer an issue   Hypertension    Immunization, tetanus-diphtheria 10/05/2002   Low BP    Neuromuscular disorder (HCC)    low endurance   Pneumococcal vaccination given 01/02/2009   Pneumonia    Polio    Post-polio syndrome    Sleep apnea    Cpap   Ulcer     Past Surgical History:  Procedure Laterality Date   ABDOMINAL HYSTERECTOMY     APPENDECTOMY     CHOLECYSTECTOMY     EYE SURGERY Bilateral    cataract   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   Procedure: RIGHT TOTAL KNEE ARTHROPLASTY;  Surgeon: Marcene Corning, MD;  Location: [facility];  Service: Orthopedics;  Laterality: Right;    Review of systems negative except as noted in HPI / PMHx or noted below:  Review of Systems  Constitutional: Negative.   HENT: Negative.    Eyes: Negative.   Respiratory: Negative.    Cardiovascular: Negative.   Gastrointestinal: Negative.   Genitourinary: Negative.   Musculoskeletal: Negative.   Skin: Negative.   Neurological: Negative.   Endo/Heme/Allergies: Negative.   Psychiatric/Behavioral: Negative.       Objective:   Vitals:   11/13/23 1524  BP: (!) 142/78  Pulse: 71  Resp: 18  Temp: 98.2 F (36.8 C)  SpO2: 97%   Height: 5\' 3"  (160 cm)  Weight: 147 lb 12.8 oz (67 kg)   Physical Exam Constitutional:      Appearance: She is not diaphoretic.  HENT:     Head: Normocephalic.     Right Ear: Tympanic membrane, ear canal and external ear normal.     Left Ear: Tympanic membrane, ear canal and external ear normal.     Nose: Nose normal. No mucosal edema or rhinorrhea.     Mouth/Throat:     Pharynx: Uvula midline. No oropharyngeal exudate.  Eyes:      Conjunctiva/sclera: Conjunctivae normal.  Neck:     Thyroid: No thyromegaly.     Trachea: Trachea normal. No tracheal tenderness or tracheal deviation.  Cardiovascular:     Rate and Rhythm: Normal rate and regular rhythm.     Heart sounds: Normal heart sounds, S1 normal and S2 normal. No murmur heard. Pulmonary:     Effort: No respiratory distress.     Breath sounds: Normal breath sounds. No  stridor. No wheezing or rales.  Lymphadenopathy:     Head:     Right side of head: No tonsillar adenopathy.     Left side of head: No tonsillar adenopathy.     Cervical: No cervical adenopathy.  Skin:    Findings: No erythema or rash.     Nails: There is no clubbing.  Neurological:     Mental Status: She is alert.     Diagnostics: none  Assessment and Plan:   1. LPRD (laryngopharyngeal reflux disease)   2. Perennial allergic rhinitis    1. Treat and prevent LPR:   A. Lansoprazole 30 mg - 1 tablet 2 times per day  B. Famotidine 40 mg - 1 tablet in evening  C. Eliminate caffeine and chocolate consumption  D. Replace throat clearing with drinking / swallowing maneuver  2. Treat and prevent inflammation of upper airway:   A. OTC Nasacort - 1 spray each nostril 1 time per day  B. Loratadine 10 mg - 1 tablet 1-2 times per day  3. Influenza = Tamiflu. Covid = Paxlovid  4. Return to clinic in early June or earlier if problem  Erica Davidson appears to be doing very well and has had a very positive response to aggressive therapy directed against LPR and anti-inflammatory agents for her upper airway.  We are going to keep her on this plan for the next 12 weeks and I will see her back in this clinic at that point in time and we will make a determination about further evaluation and treatment pending her response.  I suspect we will be able to consolidate her medical therapy at that point.  Erica Schimke, MD Allergy / Immunology [facility]

## 2023-11-13 NOTE — Patient Instructions (Addendum)
  1. Treat and prevent LPR:   A. Lansoprazole 30 mg - 1 tablet 2 times per day  B. Famotidine 40 mg - 1 tablet in evening  C. Eliminate caffeine and chocolate consumption  D. Replace throat clearing with drinking / swallowing maneuver  2. Treat and prevent inflammation of upper airway:   A. OTC Nasacort - 1 spray each nostril 1 time per day  B. Loratadine 10 mg - 1 tablet 1-2 times per day  3. Influenza = Tamiflu. Covid = Paxlovid  4. Return to clinic in early June or earlier if problem

## 2023-11-14 ENCOUNTER — Encounter: Payer: Self-pay | Admitting: Allergy and Immunology

## 2023-11-27 DIAGNOSIS — L821 Other seborrheic keratosis: Secondary | ICD-10-CM | POA: Diagnosis not present

## 2023-12-11 ENCOUNTER — Ambulatory Visit: Admitting: Allergy and Immunology

## 2023-12-31 DIAGNOSIS — M25551 Pain in right hip: Secondary | ICD-10-CM | POA: Diagnosis not present

## 2024-01-17 ENCOUNTER — Other Ambulatory Visit: Payer: Self-pay

## 2024-01-17 DIAGNOSIS — I2510 Atherosclerotic heart disease of native coronary artery without angina pectoris: Secondary | ICD-10-CM

## 2024-01-17 MED ORDER — ROSUVASTATIN CALCIUM 20 MG PO TABS: 20.0000 mg | ORAL_TABLET | Freq: Every day | ORAL | 0 refills | Status: DC

## 2024-01-26 NOTE — Progress Notes (Signed)
 HPI:  Follow-up hypertension.  Previously followed by Dr. Rodolfo Clan.  Apparently had previous evaluation to exclude hyperaldosteronism and pheochromocytoma. Echocardiogram October 2017 showed normal LV function. Cardiac CTA November 2018 showed calcium  score of 29, less than 30% left main, less than 30% LAD. Aortic root mildly dilated at 38 mm. Renal Dopplers November 2018 showed no renal artery stenosis. Carotid Dopplers July 2024 showed no evidence of stenosis bilaterally.  Monitor July 2024 showed sinus rhythm with PACs, short runs of SVT and rare PVC.  CTA November 2024 showed no pulmonary embolus, coronary calcification noted.  Since last seen, there is dyspnea with more vigorous activities but not routine activities.  No orthopnea, PND, pedal edema or exertional chest pain.  She continues to have difficulties with low blood pressure at times.  She feels extremely weak when her systolic reaches 952.  Current Outpatient Medications  Medication Sig Dispense Refill   acetaminophen  (TYLENOL ) 500 MG tablet Take 500 mg by mouth every 6 (six) hours as needed for moderate pain.     alendronate (FOSAMAX) 70 MG tablet Take 70 mg by mouth once a week.     aspirin  EC 81 MG tablet Take 1 tablet (81 mg total) by mouth 2 (two) times daily after a meal. For 2 weeks then 1 x a day for 2 weeks for blood clot prevention. 45 tablet 0   Carboxymeth-Glyc-Polysorb PF (REFRESH OPTIVE MEGA-3) 0.5-1-0.5 % SOLN Place 1 drop into both eyes in the morning and at bedtime.     Cholecalciferol (VITAMIN D) 50 MCG (2000 UT) tablet Take 2,000 Units by mouth daily.     famotidine  (PEPCID ) 40 MG tablet Take 1 tablet (40 mg total) by mouth at bedtime. 90 tablet 1   GEMTESA 75 MG TABS Take 1 tablet by mouth daily.     hydrochlorothiazide  (MICROZIDE ) 12.5 MG capsule TAKE 1 CAPSULE BY MOUTH DAILY 90 capsule 3   lansoprazole  (PREVACID ) 30 MG capsule Take 1 capsule (30 mg total) by mouth 2 (two) times daily before a meal. 180 capsule 1    levothyroxine (SYNTHROID) 125 MCG tablet Take 137 mcg by mouth every morning.     levothyroxine (SYNTHROID) 137 MCG tablet Take 137 mcg by mouth. 1 tablet in the morning on an empty stomach Orally Once a day     loratadine  (CLARITIN ) 10 MG tablet Take 1 tablet (10 mg total) by mouth daily as needed for allergies (Can take an extra dose during flare ups.). 180 tablet 1   losartan  (COZAAR ) 50 MG tablet Take 50 mg by mouth daily as needed (Take if SBP is > 150). In the evening     melatonin 1 MG TABS tablet Take 2 mg by mouth at bedtime.     potassium chloride  SA (KLOR-CON  M) 20 MEQ tablet Take 1 tablet (20 mEq total) by mouth every other day. 45 tablet 2   RESTASIS 0.05 % ophthalmic emulsion Place 1 drop into both eyes 2 (two) times daily.     rosuvastatin  (CRESTOR ) 20 MG tablet Take 1 tablet (20 mg total) by mouth daily. KEEP OV. 90 tablet 0   sertraline (ZOLOFT) 50 MG tablet Take 50 mg by mouth daily.     traMADol  (ULTRAM ) 50 MG tablet Take 50 mg by mouth every 6 (six) hours as needed for severe pain.     triamcinolone  (NASACORT ) 55 MCG/ACT AERO nasal inhaler Place 1 spray into the nose daily. 50.7 mL 1   Polyvinyl Alcohol-Povidone PF 1.4-0.6 % SOLN  1 drop Ophthalmic twice a day     No current facility-administered medications for this visit.     Past Medical History:  Diagnosis Date   Arthritis    Blood transfusion without reported diagnosis    Cataract    Colitis    Complication of anesthesia    trouble waking up   Coronary artery disease    mild   Depression    Diverticulosis    Encounter for Zostavax administration 02/10/2008   Family history of adverse reaction to anesthesia    Son has naseau and vomiting   Fibromyalgia    GERD (gastroesophageal reflux disease)    Hemorrhoids, internal    Hepatitis    Hep B from medication no longer an issue   Hypertension    Immunization, tetanus-diphtheria 10/05/2002   Low BP    Neuromuscular disorder (HCC)    low endurance    Pneumococcal vaccination given 01/02/2009   Pneumonia    Polio    Post-polio syndrome    Sleep apnea    Cpap   Ulcer     Past Surgical History:  Procedure Laterality Date   ABDOMINAL HYSTERECTOMY     APPENDECTOMY     CHOLECYSTECTOMY     EYE SURGERY Bilateral    cataract   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   Procedure: RIGHT TOTAL KNEE ARTHROPLASTY;  Surgeon: Dayne Even, MD;  Location: [facility];  Service: Orthopedics;  Laterality: Right;    Social History   Socioeconomic History   Marital status: Married    Spouse name: Berline Brenner   Number of children: 2   Years of education: College   Highest education level: Not on file  Occupational History   Occupation: Retired Runner, broadcasting/film/video  Tobacco Use   Smoking status: Never    Passive exposure: Never   Smokeless tobacco: Never  Vaping Use   Vaping status: Never Used  Substance and Sexual Activity   Alcohol use: No   Drug use: No   Sexual activity: Not Currently  Other Topics Concern   Not on file  Social History Narrative   Pt lives at home with spouse in a one story home.  Has one daughter.  Retired Runner, broadcasting/film/video.  Education: college.    Caffeine Use: quit 09/2012   Patient is right handed   Social Drivers of Health   Financial Resource Strain: Not on file  Food Insecurity: No Food Insecurity (05/15/2023)   Hunger Vital Sign    Worried About Running Out of Food in the Last Year: Never true    Ran Out of Food in the Last Year: Never true  Transportation Needs: No Transportation Needs (05/15/2023)   PRAPARE - Administrator, Civil Service (Medical): No    Lack of Transportation (Non-Medical): No  Physical Activity: Inactive (05/15/2023)   Exercise Vital Sign    Days of Exercise per Week: 0 days    Minutes of Exercise per Session: 0 min  Stress: Not on file  Social Connections: Not on file  Intimate Partner Violence: Not on file    Family History  Problem Relation Age of Onset   Rheum arthritis Mother     Liver disease Father    Cancer Sister        Lung   Asthma Daughter    Allergic rhinitis Daughter     ROS: no fevers or chills, productive cough, hemoptysis, dysphasia, odynophagia, melena, hematochezia, dysuria, hematuria, rash, seizure activity, orthopnea, PND, pedal edema, claudication. Remaining systems  are negative.  Physical Exam: Well-developed well-nourished in no acute distress.  Skin is warm and dry.  HEENT is normal.  Neck is supple.  Chest is clear to auscultation with normal expansion.  Cardiovascular exam is regular rate and rhythm.  Abdominal exam nontender or distended. No masses palpated. Extremities show no edema. neuro grossly intact   A/P  1 coronary artery disease-patient denies exertional chest pain.  Continue aspirin  and statin.  2 labile hypertension-predominant issue for her is that she feels extremely weak when her systolic blood pressure reaches 105-110.  She takes her losartan  only when her systolic is above 140.  I have asked her to discontinue her HCTZ for now.  Follow blood pressure at home and take losartan  for systolic blood pressure greater than 160.  Hopefully allowing her blood pressure to run higher will improve her symptoms.  3 hyperlipidemia-continue statin.  4 history of palpitations  Erica Angel, MD

## 2024-02-08 ENCOUNTER — Ambulatory Visit: Payer: Medicare PPO | Attending: Cardiology | Admitting: Cardiology

## 2024-02-08 ENCOUNTER — Encounter: Payer: Self-pay | Admitting: Cardiology

## 2024-02-08 VITALS — BP 140/82 | HR 82 | Ht 63.0 in | Wt 150.6 lb

## 2024-02-08 DIAGNOSIS — E785 Hyperlipidemia, unspecified: Secondary | ICD-10-CM | POA: Diagnosis not present

## 2024-02-08 DIAGNOSIS — I2510 Atherosclerotic heart disease of native coronary artery without angina pectoris: Secondary | ICD-10-CM | POA: Diagnosis not present

## 2024-02-08 DIAGNOSIS — R0989 Other specified symptoms and signs involving the circulatory and respiratory systems: Secondary | ICD-10-CM | POA: Diagnosis not present

## 2024-02-08 NOTE — Patient Instructions (Signed)
 Medication Instructions:   STOP hydrochlorothiazide   *If you need a refill on your cardiac medications before your next appointment, please call your pharmacy*   Follow-Up: At [facility], you and your health needs are our priority.  As part of our continuing mission to provide you with exceptional heart care, our providers are all part of one team.  This team includes your primary Cardiologist (physician) and Advanced Practice Providers or APPs (Physician Assistants and Nurse Practitioners) who all work together to provide you with the care you need, when you need it.  Your next appointment:   6 month(s)  Provider:   Alexandria Angel, MD

## 2024-02-19 ENCOUNTER — Ambulatory Visit: Admitting: Allergy and Immunology

## 2024-02-19 VITALS — BP 138/88 | HR 76 | Temp 98.7°F | Resp 14 | Ht 63.0 in | Wt 155.5 lb

## 2024-02-19 DIAGNOSIS — K219 Gastro-esophageal reflux disease without esophagitis: Secondary | ICD-10-CM

## 2024-02-19 DIAGNOSIS — J3089 Other allergic rhinitis: Secondary | ICD-10-CM | POA: Diagnosis not present

## 2024-02-19 NOTE — Patient Instructions (Addendum)
  1. Treat and prevent LPR:   A. Lansoprazole  30 mg - 1 tablet 2 times per day  B. Famotidine  40 mg - 1 tablet in evening IF NEEDED  C. Eliminate caffeine and chocolate consumption  D. Replace throat clearing with drinking / swallowing maneuver  2. If needed:   A. OTC Nasacort  - 1 spray each nostril 1 time per day  B. Loratadine  10 mg - 1 tablet 1-2 times per day  3. Influenza = Tamiflu. Covid = Paxlovid  4. Return to clinic in 6 months or earlier if problem. Taper medications???

## 2024-02-19 NOTE — Progress Notes (Signed)
 [place] - [place] - [place] - [place] - [place]   Follow-up Note  Referring Provider: Faustina Hood, MD Primary Provider: Faustina Hood, MD Date of Office Visit: 02/19/2024  Subjective:   Erica Davidson (DOB: [birth year]) is a 83 y.o. female who [facility] in re-evaluation of the following:  HPI: Erica Davidson returns to this clinic in evaluation of LPR, allergic rhinitis.  I last saw her in this clinic 13 November 2023.  She continues to do very well with her LPR and has minimal throat clearing except sometimes in the morning and a raspy voice has improved significantly and overall she just feels as though she is doing very well while consistently using proton pump inhibitor and H2 receptor blocker.  She has had no problems with her upper airways and she has stopped her nasal steroid.  Allergies as of 02/19/2024       Reactions   Zithromax [azithromycin] Shortness Of Breath   Atenolol Other (See Comments)   Ciprofloxacin    REACTION: rash/itch   Cyclobenzaprine Other (See Comments)   Iodine  Other (See Comments)   Doesn't remember what happened   Lisinopril Other (See Comments)   cough   Loratadine -pseudoephedrine Er    REACTION: tachycardia   Methocarbamol  Other (See Comments)   Milnacipran Other (See Comments)   Doesn't remember what happens   Penicillins    Questionable allergy per pt   Prednisone     high dose taper - gave pt high anxiety, was able to take depo-medrol 80 mg    Solifenacin    Other Reaction(s): bloating/swelling   Codeine Palpitations   Rapid heart rate        Medication List    acetaminophen  500 MG tablet Commonly known as: TYLENOL  Take 500 mg by mouth every 6 (six) hours as needed for moderate pain.   alendronate 70 MG tablet Commonly known as: FOSAMAX Take 70 mg by mouth once a week.   aspirin  EC 81 MG tablet Take 1 tablet (81 mg total) by mouth 2 (two) times daily after a meal. For 2  weeks then 1 x a day for 2 weeks for blood clot prevention.   famotidine  40 MG tablet Commonly known as: PEPCID  Take 1 tablet (40 mg total) by mouth at bedtime.   Gemtesa 75 MG Tabs Generic drug: Vibegron Take 1 tablet by mouth daily.   lansoprazole  30 MG capsule Commonly known as: PREVACID  Take 1 capsule (30 mg total) by mouth 2 (two) times daily before a meal.   levothyroxine 137 MCG tablet Commonly known as: SYNTHROID Take 137 mcg by mouth. 1 tablet in the morning on an empty stomach Orally Once a day   levothyroxine 125 MCG tablet Commonly known as: SYNTHROID Take 137 mcg by mouth every morning.   loratadine  10 MG tablet Commonly known as: Claritin  Take 1 tablet (10 mg total) by mouth daily as needed for allergies (Can take an extra dose during flare ups.).   losartan  50 MG tablet Commonly known as: COZAAR  Take 50 mg by mouth daily as needed (Take if SBP is > 150). In the evening   melatonin 1 MG Tabs tablet Take 2 mg by mouth at bedtime.   Polyvinyl Alcohol-Povidone PF 1.4-0.6 % Soln 1 drop Ophthalmic twice a day   potassium chloride  SA 20 MEQ tablet Commonly known as: KLOR-CON  M Take 1 tablet (20 mEq total) by mouth every other day.   Refresh Optive Mega-3 0.5-1-0.5 % Soln  Generic drug: Carboxymeth-Glyc-Polysorb PF Place 1 drop into both eyes in the morning and at bedtime.   Restasis 0.05 % ophthalmic emulsion Generic drug: cycloSPORINE Place 1 drop into both eyes 2 (two) times daily.   rosuvastatin  20 MG tablet Commonly known as: CRESTOR  Take 1 tablet (20 mg total) by mouth daily. KEEP OV.   sertraline 50 MG tablet Commonly known as: ZOLOFT Take 50 mg by mouth daily.   traMADol  50 MG tablet Commonly known as: ULTRAM  Take 50 mg by mouth every 6 (six) hours as needed for severe pain.   triamcinolone  55 MCG/ACT Aero nasal inhaler Commonly known as: NASACORT  Place 1 spray into the nose daily.   Vitamin D 50 MCG (2000 UT) tablet Take 2,000 Units by  mouth daily.    Past Medical History:  Diagnosis Date   Arthritis    Blood transfusion without reported diagnosis    Cataract    Colitis    Complication of anesthesia    trouble waking up   Coronary artery disease    mild   Depression    Diverticulosis    Encounter for Zostavax administration 02/10/2008   Family history of adverse reaction to anesthesia    Son has naseau and vomiting   Fibromyalgia    GERD (gastroesophageal reflux disease)    Hemorrhoids, internal    Hepatitis    Hep B from medication no longer an issue   Hypertension    Immunization, tetanus-diphtheria 10/05/2002   Low BP    Neuromuscular disorder (HCC)    low endurance   Pneumococcal vaccination given 01/02/2009   Pneumonia    Polio    Post-polio syndrome    Sleep apnea    Cpap   Ulcer     Past Surgical History:  Procedure Laterality Date   ABDOMINAL HYSTERECTOMY     APPENDECTOMY     CHOLECYSTECTOMY     EYE SURGERY Bilateral    cataract   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   Procedure: RIGHT TOTAL KNEE ARTHROPLASTY;  Surgeon: Dayne Even, MD;  Location: [facility];  Service: Orthopedics;  Laterality: Right;    Review of systems negative except as noted in HPI / PMHx or noted below:  Review of Systems  Constitutional: Negative.   HENT: Negative.    Eyes: Negative.   Respiratory: Negative.    Cardiovascular: Negative.   Gastrointestinal: Negative.   Genitourinary: Negative.   Musculoskeletal: Negative.   Skin: Negative.   Neurological: Negative.   Endo/Heme/Allergies: Negative.   Psychiatric/Behavioral: Negative.       Objective:   Vitals:   02/19/24 1646  BP: 138/88  Pulse: 76  Resp: 14  Temp: 98.7 F (37.1 C)  SpO2: 97%   Height: 5' 3 (160 cm)  Weight: 155 lb 8 oz (70.5 kg)   Physical Exam Constitutional:      Appearance: She is not diaphoretic.  HENT:     Head: Normocephalic.     Comments: Bilateral hearing aids    Right Ear: Tympanic membrane, ear canal  and external ear normal.     Left Ear: Tympanic membrane, ear canal and external ear normal.     Nose: Nose normal. No mucosal edema or rhinorrhea.     Mouth/Throat:     Pharynx: Uvula midline. No oropharyngeal exudate.   Eyes:     Conjunctiva/sclera: Conjunctivae normal.   Neck:     Thyroid : No thyromegaly.     Trachea: Trachea normal. No tracheal tenderness or tracheal deviation.  Cardiovascular:     Rate and Rhythm: Normal rate and regular rhythm.     Heart sounds: Normal heart sounds, S1 normal and S2 normal. No murmur heard. Pulmonary:     Effort: No respiratory distress.     Breath sounds: Normal breath sounds. No stridor. No wheezing or rales.  Lymphadenopathy:     Head:     Right side of head: No tonsillar adenopathy.     Left side of head: No tonsillar adenopathy.     Cervical: No cervical adenopathy.   Skin:    Findings: No erythema or rash.     Nails: There is no clubbing.   Neurological:     Mental Status: She is alert.     Diagnostics: none  Assessment and Plan:   1. LPRD (laryngopharyngeal reflux disease)   2. Perennial allergic rhinitis    1. Treat and prevent LPR:   A. Lansoprazole  30 mg - 1 tablet 2 times per day  B. Famotidine  40 mg - 1 tablet in evening IF NEEDED  C. Eliminate caffeine and chocolate consumption  D. Replace throat clearing with drinking / swallowing maneuver  2. If needed:   A. OTC Nasacort  - 1 spray each nostril 1 time per day  B. Loratadine  10 mg - 1 tablet 1-2 times per day  3. Influenza = Tamiflu. Covid = Paxlovid  4. Return to clinic in 6 months or earlier if problem. Taper medications???  Mayari is doing quite well with her LPR and I think there is an opportunity to consolidate some of her therapy and we will have her eliminate use of famotidine  and only utilize this agent should it be required.  She can always restart her Nasacort  or loratadine  should she develop upper airway symptoms.  I will see her back in this  clinic in 6 months and there may be an opportunity at that point in time to further consolidate her treatment.  Schuyler Custard, MD Allergy / Immunology [facility]

## 2024-02-20 ENCOUNTER — Encounter: Payer: Self-pay | Admitting: Allergy and Immunology

## 2024-02-20 MED ORDER — LANSOPRAZOLE 30 MG PO CPDR: 30.0000 mg | DELAYED_RELEASE_CAPSULE | Freq: Two times a day (BID) | ORAL | 1 refills | Status: DC

## 2024-02-20 MED ORDER — LORATADINE 10 MG PO TABS: 10.0000 mg | ORAL_TABLET | Freq: Every day | ORAL | 1 refills | Status: DC | PRN

## 2024-02-20 MED ORDER — FAMOTIDINE 40 MG PO TABS: 40.0000 mg | ORAL_TABLET | Freq: Every evening | ORAL | 1 refills | Status: DC

## 2024-02-20 MED ORDER — TRIAMCINOLONE ACETONIDE 55 MCG/ACT NA AERO: 1.0000 | INHALATION_SPRAY | Freq: Every morning | NASAL | 1 refills | Status: AC

## 2024-02-20 NOTE — Addendum Note (Signed)
 Addended by: Anthon Baston A on: 02/20/2024 12:19 PM   Modules accepted: Orders

## 2024-02-22 ENCOUNTER — Telehealth: Payer: Self-pay | Admitting: Cardiology

## 2024-02-22 NOTE — Telephone Encounter (Signed)
 Pt c/o swelling/edema: STAT if pt has developed SOB within 24 hours  If swelling, where is the swelling located? Bi late LE  How much weight have you gained and in what time span? unsure  Have you gained 2 pounds in a day or 5 pounds in a week? unsure  Do you have a log of your daily weights (if so, list)? No  Are you currently taking a fluid pill? Was just taken off  Are you currently SOB? No  Have you traveled recently in a car or plane for an extended period of time? No  Also states that bp this morning was 202/105 and  yesterday 193/106

## 2024-02-22 NOTE — Telephone Encounter (Signed)
 Pt c/o swelling/edema: STAT if pt has developed SOB within 24 hours   If swelling, where is the swelling located? Bi late LE   How much weight have you gained and in what time span? unsure   Have you gained 2 pounds in a day or 5 pounds in a week? unsure   Do you have a log of your daily weights (if so, list)? No   Are you currently taking a fluid pill? Was just taken off   Are you currently SOB? No   Have you traveled recently in a car or plane for an extended period of time? No   Also states that bp this morning was 202/105 and  yesterday 193/106    Recently taken off of hydrochlorothiazide  12.5 mg once daily- 02/08/24 due to labile BP's   Today- Bp was 202/105- then took Losartan  50 mg= 168/93- She takes Losartan  50 mg prn for BP's 160 or greater.  She reports having to take the Losartan  every day since Tuesday- had a bad headache starting on Tuesday and Wednesday, but it is gone now.  She has been swelling since going off of hydrochlorothiazide  and BP's have increased. She is just wondering what needs to be done now. Does she need to go back on the hydrochlorothiazide  or try something different?

## 2024-02-25 DIAGNOSIS — R600 Localized edema: Secondary | ICD-10-CM | POA: Diagnosis not present

## 2024-02-25 DIAGNOSIS — I10 Essential (primary) hypertension: Secondary | ICD-10-CM | POA: Diagnosis not present

## 2024-02-26 MED ORDER — HYDROCHLOROTHIAZIDE 12.5 MG PO CAPS: 12.5000 mg | ORAL_CAPSULE | Freq: Every day | ORAL | Status: AC

## 2024-02-26 NOTE — Telephone Encounter (Signed)
 Spoke with pt, Aware of dr Ludwig Clarks recommendations.

## 2024-03-03 DIAGNOSIS — I10 Essential (primary) hypertension: Secondary | ICD-10-CM | POA: Diagnosis not present

## 2024-03-03 DIAGNOSIS — E039 Hypothyroidism, unspecified: Secondary | ICD-10-CM | POA: Diagnosis not present

## 2024-03-03 DIAGNOSIS — Z0000 Encounter for general adult medical examination without abnormal findings: Secondary | ICD-10-CM | POA: Diagnosis not present

## 2024-03-03 DIAGNOSIS — Z1331 Encounter for screening for depression: Secondary | ICD-10-CM | POA: Diagnosis not present

## 2024-03-03 DIAGNOSIS — F3342 Major depressive disorder, recurrent, in full remission: Secondary | ICD-10-CM | POA: Diagnosis not present

## 2024-03-03 DIAGNOSIS — G14 Postpolio syndrome: Secondary | ICD-10-CM | POA: Diagnosis not present

## 2024-03-03 DIAGNOSIS — E782 Mixed hyperlipidemia: Secondary | ICD-10-CM | POA: Diagnosis not present

## 2024-03-03 DIAGNOSIS — K219 Gastro-esophageal reflux disease without esophagitis: Secondary | ICD-10-CM | POA: Diagnosis not present

## 2024-03-03 DIAGNOSIS — M797 Fibromyalgia: Secondary | ICD-10-CM | POA: Diagnosis not present

## 2024-04-04 DIAGNOSIS — H524 Presbyopia: Secondary | ICD-10-CM | POA: Diagnosis not present

## 2024-04-04 DIAGNOSIS — H353131 Nonexudative age-related macular degeneration, bilateral, early dry stage: Secondary | ICD-10-CM | POA: Diagnosis not present

## 2024-04-04 DIAGNOSIS — H04123 Dry eye syndrome of bilateral lacrimal glands: Secondary | ICD-10-CM | POA: Diagnosis not present

## 2024-04-22 ENCOUNTER — Other Ambulatory Visit: Payer: Self-pay | Admitting: Cardiology

## 2024-04-22 DIAGNOSIS — I2510 Atherosclerotic heart disease of native coronary artery without angina pectoris: Secondary | ICD-10-CM

## 2024-06-10 DIAGNOSIS — Z1231 Encounter for screening mammogram for malignant neoplasm of breast: Secondary | ICD-10-CM | POA: Diagnosis not present

## 2024-07-03 DIAGNOSIS — Z23 Encounter for immunization: Secondary | ICD-10-CM | POA: Diagnosis not present

## 2024-08-19 ENCOUNTER — Other Ambulatory Visit: Payer: Self-pay

## 2024-08-19 ENCOUNTER — Ambulatory Visit: Admitting: Allergy and Immunology

## 2024-08-19 ENCOUNTER — Encounter (INDEPENDENT_AMBULATORY_CARE_PROVIDER_SITE_OTHER): Payer: Self-pay

## 2024-08-19 ENCOUNTER — Encounter: Payer: Self-pay | Admitting: Allergy and Immunology

## 2024-08-19 ENCOUNTER — Telehealth: Payer: Self-pay | Admitting: Allergy and Immunology

## 2024-08-19 VITALS — BP 128/80 | HR 71 | Temp 97.6°F | Resp 18 | Ht 63.0 in | Wt 154.4 lb

## 2024-08-19 DIAGNOSIS — R1310 Dysphagia, unspecified: Secondary | ICD-10-CM | POA: Diagnosis not present

## 2024-08-19 DIAGNOSIS — J3089 Other allergic rhinitis: Secondary | ICD-10-CM | POA: Diagnosis not present

## 2024-08-19 DIAGNOSIS — K219 Gastro-esophageal reflux disease without esophagitis: Secondary | ICD-10-CM | POA: Diagnosis not present

## 2024-08-19 MED ORDER — LANSOPRAZOLE 30 MG PO CPDR: 30.0000 mg | DELAYED_RELEASE_CAPSULE | Freq: Two times a day (BID) | ORAL | 1 refills | Status: AC

## 2024-08-19 MED ORDER — FAMOTIDINE 40 MG PO TABS: 40.0000 mg | ORAL_TABLET | Freq: Every evening | ORAL | 1 refills | Status: AC

## 2024-08-19 MED ORDER — LORATADINE 10 MG PO TABS: 10.0000 mg | ORAL_TABLET | Freq: Every day | ORAL | 1 refills | Status: AC | PRN

## 2024-08-19 NOTE — Patient Instructions (Addendum)
°  1. Treat and prevent LPR:   A. Lansoprazole  30 mg - 1 tablet 2 times per day  B. Famotidine  40 mg - 1 tablet in evening   C. Eliminate caffeine and chocolate consumption  D. Replace throat clearing with drinking / swallowing maneuver  2. If needed:   A. Loratadine  10 mg - 1 tablet 1-2 times per day  3. Evaluation of throat with ENT  4. Influenza = Tamiflu. Covid = Paxlovid  5. Return to clinic in 6 months or earlier if problem. Taper medications???

## 2024-08-19 NOTE — Telephone Encounter (Signed)
 Erica Davidson has been internally referred to [facility].  They will reach out to her to schedule.  I will follow up in a week.

## 2024-08-19 NOTE — Progress Notes (Unsigned)
 [place] - [place] - [place] - [place] - [place]   Follow-up Note  Referring Provider: Claudene Pellet, MD Primary Provider: Claudene Pellet, MD Date of Office Visit: 08/19/2024  Subjective:   Erica Davidson (DOB: Oct 07, 1940) is a 83 y.o. female who [facility]24 in re-evaluation of the following:  HPI: [name] returns to this clinic in reevaluation of LPR and allergic rhinitis.  I last saw her in this clinic 19 February 2024.  She continues to do well regarding her LPR and she still has a little bit of throat clearing upon awakening in the morning but otherwise feels as though she has this condition under good control while consistently using a proton pump inhibitor twice a day as well as famotidine  in the evening.  She has found that she does need to use the famotidine  at night or otherwise she develops significant throat clearing in the morning.  But she has had some changes that have occurred recently over the course of the past several months.  She has had trouble swallowing.  This trouble swallowing appears to be transition from the back of her throat further down.  It does not appear to be food that gets stuck in her chest.  It requires her to perform several swallows and then it can clear.  She has never had evaluation with ENT.  She has had a upper endoscopy performed over 5 years ago.  She has had no problems with her upper airway.  She rarely uses any nasal steroid.  She has not required an antibiotic or systemic steroid for any kind airway issue.  She has had this years flu vaccine.  She is now using alendronate for osteoporosis.  Allergies as of 08/19/2024       Reactions   Zithromax [azithromycin] Shortness Of Breath   Atenolol Other (See Comments)   Ciprofloxacin    REACTION: rash/itch   Cyclobenzaprine Other (See Comments)   Iodine  Other (See Comments)   Doesn't remember what happened   Lisinopril Other (See  Comments)   cough   Loratadine -pseudoephedrine Er    REACTION: tachycardia   Methocarbamol  Other (See Comments)   Milnacipran Other (See Comments)   Doesn't remember what happens   Penicillins    Questionable allergy per pt   Prednisone     high dose taper - gave pt high anxiety, was able to take depo-medrol 80 mg    Solifenacin    Other Reaction(s): bloating/swelling   Codeine Palpitations   Rapid heart rate        Medication List    alendronate 70 MG tablet Commonly known as: FOSAMAX Take 70 mg by mouth once a week.   aspirin  EC 81 MG tablet Take 1 tablet (81 mg total) by mouth 2 (two) times daily after a meal. For 2 weeks then 1 x a day for 2 weeks for blood clot prevention.   famotidine  40 MG tablet Commonly known as: PEPCID  Take 1 tablet (40 mg total) by mouth at bedtime.   Gemtesa 75 MG Tabs Generic drug: Vibegron Take 1 tablet by mouth daily.   hydrochlorothiazide  12.5 MG capsule Commonly known as: MICROZIDE  Take 1 capsule (12.5 mg total) by mouth daily.   lansoprazole  30 MG capsule Commonly known as: PREVACID  Take 1 capsule (30 mg total) by mouth 2 (two) times daily before a meal.   levothyroxine 137 MCG tablet Commonly known as: SYNTHROID Take 137 mcg by mouth. 1 tablet in the morning  on an empty stomach Orally Once a day   loratadine  10 MG tablet Commonly known as: Claritin  Take 1 tablet (10 mg total) by mouth daily as needed for allergies (Can take an extra dose during flare ups.).   losartan  50 MG tablet Commonly known as: COZAAR  Take 50 mg by mouth daily as needed (Take if SBP is > 150). In the evening   melatonin 1 MG Tabs tablet Take 2 mg by mouth at bedtime.   potassium chloride  SA 20 MEQ tablet Commonly known as: KLOR-CON  M Take 1 tablet (20 mEq total) by mouth every other day.   Refresh Optive Mega-3 0.5-1-0.5 % Soln Generic drug: Carboxymeth-Glyc-Polysorb PF Place 1 drop into both eyes in the morning and at bedtime.   Restasis 0.05  % ophthalmic emulsion Generic drug: cycloSPORINE Place 1 drop into both eyes 2 (two) times daily.   rosuvastatin  20 MG tablet Commonly known as: CRESTOR  TAKE 1 TABLET BY MOUTH DAILY   sertraline 50 MG tablet Commonly known as: ZOLOFT Take 50 mg by mouth daily.   traMADol  50 MG tablet Commonly known as: ULTRAM  Take 50 mg by mouth every 6 (six) hours as needed for severe pain.   triamcinolone  55 MCG/ACT Aero nasal inhaler Commonly known as: NASACORT  Place 1 spray into the nose every morning.   Vitamin D 50 MCG (2000 UT) tablet Take 2,000 Units by mouth daily.    Past Medical History:  Diagnosis Date   Arthritis    Blood transfusion without reported diagnosis    Cataract    Colitis    Complication of anesthesia    trouble waking up   Coronary artery disease    mild   Depression    Diverticulosis    Encounter for Zostavax administration 02/10/2008   Family history of adverse reaction to anesthesia    Son has naseau and vomiting   Fibromyalgia    GERD (gastroesophageal reflux disease)    Hemorrhoids, internal    Hepatitis    Hep B from medication no longer an issue   Hypertension    Immunization, tetanus-diphtheria 10/05/2002   Low BP    Neuromuscular disorder (HCC)    low endurance   Pneumococcal vaccination given 01/02/2009   Pneumonia    Polio    Post-polio syndrome (HCC)    Sleep apnea    Cpap   Ulcer     Past Surgical History:  Procedure Laterality Date   ABDOMINAL HYSTERECTOMY     APPENDECTOMY     CHOLECYSTECTOMY     EYE SURGERY Bilateral    cataract   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   Procedure: RIGHT TOTAL KNEE ARTHROPLASTY;  Surgeon: Sheril Coy, MD;  Location: [facility];  Service: Orthopedics;  Laterality: Right;    Review of systems negative except as noted in HPI / PMHx or noted below:  Review of Systems  Constitutional: Negative.   HENT: Negative.    Eyes: Negative.   Respiratory: Negative.    Cardiovascular: Negative.    Gastrointestinal: Negative.   Genitourinary: Negative.   Musculoskeletal: Negative.   Skin: Negative.   Neurological: Negative.   Endo/Heme/Allergies: Negative.   Psychiatric/Behavioral: Negative.       Objective:   Vitals:   08/19/24 1147  BP: 128/80  Pulse: 71  Resp: 18  Temp: 97.6 F (36.4 C)  SpO2: 97%   Height: 5' 3 (160 cm)  Weight: 154 lb 6.4 oz (70 kg)   Physical Exam Constitutional:      Appearance:  She is not diaphoretic.  HENT:     Head: Normocephalic.     Right Ear: Tympanic membrane, ear canal and external ear normal.     Left Ear: Tympanic membrane, ear canal and external ear normal.     Nose: Nose normal. No mucosal edema or rhinorrhea.     Mouth/Throat:     Pharynx: Uvula midline. No oropharyngeal exudate.  Eyes:     Conjunctiva/sclera: Conjunctivae normal.  Neck:     Thyroid : No thyromegaly.     Trachea: Trachea normal. No tracheal tenderness or tracheal deviation.  Cardiovascular:     Rate and Rhythm: Normal rate and regular rhythm.     Heart sounds: Normal heart sounds, S1 normal and S2 normal. No murmur heard. Pulmonary:     Effort: No respiratory distress.     Breath sounds: Normal breath sounds. No stridor. No wheezing or rales.  Lymphadenopathy:     Head:     Right side of head: No tonsillar adenopathy.     Left side of head: No tonsillar adenopathy.     Cervical: No cervical adenopathy.  Skin:    Findings: No erythema or rash.     Nails: There is no clubbing.  Neurological:     Mental Status: She is alert.     Diagnostics: none  Assessment and Plan:   1. LPRD (laryngopharyngeal reflux disease)   2. Perennial allergic rhinitis   3. Swallowing dysfunction    1. Treat and prevent LPR:   A. Lansoprazole  30 mg - 1 tablet 2 times per day  B. Famotidine  40 mg - 1 tablet in evening   C. Eliminate caffeine and chocolate consumption  D. Replace throat clearing with drinking / swallowing maneuver  2. If needed:   A. Loratadine   10 mg - 1 tablet 1-2 times per day  3. Evaluation of throat with ENT  4. Influenza = Tamiflu. Covid = Paxlovid  5. Return to clinic in 6 months or earlier if problem. Taper medications???  Sarayah will continue to treat her LPR and we will have her evaluated by ENT for she is having a little bit more problems with her laryngeal structures described as having problems swallowing with the problem defined as moving food from her throat further forward.  She is on alendronate at this point and maybe some of the irritation that she is developing in her throat is secondary to reflux and alendronate or maybe she is having a problem with her dry eye dry mouth or some other issue. I think the best for way to approach this issue is to visualize what is going on in that throat.  She will continue on a proton pump inhibitor and H2 receptor blocker.  She can use a antihistamine for her upper airway issue.  I will see her back in this clinic in 6 months.  Camellia Denis, MD Allergy / Immunology [facility]

## 2024-08-20 ENCOUNTER — Encounter: Payer: Self-pay | Admitting: Allergy and Immunology

## 2024-09-01 NOTE — Telephone Encounter (Signed)
 Erica Davidson has been scheduled with ENT for 09/23/24 at 3:40 pm with Dr. Roark.

## 2024-09-03 ENCOUNTER — Other Ambulatory Visit: Payer: Self-pay | Admitting: Cardiology

## 2024-09-03 DIAGNOSIS — E876 Hypokalemia: Secondary | ICD-10-CM

## 2024-09-23 ENCOUNTER — Encounter (INDEPENDENT_AMBULATORY_CARE_PROVIDER_SITE_OTHER): Payer: Self-pay | Admitting: Otolaryngology

## 2024-09-23 ENCOUNTER — Encounter (HOSPITAL_BASED_OUTPATIENT_CLINIC_OR_DEPARTMENT_OTHER): Payer: Self-pay | Admitting: Cardiovascular Disease

## 2024-09-23 ENCOUNTER — Ambulatory Visit (HOSPITAL_BASED_OUTPATIENT_CLINIC_OR_DEPARTMENT_OTHER): Admitting: Cardiovascular Disease

## 2024-09-23 ENCOUNTER — Ambulatory Visit (INDEPENDENT_AMBULATORY_CARE_PROVIDER_SITE_OTHER): Admitting: Otolaryngology

## 2024-09-23 VITALS — BP 166/70 | HR 77 | Ht 63.0 in | Wt 155.8 lb

## 2024-09-23 VITALS — BP 163/78 | HR 63 | Ht 63.0 in | Wt 155.8 lb

## 2024-09-23 DIAGNOSIS — K219 Gastro-esophageal reflux disease without esophagitis: Secondary | ICD-10-CM | POA: Diagnosis not present

## 2024-09-23 DIAGNOSIS — R1310 Dysphagia, unspecified: Secondary | ICD-10-CM

## 2024-09-23 DIAGNOSIS — G4733 Obstructive sleep apnea (adult) (pediatric): Secondary | ICD-10-CM

## 2024-09-23 DIAGNOSIS — I10 Essential (primary) hypertension: Secondary | ICD-10-CM | POA: Diagnosis not present

## 2024-09-23 DIAGNOSIS — R1312 Dysphagia, oropharyngeal phase: Secondary | ICD-10-CM

## 2024-09-23 MED ORDER — MIDODRINE HCL 2.5 MG PO TABS: 2.5000 mg | ORAL_TABLET | ORAL | 1 refills | Status: DC | PRN

## 2024-09-23 MED ORDER — MIDODRINE HCL 2.5 MG PO TABS: 2.5000 mg | ORAL_TABLET | ORAL | 1 refills | Status: AC | PRN

## 2024-09-23 NOTE — Progress Notes (Signed)
 Reason f [place] with esophageal problems.  Or Consult: Dysphagia Referring Physician: Dr. Kozlow  Erica Davidson is an 84 y.o. female.  HPI: History of reflux which is fairly severe.  She is on what she says 3 different medications for reflux and still has breakthrough episodes.  She has a feeling like intermittently she has things that hanging up when she swallows.  Sometimes food will come back up after swallowing.  She has not seen a GI doctor.  She does not have any pain.  Sometimes when she drinks only water it starts making her cough.  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  She did notice that the difficulty with swallowing started after a osteoporosis medication and she says one of the side effect  profiles is dysphagia and that you should not take the medicine with esophageal problems.  No nasal symptoms.  No hoarseness.  Past Medical History:  Diagnosis Date   Arthritis    Blood transfusion without reported diagnosis    Cataract    Colitis    Complication of anesthesia    trouble waking up   Coronary artery disease    mild   Depression    Diverticulosis    Encounter for Zostavax administration 02/10/2008   Family history of adverse reaction to anesthesia    Son has naseau and vomiting   Fibromyalgia    GERD (gastroesophageal reflux disease)    Hemorrhoids, internal    Hepatitis    Hep B from medication no longer an issue   Hyperlipidemia    Hypertension    Immunization, tetanus-diphtheria 10/05/2002   Low BP    Neuromuscular disorder (HCC)    low endurance   Pneumococcal vaccination given 01/02/2009   Pneumonia    Polio    Post-polio syndrome (HCC)    Sleep apnea    Cpap   Syncope and collapse    Thyroid  disease    Ulcer     Past Surgical History:  Procedure Laterality Date   ABDOMINAL HYSTERECTOMY     APPENDECTOMY     CHOLECYSTECTOMY     EYE SURGERY Bilateral    cataract   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    Procedure: RIGHT TOTAL KNEE ARTHROPLASTY;  Surgeon: Sheril Coy, MD;  Location: [facility];  Service: Orthopedics;  Laterality: Right;    Family History  Problem Relation Age of Onset   Rheum arthritis Mother    Heart attack Mother    Liver disease Father    Cancer Sister        Lung   Hyperlipidemia Brother    Hypertension Brother    Asthma Daughter    Allergic rhinitis Daughter    Asthma Sister    Hypertension Brother     Social History:  reports that she has never smoked. She has never been exposed to tobacco smoke. She has never used smokeless tobacco. She reports that she does not drink alcohol and does not use drugs.  Allergies: Allergies[1]   No results found for this or any previous visit (from the past 48 hours).  No results found.  ROS Blood pressure (!) 163/78, pulse 63, height 5' 3 (1.6 m), weight 155 lb 12.8 oz (70.7 kg), SpO2 96%. Physical Exam Constitutional:      Appearance: Normal appearance.  HENT:     Head: Normocephalic and atraumatic.     Right Ear: Tympanic membrane is without lesions and middle ear aerated, ear canal and  external ear normal.     Left Ear: Tympanic membrane is without lesions and middle ear aerated, ear canal and external ear normal.     Nose: Nose without deviation of septum.  Turbinates with mild hypertrophy, No significant swelling or masses.     Oral cavity/oropharynx: Mucous membranes are moist. No lesions or masses    Larynx: normal voice. Mirror attempted without success    Eyes:     Extraocular Movements: Extraocular movements intact.     Conjunctiva/sclera: Conjunctivae normal.     Pupils: Pupils are equal, round, and reactive to light.  Cardiovascular:     Rate and Rhythm: Normal rate.  Pulmonary:     Effort: Pulmonary effort is normal.  Musculoskeletal:     Cervical back: Normal range of motion and neck supple. No rigidity.  Lymphadenopathy:     Cervical: No cervical adenopathy or masses.salivary glands without  lesions. .     Salivary glands- no mass or swelling Neurological:     Mental Status: He is alert. CN 2-12 intact. No nystagmus      Assessment/Plan: Dysphagia-she has definite reflux and is maximized on treatment.  I think she needs to see a GI doctor for this.  She also needs a barium swallow based on the fact that she starts coughing after drinking water.  I think watching the mechanism would be helpful.  I do not have any specific recommendations for medication at this point as she is already maxed out on reflux treatment.  We talked about a fiberoptic exam but will get the 2 above studies first and then have her back for a look at her larynx if there is no findings or etiology for her current issue.  [name] Notice 09/23/2024, 4:12 PM        [1]  Allergies Allergen Reactions   Zithromax [Azithromycin] Shortness Of Breath   Atenolol Other (See Comments)   Ciprofloxacin     REACTION: rash/itch   Cyclobenzaprine Other (See Comments)   Iodine  Other (See Comments)    Doesn't remember what happened   Lisinopril Other (See Comments)    cough   Loratadine -Pseudoephedrine Er     REACTION: tachycardia   Methocarbamol  Other (See Comments)   Milnacipran Other (See Comments)    Doesn't remember what happens   Penicillins     Questionable allergy per pt   Prednisone      high dose taper - gave pt high anxiety, was able to take depo-medrol 80 mg    Solifenacin     Other Reaction(s): bloating/swelling   Codeine Palpitations    Rapid heart rate

## 2024-09-23 NOTE — Patient Instructions (Signed)
 Medication Instructions:  START MIDODRINE  2.5 MG AS NEEDED FOR BLOOD PRESSURE BELOW 100 OR IF YOU ARE FEELING POORLY   Labwork: NONE  Testing/Procedures: NONE  Follow-Up: 3 TO 4 MONTHS WITH CAITLIN W NP OR DR Paradise   If you need a refill on your cardiac medications before your next appointment, please call your pharmacy.

## 2024-09-23 NOTE — Progress Notes (Signed)
 "  [facility] Initial Assessment:    Date:  09/23/2024   ID:  Erica Davidson, DOB 04/10/1941, MRN [id number]  PCP:  Claudene Pellet, MD  Cardiologist:  Redell Shallow, MD  Nephrologist:  Referring MD: Claudene Pellet, MD   CC: Hypertension  History of Present Illness:    Erica Davidson is a 84 y.o. female with a hx of nonobstructive CAD, hypertension, hyperlipidemia,  here for followe up.  She was was first seen in [facility] 9/22024.   Initially seen by Dr. Fernande in 06/2017 for labile blood pressures ranging 100-220 systolic. Renal dopplers 07/2017 were negative for renal artery stenosis. Coronary CTA showed a coronary calcium  score of 29 with nonobstructive disease. Aortic root was mildly dilated at 38 mm. She was later evaluated by Dr. Shallow 08/25/2019 for hypotension associated with extreme fatigue. She was taking prn midodrine  at that time. At her visit with Barnie Hila, NP 02/2023, her blood pressure was 180/80. It was noted that she had a syncopal episode in late December 2023 and was found to have low TSH. In 12/2022 she developed weakness and palpitations associated with blood pressure 198/107, with improvement to 130/73 after taking prn losartan . Her blood pressure remained low for 4 days afterwards. From May to her visit with Barnie her blood pressures were labile ranging 105-200/60-110, and she was referred to the [facility]. Carotid dopplers 03/2023 were normal. She also wore a heart monitor showing 3 runs of SVT, longest lasting 37.8 seconds. Rare PACs and PVCs. She was seen by Dr. Shallow 05/02/2023, and blood pressure was 156/69 in the office. She noted mostly low pressures at home. HCTZ was discontinued.   At her visit 05/2023 she reproted struggling with labile blood pressure for years. Initially her blood pressure was hypertensive and had been managed on Losartan  for a long time until her BP became labile. In the past 6 months, her  blood pressure has been dropping low (100s-110s) every day associated with overwhelming fatigue and somnolence lasting for 2-3 hours. Usually occurs about 2 hours after she wakes up. She is unable to function during this time. Her hypotensive episodes used to be more sporadic but are now occurring daily. In the office her blood pressure is 177/80 initially, and 200/78 on manual recheck.  At her visit 05/2023 she was encouraged to wear compression socks and increase fluid intake.  She only takes losartan  when her systolic is over 859.  Discussed the use of AI scribe software for clinical note transcription with the patient, who gave verbal consent to proceed.  History of Present Illness Ms. Erica Davidson continues to struggle with fluctuating blood pressure.  She presents with episodes of severe sleepiness and weakness. She was referred back by Dr. Claudene due to concerns about her blood pressure fluctuations and sleep attacks.  She experiences significant fluctuations in blood pressure, with recent episodes of hypotension causing weakness and an inability to stay awake. These episodes occur two to three times a week and are often associated with low blood pressure readings.  She describes severe sleepiness, falling asleep during the day even while eating or drinking coffee. This occurs daily and is not always associated with low blood pressure, as it sometimes happens when her blood pressure is normal. She sleeps about seven hours at night.  During episodes of significant hypotension, she experiences whole-body spasms, with involuntary jerking of her arms. These spasms have become more frequent and are distressing.  She takes losartan  only when  her blood pressure is high, approximately one to two times a week, but experiences low blood pressure episodes even without taking it.  She has a history of post-polio syndrome, which she suspects may contribute to her symptoms. Knee surgery has made it difficult for  her to wear compression socks, although she has tried various sizes and types to reduce leg swelling.  Her fluid intake is low, about sixteen ounces of water daily, supplemented by decaf coffee. She experiences reflux when drinking water, discouraging her from increasing intake. Lighter compression socks have helped reduce swelling in her feet.  She engages in light physical activity, mainly stretching, but is limited by fatigue and weakness, particularly in her arms, which prevents her from holding a book to read.  Previous antihypertensives: losartan   Past Medical History:  Diagnosis Date   Arthritis    Blood transfusion without reported diagnosis    Cataract    Colitis    Complication of anesthesia    trouble waking up   Coronary artery disease    mild   Depression    Diverticulosis    Encounter for Zostavax administration 02/10/2008   Family history of adverse reaction to anesthesia    Son has naseau and vomiting   Fibromyalgia    GERD (gastroesophageal reflux disease)    Hemorrhoids, internal    Hepatitis    Hep B from medication no longer an issue   Hyperlipidemia    Hypertension    Immunization, tetanus-diphtheria 10/05/2002   Low BP    Neuromuscular disorder (HCC)    low endurance   Pneumococcal vaccination given 01/02/2009   Pneumonia    Polio    Post-polio syndrome (HCC)    Sleep apnea    Cpap   Syncope and collapse    Thyroid  disease    Ulcer     Past Surgical History:  Procedure Laterality Date   ABDOMINAL HYSTERECTOMY     APPENDECTOMY     CHOLECYSTECTOMY     EYE SURGERY Bilateral    cataract   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   Procedure: RIGHT TOTAL KNEE ARTHROPLASTY;  Surgeon: Sheril Coy, MD;  Location: [facility];  Service: Orthopedics;  Laterality: Right;    Current Medications: Current Meds  Medication Sig   alendronate (FOSAMAX) 70 MG tablet Take 70 mg by mouth once a week.   aspirin  EC 81 MG tablet Take 1 tablet (81 mg total) by  mouth 2 (two) times daily after a meal. For 2 weeks then 1 x a day for 2 weeks for blood clot prevention.   Carboxymeth-Glyc-Polysorb PF (REFRESH OPTIVE MEGA-3) 0.5-1-0.5 % SOLN Place 1 drop into both eyes in the morning and at bedtime.   Cholecalciferol (VITAMIN D) 50 MCG (2000 UT) tablet Take 2,000 Units by mouth daily.   famotidine  (PEPCID ) 40 MG tablet Take 1 tablet (40 mg total) by mouth at bedtime.   GEMTESA 75 MG TABS Take 1 tablet by mouth daily.   hydrochlorothiazide  (MICROZIDE ) 12.5 MG capsule Take 1 capsule (12.5 mg total) by mouth daily.   lansoprazole  (PREVACID ) 30 MG capsule Take 1 capsule (30 mg total) by mouth 2 (two) times daily before a meal.   levothyroxine (SYNTHROID) 137 MCG tablet Take 137 mcg by mouth. 1 tablet in the morning on an empty stomach Orally Once a day   loratadine  (CLARITIN ) 10 MG tablet Take 1 tablet (10 mg total) by mouth daily as needed for allergies (Can take an extra dose during flare ups.).  losartan  (COZAAR ) 50 MG tablet Take 50 mg by mouth daily as needed (Take if SBP is > 150). In the evening   melatonin 1 MG TABS tablet Take 2 mg by mouth at bedtime.   potassium chloride  SA (KLOR-CON  M) 20 MEQ tablet TAKE 1 TABLET BY MOUTH EVERY OTHER DAY   RESTASIS 0.05 % ophthalmic emulsion Place 1 drop into both eyes 2 (two) times daily.   rosuvastatin  (CRESTOR ) 20 MG tablet TAKE 1 TABLET BY MOUTH DAILY   sertraline (ZOLOFT) 50 MG tablet Take 50 mg by mouth daily.   traMADol  (ULTRAM ) 50 MG tablet Take 50 mg by mouth every 6 (six) hours as needed for severe pain.   triamcinolone  (NASACORT ) 55 MCG/ACT AERO nasal inhaler Place 1 spray into the nose every morning.   [DISCONTINUED] midodrine  (PROAMATINE ) 2.5 MG tablet Take 1 tablet (2.5 mg total) by mouth as needed (FOR BLOOD PRESSURE BELOW 130 OR FEELING POORLY).     Allergies:   Zithromax [azithromycin], Atenolol, Ciprofloxacin, Cyclobenzaprine, Iodine , Lisinopril, Loratadine -pseudoephedrine er, Methocarbamol ,  Milnacipran, Penicillins, Prednisone , Solifenacin, and Codeine   Social History   Socioeconomic History   Marital status: Married    Spouse name: Sherwood Lenis   Number of children: 2   Years of education: College   Highest education level: Not on file  Occupational History   Occupation: Retired runner, broadcasting/film/video  Tobacco Use   Smoking status: Never    Passive exposure: Never   Smokeless tobacco: Never  Vaping Use   Vaping status: Never Used  Substance and Sexual Activity   Alcohol use: No   Drug use: No   Sexual activity: Not Currently  Other Topics Concern   Not on file  Social History Narrative   Pt lives at home with spouse in a one story home.  Has one daughter.  Retired runner, broadcasting/film/video.  Education: college.    Caffeine Use: quit 09/2012   Patient is right handed   Social Drivers of Health   Tobacco Use: Low Risk (09/23/2024)   Patient History    Smoking Tobacco Use: Never    Smokeless Tobacco Use: Never    Passive Exposure: Never  Financial Resource Strain: Not on file  Food Insecurity: No Food Insecurity (05/15/2023)   Hunger Vital Sign    Worried About Running Out of Food in the Last Year: Never true    Ran Out of Food in the Last Year: Never true  Transportation Needs: No Transportation Needs (05/15/2023)   PRAPARE - Administrator, Civil Service (Medical): No    Lack of Transportation (Non-Medical): No  Physical Activity: Inactive (05/15/2023)   Exercise Vital Sign    Days of Exercise per Week: 0 days    Minutes of Exercise per Session: 0 min  Stress: Not on file  Social Connections: Not on file  Depression (EYV7-0): Not on file  Alcohol Screen: Low Risk (05/15/2023)   Alcohol Screen    Last Alcohol Screening Score (AUDIT): 0  Housing: Low Risk (05/15/2023)   Housing    Last Housing Risk Score: 0  Utilities: Not At Risk (05/15/2023)   AHC Utilities    Threatened with loss of utilities: No  Health Literacy: Not on file     Family History: The patient's family  history includes Allergic rhinitis in her daughter; Asthma in her daughter and sister; Cancer in her sister; Heart attack in her mother; Hyperlipidemia in her brother; Hypertension in her brother and brother; Liver disease in her father; Rheum arthritis in  her mother.  ROS:   Please see the history of present illness.    (+) Fatigue/Malaise (+) Somnolence (+) Easy bleeding All other systems reviewed and are negative.  EKGs/Labs/Other Studies Reviewed:    Monitor  03/2023: Patch Wear Time:  1 days and 14 hours (2024-06-28T13:24:13-0400 to 2024-06-30T03:35:01-399)   Patient had a min HR of 52 bpm, max HR of 122 bpm, and avg HR of 77 bpm. Predominant underlying rhythm was Sinus Rhythm. 3 Supraventricular Tachycardia runs occurred, the run with the fastest interval lasting 5 beats with a max rate of 122 bpm, the  longest lasting 37.8 secs with an avg rate of 97 bpm. Isolated SVEs were rare (<1.0%), SVE Triplets were rare (<1.0%), and no SVE Couplets were present. Isolated VEs were rare (<1.0%), and no VE Couplets or VE Triplets were present.    Sinus bradycardia, NSR, sinus tachycardia, pacs, short runs of SVT and rare PVC.  Bilateral Carotid Dopplers  03/06/2023: Summary:  Right Carotid: There is no evidence of stenosis in the right ICA. The extracranial vessels were near-normal with only minimal wall thickening or plaque.   Left Carotid: There is no evidence of stenosis in the left ICA. There was no evidence of thrombus, dissection, atherosclerotic plaque or stenosis in the cervical carotid system.   Vertebrals:  Bilateral vertebral arteries demonstrate antegrade flow.  Subclavians: Normal flow hemodynamics were seen in bilateral subclavian arteries.   EKG:  EKG is personally reviewed. 05/15/2023: Not ordered.  EKG Interpretation Date/Time:  Tuesday September 23 2024 09:30:24 EST Ventricular Rate:  77 PR Interval:  146 QRS Duration:  88 QT Interval:  410 QTC Calculation: 463 R  Axis:   56  Text Interpretation: Normal sinus rhythm Normal ECG No significant change since last tracing Confirmed by Raford Riggs ([id number]) on 09/23/2024 9:43:20 AM          Recent Labs: No results found for requested labs within last 365 days.   Recent Lipid Panel    Component Value Date/Time   CHOL 159 10/27/2020 1218   TRIG 75 10/27/2020 1218   HDL 81 10/27/2020 1218   CHOLHDL 2.0 10/27/2020 1218   LDLCALC 64 10/27/2020 1218    Physical Exam:    VS:  BP (!) 166/70   Pulse 77   Ht 5' 3 (1.6 m)   Wt 155 lb 12.8 oz (70.7 kg)   SpO2 97%   BMI 27.60 kg/m  , BMI Body mass index is 27.6 kg/m. GENERAL:  Well appearing HEENT: Pupils equal round and reactive, fundi not visualized, oral mucosa unremarkable NECK:  No jugular venous distention, waveform within normal limits, carotid upstroke brisk and symmetric, no bruits, no thyromegaly LUNGS:  Clear to auscultation bilaterally HEART:  RRR.  PMI not displaced or sustained, S1 and S2 within normal limits, no S3, no S4, no clicks, no rubs, no murmurs ABD:  Flat, positive bowel sounds normal in frequency in pitch, no bruits, no rebound, no guarding, no midline pulsatile mass, no hepatomegaly, no splenomegaly EXT:  2 plus pulses throughout, 1+ LE edema, no cyanosis, no clubbing SKIN:  No rashes, no nodules NEURO:  Cranial nerves II through XII grossly intact, motor grossly intact throughout PSYCH:  Cognitively intact, oriented to person place and time   ASSESSMENT/PLAN:     Assessment & Plan # Dysautonomia with labile blood pressure Fluctuating blood pressure with hypotension and hypertension. Symptoms worsened. Losartan  ineffective. Compression socks difficult post-knee surgery. Low fluid intake may contribute to hypotension. Likely nervous system  dysregulation. - Prescribed midodrine  2.5 mg PRN for hypotension with SBP <100 mmHg or feeling poorly. - Ordered 24-hour BP monitor to assess variability.  She would like to hold  off on this given that her symptoms are not daily.  - Advised increasing fluid intake to 2-3 L/day, using non-water fluids if possible. - Continue losartan  only if SBP >160 mmHg at home. - Encouraged use of lighter weight compression socks if possible.  # Hyperlipidemia: Continue rosuvastatin .   Screening for Secondary Hypertension:     Relevant Labs/Studies:    Latest Ref Rng & Units 07/23/2023    1:40 PM 12/07/2021    2:26 PM 10/24/2021   12:45 PM  Basic Labs  Sodium 135 - 145 mmol/L 137  135  143   Potassium 3.5 - 5.1 mmol/L 3.3  2.8  4.3   Creatinine 0.44 - 1.00 mg/dL 9.22  9.28  9.22        Latest Ref Rng & Units 12/25/2017    6:44 AM  Thyroid    TSH 0.350 - 4.500 uIU/mL 0.577                 07/19/2017   11:39 AM  Renovascular   Renal Artery US  Completed Yes   Disposition:    FU in [facility] in 3-4 months   Medication Adjustments/Labs and Tests Ordered: Current medicines are reviewed at length with the patient today.  Concerns regarding medicines are outlined above.   Orders Placed This Encounter  Procedures   EKG 12-Lead   Meds ordered this encounter  Medications   DISCONTD: midodrine  (PROAMATINE ) 2.5 MG tablet    Sig: Take 1 tablet (2.5 mg total) by mouth as needed (FOR BLOOD PRESSURE BELOW 130 OR FEELING POORLY).    Dispense:  30 tablet    Refill:  1   midodrine  (PROAMATINE ) 2.5 MG tablet    Sig: Take 1 tablet (2.5 mg total) by mouth as needed (FOR BLOOD PRESSURE BELOW 100 OR FEELING POORLY).    Dispense:  30 tablet    Refill:  1    D/C PREVIOUS RX     Signed, Annabella Scarce, MD  09/23/2024 2:46 PM    [facility]  "

## 2024-12-17 ENCOUNTER — Encounter (HOSPITAL_BASED_OUTPATIENT_CLINIC_OR_DEPARTMENT_OTHER): Admitting: Cardiovascular Disease

## 2025-01-07 ENCOUNTER — Encounter (HOSPITAL_BASED_OUTPATIENT_CLINIC_OR_DEPARTMENT_OTHER): Admitting: Cardiovascular Disease

## 2025-02-17 ENCOUNTER — Ambulatory Visit: Admitting: Allergy and Immunology
